# Patient Record
Sex: Female | Born: 1940
Health system: Southern US, Community
[De-identification: ages and names within clinical notes are randomized; demographics above are authoritative.]

## PROBLEM LIST (undated history)

## (undated) DIAGNOSIS — M1990 Unspecified osteoarthritis, unspecified site: Secondary | ICD-10-CM

## (undated) DIAGNOSIS — H269 Unspecified cataract: Secondary | ICD-10-CM

## (undated) DIAGNOSIS — M8580 Other specified disorders of bone density and structure, unspecified site: Secondary | ICD-10-CM

## (undated) DIAGNOSIS — J189 Pneumonia, unspecified organism: Secondary | ICD-10-CM

## (undated) DIAGNOSIS — T8859XA Other complications of anesthesia, initial encounter: Secondary | ICD-10-CM

## (undated) DIAGNOSIS — I10 Essential (primary) hypertension: Secondary | ICD-10-CM

## (undated) DIAGNOSIS — G459 Transient cerebral ischemic attack, unspecified: Secondary | ICD-10-CM

## (undated) DIAGNOSIS — T7840XA Allergy, unspecified, initial encounter: Secondary | ICD-10-CM

## (undated) DIAGNOSIS — Z8669 Personal history of other diseases of the nervous system and sense organs: Secondary | ICD-10-CM

## (undated) DIAGNOSIS — R011 Cardiac murmur, unspecified: Secondary | ICD-10-CM

## (undated) DIAGNOSIS — E785 Hyperlipidemia, unspecified: Secondary | ICD-10-CM

## (undated) DIAGNOSIS — T4145XA Adverse effect of unspecified anesthetic, initial encounter: Secondary | ICD-10-CM

## (undated) HISTORY — DX: Essential (primary) hypertension: I10

## (undated) HISTORY — DX: Unspecified osteoarthritis, unspecified site: M19.90

## (undated) HISTORY — DX: Other specified disorders of bone density and structure, unspecified site: M85.80

## (undated) HISTORY — PX: COLONOSCOPY: SHX174

## (undated) HISTORY — PX: TONSILLECTOMY: SUR1361

## (undated) HISTORY — PX: APPENDECTOMY: SHX54

## (undated) HISTORY — DX: Unspecified cataract: H26.9

## (undated) HISTORY — PX: BACK SURGERY: SHX140

## (undated) HISTORY — DX: Allergy, unspecified, initial encounter: T78.40XA

## (undated) HISTORY — PX: TUBAL LIGATION: SHX77

## (undated) HISTORY — DX: Hyperlipidemia, unspecified: E78.5

## (undated) HISTORY — PX: DILATION AND CURETTAGE OF UTERUS: SHX78

---

## 1973-06-02 HISTORY — PX: LUMBAR DISC SURGERY: SHX700

## 2005-06-20 ENCOUNTER — Ambulatory Visit: Payer: Self-pay | Admitting: Internal Medicine

## 2005-07-04 ENCOUNTER — Emergency Department (HOSPITAL_COMMUNITY): Admission: EM | Admit: 2005-07-04 | Discharge: 2005-07-04 | Payer: Self-pay | Admitting: Emergency Medicine

## 2005-07-10 ENCOUNTER — Ambulatory Visit: Payer: Self-pay | Admitting: Internal Medicine

## 2005-08-05 ENCOUNTER — Ambulatory Visit: Payer: Self-pay | Admitting: Internal Medicine

## 2005-08-05 ENCOUNTER — Ambulatory Visit (HOSPITAL_COMMUNITY): Admission: RE | Admit: 2005-08-05 | Discharge: 2005-08-05 | Payer: Self-pay | Admitting: Internal Medicine

## 2007-03-15 ENCOUNTER — Ambulatory Visit: Payer: Self-pay | Admitting: Gastroenterology

## 2010-10-18 NOTE — Op Note (Signed)
Andrea Moreno, Andrea Moreno             ACCOUNT NO.:  [id number]   MEDICAL RECORD NO.:  [id number]          PATIENT TYPE:  AMB   LOCATION:  DAY                           FACILITY:  [facility]   PHYSICIAN:  R. Roetta Sessions, M.D. DATE OF BIRTH:  1940-07-04   DATE OF PROCEDURE:  08/05/2005  DATE OF DISCHARGE:                                 OPERATIVE REPORT   PROCEDURE:  Screening colonoscopy.   ENDOSCOPIST:  Gerrit Friends. Rourk, M.D.   INDICATIONS FOR PROCEDURE:  The patient is a 70 year old lady who is here  for colorectal cancer screening. It has been 17 years since her last  colonoscopy.  She has no GI symptoms currently.  There is no family history  of colorectal neoplasia.  Colonoscopy is now being done.  This approach has  been discussed with the patient at length.  The potential risks, benefits,  and alternatives have been reviewed; questions answered.  Please see the  documentation in the medical record.   PROCEDURE NOTE:  O2 saturation, blood pressure, pulse and respirations were  monitored throughout the entire procedure.   CONSCIOUS SEDATION:  Versed 6 mg IV, Fentanyl 100 mcg IV in divided doses.  SB prophylaxis:  Gentamicin 100 mg IV, ampicillin 2 gm IV.   INSTRUMENT:  Olympus video chip system.   FINDINGS:  Digital rectal exam revealed no abnormalities.   ENDOSCOPIC FINDINGS:  The prep was adequate.   RECTUM:  Examination of the rectal mucosa including the retroflex view of  the anal verge revealed only a couple of anal papillae.   COLON:  The colonic mucosa was surveyed from the rectosigmoid junction  through the left transverse and right colon to the area of the appendiceal  orifice, ileocecal valve, and cecum.  These structures were well seen and  photographed for the record.   From this level the scope was slowly withdrawn; and all previously mentioned  mucosal surfaces were again seen. The colonic mucosa appeared entirely  normal.  The colon was elongated and tortuous  requiring a number of  maneuvers including changing of the patient's position, external abdominal  pressure, to reach the cecum.   The patient tolerated the procedure well and was reacted in endoscopy.   IMPRESSION:  1.  Anal papillae, otherwise normal rectum.  2.  Long, tortuous, but otherwise normal appearing colon.   RECOMMENDATIONS:  Repeat colonoscopy in 10 years.      Jonathon Bellows, M.D.  Electronically Signed     RMR/MEDQ  D:  08/05/2005  T:  08/06/2005  Job:  [id number]   cc:   Magnus Sinning. Rice, M.D.  Fax: [phone]

## 2010-10-18 NOTE — H&P (Signed)
NAMEANAROSA, KUBISIAK             ACCOUNT NO.:  [id number]   MEDICAL RECORD NO.:  [id number]          PATIENT TYPE:  AMB   LOCATION:  DAY                           FACILITY:  [facility]   PHYSICIAN:  R. Roetta Sessions, M.D. DATE OF BIRTH:  22-Dec-1940   DATE OF ADMISSION:  DATE OF DISCHARGE:  LH                                HISTORY & PHYSICAL   CHIEF COMPLAINT:  Recent gastroenteritis. Overdue for colorectal cancer  screening.   Ms. Braiden Presutti. Arriaga is a 70 year old Caucasian female who was really  doing well from a GI standpoint until the past several weeks when she was  given Z-pack and subsequently Cipro for early pneumonia for which she  subsequently developed nausea, vomiting and nonbloody diarrhea. Stool  cultures revealed pseudomonas aeruginosa in the culture. She was getting  better last week but precipitously got worse with worsening nausea, vomiting  and diarrhea on February 2 which precipitated emergency department visit at  [facility]. She saw Dr. Neale Burly. He worked her up. CT reportedly was  unrevealing for any pathology. She was given some IV fluids and Reglan. Has  had a marked turn around in her symptoms over the past couple of days. She  says she is now back to baseline. Although she feels week, she is having  formed bowel movements. No nausea or vomiting in nearly a week. It is felt  that she may have had antibiotic-associated gastroenteritis early on and  perhaps contracted a different infection such as a Norovirus last week. At  any rate, she is doing much better. She had a colonoscopy in 1990 by Dr.  Andres Ege in [place], [place]. She had no significant findings.  There is no family history of colorectal neoplasia. She is due for  screening.   PAST MEDICAL HISTORY:  1.  Significant for hypertension.  2.  Allergies.  3.  Osteopenia.  4.  Osteoarthritis.  5.  Hyperlipidemia.  6.  History of rheumatoid fever requiring SB prophylaxis for procedures.   PAST SURGICAL HISTORY:  1.  Tonsillectomy.  2.  Appendectomy.  3.  Tubal ligation.  4.  Lumbar surgery.   CURRENT MEDICATIONS:  1.  Vitamin D daily.  2.  Multivitamin daily.  3.  Milk thistle daily.  4.  Triamterene hydrochlorothiazide 37.5/25 mg daily.  5.  Labetalol 200 mg daily.  6.  Red yeast rice 2 tablets daily.   ALLERGIES:  DEMEROL. It causes hypotension.   FAMILY HISTORY:  Mother with renal failure, diabetes and heart disease.  Father died with pneumonia. No history of chronic GI or liver illness.   SOCIAL HISTORY:  The patient is divorced with no children. She retired from  [employer]. She was an occupational therapist. She previously worked  at [employer]. She lives in [place], [place].   She is from [place] and worked in [place] a good part of her life. She  drinks a malt liquor beverage every evening. She does not smoke. No other  history of alcohol or illicit drug use.   REVIEW OF SYSTEMS:  No history of chest pain, dyspnea  on exertion. No fever  or chills. GI symptoms as reviewed above.   PHYSICAL EXAMINATION:  GENERAL:  Reveals a pleasant 71 year old lady resting  comfortably.  VITAL SIGNS:  Weight 160, height 5 foot 2, temperature 97.6, blood pressure  134/82, pulse 60.  SKIN:  Warm and dry. There is no jaundice. No continuous stigmata of chronic  liver disease.  HEENT:  No scleral icterus.  NECK:  JVD is not prominent.  CHEST:  Lungs are clear to auscultation.  CARDIAC:  Regular rate and rhythm without murmurs, gallops, or rubs.  BREASTS:  Deferred.  ABDOMEN:  Nondistended. Positive bowel sounds. Soft, nontender without  appreciable mass or organomegaly.  EXTREMITIES:  No edema.  RECTAL:  Deferred until the time of colonoscopy.   IMPRESSION:  Ms. Adileny Delon. Montrose is a pleasant 70 year old lady with  recent protracted gastrointestinal illness characterized by nausea, vomiting  and diarrhea. I suspect that antibiotic  therapy disturbed her normal flora,  and she ended up with antibiotic-associated illness, exacerbation of  symptoms, prior resolution, last week may well have been a superimposed  viral infection (query Norovirus). At any rate, clinically, she is  significantly improved, and the above-mentioned GI symptoms essentially  resolved. Issue has been raised about screening colonoscopy at 64. Her last  colonoscopy was some 17 years ago. Therefore, she is due, in fact overdue  for a screening colonoscopy. To this end, I have offered Ms. Vonruden a  colonoscopy. Potential risks, benefits, and alternatives have been reviewed.  I would like to give her a few more weeks to fully recover from her recent  protracted illness prior to performing the procedure. Questions were  answered. She is agreeable. We will give her SB prophylactic antibiotics and  keep in mind that she is allergic to Demerol.   I would like to thank Dr. Montey Hora for allowing me to see this nice  lady.      Jonathon Bellows, M.D.  Electronically Signed    RMR/MEDQ  D:  07/10/2005  T:  07/10/2005  Job:  [id number]   cc:   Magnus Sinning. Rice, M.D.  Fax: [phone]

## 2011-09-24 DIAGNOSIS — R609 Edema, unspecified: Secondary | ICD-10-CM | POA: Diagnosis not present

## 2011-09-24 DIAGNOSIS — I10 Essential (primary) hypertension: Secondary | ICD-10-CM | POA: Diagnosis not present

## 2011-09-24 DIAGNOSIS — E785 Hyperlipidemia, unspecified: Secondary | ICD-10-CM | POA: Diagnosis not present

## 2011-10-13 DIAGNOSIS — R7989 Other specified abnormal findings of blood chemistry: Secondary | ICD-10-CM | POA: Diagnosis not present

## 2011-10-13 DIAGNOSIS — I10 Essential (primary) hypertension: Secondary | ICD-10-CM | POA: Diagnosis not present

## 2011-12-16 DIAGNOSIS — M6530 Trigger finger, unspecified finger: Secondary | ICD-10-CM | POA: Diagnosis not present

## 2012-01-13 DIAGNOSIS — M6530 Trigger finger, unspecified finger: Secondary | ICD-10-CM | POA: Diagnosis not present

## 2012-02-04 DIAGNOSIS — I10 Essential (primary) hypertension: Secondary | ICD-10-CM | POA: Diagnosis not present

## 2012-02-04 DIAGNOSIS — R5383 Other fatigue: Secondary | ICD-10-CM | POA: Diagnosis not present

## 2012-02-04 DIAGNOSIS — E559 Vitamin D deficiency, unspecified: Secondary | ICD-10-CM | POA: Diagnosis not present

## 2012-02-04 DIAGNOSIS — E785 Hyperlipidemia, unspecified: Secondary | ICD-10-CM | POA: Diagnosis not present

## 2012-02-04 DIAGNOSIS — R5381 Other malaise: Secondary | ICD-10-CM | POA: Diagnosis not present

## 2012-02-16 DIAGNOSIS — E785 Hyperlipidemia, unspecified: Secondary | ICD-10-CM | POA: Diagnosis not present

## 2012-02-16 DIAGNOSIS — M25559 Pain in unspecified hip: Secondary | ICD-10-CM | POA: Diagnosis not present

## 2012-03-15 DIAGNOSIS — Z23 Encounter for immunization: Secondary | ICD-10-CM | POA: Diagnosis not present

## 2012-04-12 DIAGNOSIS — M25559 Pain in unspecified hip: Secondary | ICD-10-CM | POA: Diagnosis not present

## 2012-04-12 DIAGNOSIS — M5137 Other intervertebral disc degeneration, lumbosacral region: Secondary | ICD-10-CM | POA: Diagnosis not present

## 2012-04-12 DIAGNOSIS — M19049 Primary osteoarthritis, unspecified hand: Secondary | ICD-10-CM | POA: Diagnosis not present

## 2012-04-12 DIAGNOSIS — M51379 Other intervertebral disc degeneration, lumbosacral region without mention of lumbar back pain or lower extremity pain: Secondary | ICD-10-CM | POA: Diagnosis not present

## 2012-04-20 DIAGNOSIS — M25559 Pain in unspecified hip: Secondary | ICD-10-CM | POA: Diagnosis not present

## 2012-04-20 DIAGNOSIS — M545 Low back pain: Secondary | ICD-10-CM | POA: Diagnosis not present

## 2012-04-23 DIAGNOSIS — M25559 Pain in unspecified hip: Secondary | ICD-10-CM | POA: Diagnosis not present

## 2012-04-23 DIAGNOSIS — M545 Low back pain: Secondary | ICD-10-CM | POA: Diagnosis not present

## 2012-04-26 DIAGNOSIS — M25559 Pain in unspecified hip: Secondary | ICD-10-CM | POA: Diagnosis not present

## 2012-04-26 DIAGNOSIS — M545 Low back pain: Secondary | ICD-10-CM | POA: Diagnosis not present

## 2012-04-28 DIAGNOSIS — M545 Low back pain: Secondary | ICD-10-CM | POA: Diagnosis not present

## 2012-04-28 DIAGNOSIS — H2510 Age-related nuclear cataract, unspecified eye: Secondary | ICD-10-CM | POA: Diagnosis not present

## 2012-04-28 DIAGNOSIS — H538 Other visual disturbances: Secondary | ICD-10-CM | POA: Diagnosis not present

## 2012-04-28 DIAGNOSIS — M25559 Pain in unspecified hip: Secondary | ICD-10-CM | POA: Diagnosis not present

## 2012-05-05 DIAGNOSIS — M25559 Pain in unspecified hip: Secondary | ICD-10-CM | POA: Diagnosis not present

## 2012-05-05 DIAGNOSIS — M545 Low back pain: Secondary | ICD-10-CM | POA: Diagnosis not present

## 2012-05-07 DIAGNOSIS — M545 Low back pain: Secondary | ICD-10-CM | POA: Diagnosis not present

## 2012-05-07 DIAGNOSIS — M25559 Pain in unspecified hip: Secondary | ICD-10-CM | POA: Diagnosis not present

## 2012-05-10 DIAGNOSIS — M25559 Pain in unspecified hip: Secondary | ICD-10-CM | POA: Diagnosis not present

## 2012-05-10 DIAGNOSIS — M47817 Spondylosis without myelopathy or radiculopathy, lumbosacral region: Secondary | ICD-10-CM | POA: Diagnosis not present

## 2012-06-11 DIAGNOSIS — E785 Hyperlipidemia, unspecified: Secondary | ICD-10-CM | POA: Diagnosis not present

## 2012-06-11 DIAGNOSIS — I10 Essential (primary) hypertension: Secondary | ICD-10-CM | POA: Diagnosis not present

## 2012-06-22 DIAGNOSIS — I10 Essential (primary) hypertension: Secondary | ICD-10-CM | POA: Diagnosis not present

## 2012-06-22 DIAGNOSIS — E785 Hyperlipidemia, unspecified: Secondary | ICD-10-CM | POA: Diagnosis not present

## 2012-06-23 DIAGNOSIS — M899 Disorder of bone, unspecified: Secondary | ICD-10-CM | POA: Diagnosis not present

## 2012-06-23 DIAGNOSIS — M949 Disorder of cartilage, unspecified: Secondary | ICD-10-CM | POA: Diagnosis not present

## 2012-07-12 DIAGNOSIS — M19049 Primary osteoarthritis, unspecified hand: Secondary | ICD-10-CM | POA: Diagnosis not present

## 2012-07-12 DIAGNOSIS — M5137 Other intervertebral disc degeneration, lumbosacral region: Secondary | ICD-10-CM | POA: Diagnosis not present

## 2012-09-21 ENCOUNTER — Other Ambulatory Visit: Payer: Self-pay

## 2012-09-21 MED ORDER — LOSARTAN POTASSIUM-HCTZ 100-25 MG PO TABS: 1.0000 | ORAL_TABLET | Freq: Every day | ORAL | Status: DC

## 2012-10-14 ENCOUNTER — Encounter (HOSPITAL_COMMUNITY): Payer: Self-pay

## 2012-10-14 ENCOUNTER — Ambulatory Visit (INDEPENDENT_AMBULATORY_CARE_PROVIDER_SITE_OTHER): Payer: Medicare Other | Admitting: Family Medicine

## 2012-10-14 ENCOUNTER — Observation Stay (HOSPITAL_COMMUNITY)
Admission: EM | Admit: 2012-10-14 | Discharge: 2012-10-15 | Disposition: A | Discharge status: Home / Self Care | Payer: Medicare Other | Attending: Internal Medicine | Admitting: Internal Medicine

## 2012-10-14 ENCOUNTER — Emergency Department (HOSPITAL_COMMUNITY): Payer: Medicare Other

## 2012-10-14 VITALS — BP 170/94 | Temp 98.1°F | Ht 61.0 in | Wt 167.0 lb

## 2012-10-14 DIAGNOSIS — I10 Essential (primary) hypertension: Secondary | ICD-10-CM | POA: Diagnosis not present

## 2012-10-14 DIAGNOSIS — R29898 Other symptoms and signs involving the musculoskeletal system: Secondary | ICD-10-CM

## 2012-10-14 DIAGNOSIS — E785 Hyperlipidemia, unspecified: Secondary | ICD-10-CM | POA: Diagnosis not present

## 2012-10-14 DIAGNOSIS — Z79899 Other long term (current) drug therapy: Secondary | ICD-10-CM | POA: Diagnosis not present

## 2012-10-14 DIAGNOSIS — M810 Age-related osteoporosis without current pathological fracture: Secondary | ICD-10-CM | POA: Insufficient documentation

## 2012-10-14 DIAGNOSIS — R209 Unspecified disturbances of skin sensation: Secondary | ICD-10-CM | POA: Diagnosis not present

## 2012-10-14 DIAGNOSIS — G8190 Hemiplegia, unspecified affecting unspecified side: Secondary | ICD-10-CM

## 2012-10-14 DIAGNOSIS — Z8673 Personal history of transient ischemic attack (TIA), and cerebral infarction without residual deficits: Secondary | ICD-10-CM | POA: Insufficient documentation

## 2012-10-14 DIAGNOSIS — I6789 Other cerebrovascular disease: Secondary | ICD-10-CM | POA: Diagnosis not present

## 2012-10-14 DIAGNOSIS — G459 Transient cerebral ischemic attack, unspecified: Secondary | ICD-10-CM | POA: Diagnosis not present

## 2012-10-14 DIAGNOSIS — D72819 Decreased white blood cell count, unspecified: Secondary | ICD-10-CM | POA: Diagnosis not present

## 2012-10-14 DIAGNOSIS — Z23 Encounter for immunization: Secondary | ICD-10-CM | POA: Diagnosis not present

## 2012-10-14 DIAGNOSIS — M1990 Unspecified osteoarthritis, unspecified site: Secondary | ICD-10-CM | POA: Diagnosis not present

## 2012-10-14 DIAGNOSIS — G8191 Hemiplegia, unspecified affecting right dominant side: Secondary | ICD-10-CM

## 2012-10-14 HISTORY — DX: Pneumonia, unspecified organism: J18.9

## 2012-10-14 HISTORY — DX: Transient cerebral ischemic attack, unspecified: G45.9

## 2012-10-14 HISTORY — DX: Other complications of anesthesia, initial encounter: T88.59XA

## 2012-10-14 HISTORY — DX: Cardiac murmur, unspecified: R01.1

## 2012-10-14 HISTORY — DX: Adverse effect of unspecified anesthetic, initial encounter: T41.45XA

## 2012-10-14 LAB — CBC WITH DIFFERENTIAL/PLATELET
Basophils Absolute: 0 10*3/uL (ref 0.0–0.1)
Basophils Relative: 1 % (ref 0–1)
Eosinophils Absolute: 0.2 10*3/uL (ref 0.0–0.7)
Hemoglobin: 12.1 g/dL (ref 12.0–15.0)
MCH: 30.5 pg (ref 26.0–34.0)
Monocytes Absolute: 0.2 10*3/uL (ref 0.1–1.0)
Monocytes Relative: 7 % (ref 3–12)
RBC: 3.97 MIL/uL (ref 3.87–5.11)
WBC: 3.3 10*3/uL — ABNORMAL LOW (ref 4.0–10.5)

## 2012-10-14 LAB — BASIC METABOLIC PANEL
CO2: 26 mEq/L (ref 19–32)
Chloride: 106 mEq/L (ref 96–112)
Creatinine, Ser: 0.75 mg/dL (ref 0.50–1.10)
GFR calc Af Amer: >90 mL/min (ref >90)
GFR calc non Af Amer: 83 mL/min — ABNORMAL LOW (ref >90)
Sodium: 140 mEq/L (ref 135–145)

## 2012-10-14 MED ORDER — SODIUM CHLORIDE 0.9 % IV SOLN: INTRAVENOUS | Status: DC

## 2012-10-14 MED ORDER — DICLOFENAC SODIUM 75 MG PO TBEC
75.0000 mg | DELAYED_RELEASE_TABLET | Freq: Two times a day (BID) | ORAL | Status: DC | PRN
  Filled 2012-10-14: qty 1

## 2012-10-14 MED ORDER — ONDANSETRON HCL 4 MG PO TABS: 4.0000 mg | ORAL_TABLET | Freq: Four times a day (QID) | ORAL | Status: DC | PRN

## 2012-10-14 MED ORDER — OMEGA-3 FATTY ACIDS 1000 MG PO CAPS: 1.0000 g | ORAL_CAPSULE | Freq: Two times a day (BID) | ORAL | Status: DC

## 2012-10-14 MED ORDER — SODIUM CHLORIDE 0.9 % IJ SOLN
3.0000 mL | Freq: Two times a day (BID) | INTRAMUSCULAR | Status: DC
  Administered 2012-10-14 – 2012-10-15 (×2): 3 mL via INTRAVENOUS

## 2012-10-14 MED ORDER — ASPIRIN EC 325 MG PO TBEC
325.0000 mg | DELAYED_RELEASE_TABLET | Freq: Every day | ORAL | Status: DC
  Administered 2012-10-14 – 2012-10-15 (×2): 325 mg via ORAL
  Filled 2012-10-14 (×2): qty 1

## 2012-10-14 MED ORDER — ENOXAPARIN SODIUM 40 MG/0.4ML ~~LOC~~ SOLN
40.0000 mg | SUBCUTANEOUS | Status: DC
  Administered 2012-10-14: 40 mg via SUBCUTANEOUS
  Filled 2012-10-14 (×2): qty 0.4

## 2012-10-14 MED ORDER — LORAZEPAM 1 MG PO TABS: 1.0000 mg | ORAL_TABLET | Freq: Once | ORAL | Status: DC

## 2012-10-14 MED ORDER — ONDANSETRON HCL 4 MG/2ML IJ SOLN: 4.0000 mg | Freq: Four times a day (QID) | INTRAMUSCULAR | Status: DC | PRN

## 2012-10-14 MED ORDER — CALCIUM CARBONATE ANTACID 500 MG PO CHEW
1.0000 | CHEWABLE_TABLET | Freq: Every day | ORAL | Status: DC
  Filled 2012-10-14 (×2): qty 1

## 2012-10-14 MED ORDER — OMEGA-3-ACID ETHYL ESTERS 1 G PO CAPS
1.0000 g | ORAL_CAPSULE | Freq: Two times a day (BID) | ORAL | Status: DC
  Filled 2012-10-14 (×3): qty 1

## 2012-10-14 MED ORDER — PNEUMOCOCCAL VAC POLYVALENT 25 MCG/0.5ML IJ INJ
0.5000 mL | INJECTION | INTRAMUSCULAR | Status: AC
  Administered 2012-10-15: 0.5 mL via INTRAMUSCULAR
  Filled 2012-10-14: qty 0.5

## 2012-10-14 MED ORDER — EZETIMIBE 10 MG PO TABS
10.0000 mg | ORAL_TABLET | Freq: Every day | ORAL | Status: DC
  Administered 2012-10-15: 10 mg via ORAL
  Filled 2012-10-14 (×3): qty 1

## 2012-10-14 MED ORDER — ACETAMINOPHEN 325 MG PO TABS: 650.0000 mg | ORAL_TABLET | Freq: Four times a day (QID) | ORAL | Status: DC | PRN

## 2012-10-14 MED ORDER — CYCLOBENZAPRINE HCL 5 MG PO TABS
5.0000 mg | ORAL_TABLET | Freq: Every day | ORAL | Status: DC
  Administered 2012-10-14: 5 mg via ORAL
  Filled 2012-10-14 (×2): qty 1

## 2012-10-14 MED ORDER — OXYCODONE HCL 5 MG PO TABS: 5.0000 mg | ORAL_TABLET | ORAL | Status: DC | PRN

## 2012-10-14 MED ORDER — ACETAMINOPHEN 650 MG RE SUPP: 650.0000 mg | Freq: Four times a day (QID) | RECTAL | Status: DC | PRN

## 2012-10-14 NOTE — Progress Notes (Signed)
Patient ID: Andrea Moreno, female   DOB: 03/21/1941, 72 y.o.   MRN: [id number] SUBJECTIVE: HPI: 61+ year old woman with h/o : Hypertension,hyperlipidemia,osteoarthritis. Went into kitchen to make breakfast at approximately 7:45 am to make breakfast and the left leg went suddenly weak and funny and almost fell. Someone had to put a chair behind her and after approximately 5 minutes her leg improved. The only residual symptom is a foggy light headed feeling in her head. No difficulty swallowing, no slurred speech, no headache. Patient came to the clinic to get checked because she thought that this is a TIA. She never had this before. First time  Ever. No Back pain.  Past Medical History  Diagnosis Date  . Hypertension   . Allergy   . Osteopenia   . Arthritis   . Hyperlipidemia    Past Surgical History  Procedure Laterality Date  . Appendectomy    . Tonsillectomy    . Back surgery     History   Social History  . Marital Status: Divorced    Spouse Name: N/A    Number of Children: N/A  . Years of Education: N/A   Occupational History  . Not on file.   Social History Main Topics  . Smoking status: Former Games developer  . Smokeless tobacco: Never Used  . Alcohol Use: No  . Drug Use: No  . Sexually Active: Not on file   Other Topics Concern  . Not on file   Social History Narrative  . No narrative on file   Family History  Problem Relation Age of Onset  . Heart disease Mother   . Diabetes Mother    Current Outpatient Prescriptions on File Prior to Visit  Medication Sig Dispense Refill  . losartan-hydrochlorothiazide (HYZAAR) 100-25 MG per tablet Take 1 tablet by mouth daily.  30 tablet  2   No current facility-administered medications on file prior to visit.   Allergies  Allergen Reactions  . Demerol (Meperidine)     There is no immunization history on file for this patient. Prior to Admission medications   Medication Sig Start Date End Date Taking? Authorizing  Provider  alendronate (FOSAMAX) 70 MG tablet Take 70 mg by mouth every 7 (seven) days. Take with a full glass of water on an empty stomach.   Yes Historical Provider, MD  calcium carbonate (TUMS - DOSED IN MG ELEMENTAL CALCIUM) 500 MG chewable tablet Chew 1 tablet by mouth daily.   Yes Historical Provider, MD  cetirizine (ZYRTEC) 10 MG tablet Take 10 mg by mouth daily.   Yes Historical Provider, MD  cholecalciferol (VITAMIN D) 400 UNITS TABS Take 400 Units by mouth daily.   Yes Historical Provider, MD  cyclobenzaprine (FLEXERIL) 5 MG tablet Take 5 mg by mouth 3 (three) times daily as needed for muscle spasms.   Yes Historical Provider, MD  diclofenac (VOLTAREN) 75 MG EC tablet Take 75 mg by mouth 2 (two) times daily. USES PRN   Yes Historical Provider, MD  ezetimibe (ZETIA) 10 MG tablet Take 10 mg by mouth daily.   Yes Historical Provider, MD  fish oil-omega-3 fatty acids 1000 MG capsule Take 2 g by mouth daily.   Yes Historical Provider, MD  Milk Thistle 1000 MG CAPS Take by mouth.   Yes Historical Provider, MD  TURMERIC PO Take by mouth.   Yes Historical Provider, MD  losartan-hydrochlorothiazide (HYZAAR) 100-25 MG per tablet Take 1 tablet by mouth daily. 09/21/12   Ernestina Penna, MD  milk thistle 175 MG tablet Take by mouth daily.    Historical Provider, MD    ROS: As above in the HPI. All other systems are stable or negative.  OBJECTIVE: APPEARANCE:  Patient in no acute distress.The patient appeared well nourished and normally developed. Acyanotic. Waist: VITAL SIGNS:BP 170/94  Temp(Src) 98.1 F (36.7 C) (Oral)  Ht 5\' 1"  (1.549 m)  Wt 167 lb (75.751 kg)  BMI 31.57 kg/m2  SpO2 96% Ambulates with a limp from her arthritis in the right hip. Speech clear.  SKIN: warm and  Dry without overt rashes, tattoos and scars  HEAD and Neck: without JVD, Head and scalp: normal Eyes:No scleral icterus. Fundi normal, eye movements normal. Ears: Auricle normal, canal normal, Tympanic membranes  normal, insufflation normal. Nose: normal Throat: normal Neck & thyroid: normal  CHEST & LUNGS: Chest wall: normal Lungs: Clear  CVS: Reveals the PMI to be normally located. Regular rhythm, First and Second Heart sounds are normal,  absence of murmurs, rubs or gallops. Peripheral vasculature: Radial pulses: normal Dorsal pedis pulses: normal Posterior pulses: normal  ABDOMEN:  Appearance: overweighytBenign,, no organomegaly, no masses, no Abdominal Aortic enlargement. No Guarding , no rebound. No Bruits. Bowel sounds: normal  RECTAL: N/A GU: N/A  EXTREMETIES: nonedematous.  MUSCULOSKELETAL:  Spine: normal Joints:right hip reduced ROM with discomfort.  NEUROLOGIC: oriented to time,place and person; nonfocal. Strength is normal Reflexes are normal Cranial Nerves are normal.  ASSESSMENT: 11:00 am Acute left leg weakness. R/O Cerebrovascular event. re Weakness of right leg  HTN (hypertension)  SBP high  PLAN: Called [facility] ED spoke to Press photographer. Transferred to Hospital via  Ambulance for  Further evaluation and treatment. No orders of the defined types were placed in this encounter.   O2 started via [place] IV attempted. EMS called.  Amry Cathy P. Modesto Charon, M.D.

## 2012-10-14 NOTE — ED Notes (Signed)
Pt states she is starting to get a headache because she has not eaten today. Informed pt she would have to wait for results to return before she is able to eat. Pt voiced understanding. States "I definitely don't want a pill for it. Just food."

## 2012-10-14 NOTE — ED Notes (Signed)
Per ems- pt from md office, at 0745 pt had left leg numbness and weakness, after 5 minutes all symptoms resolved. Pt pcp sent pt over to r/o tia. NAD noted. No neuro deficits at this time per ems. Bp-158/78 Hr-60, regular, NSR, R-18 O2-97% on RA.

## 2012-10-14 NOTE — ED Notes (Signed)
MD at bedside, will take pt to 3W when consult is over.

## 2012-10-14 NOTE — H&P (Signed)
[facility] History and Physical  TAJAI SUDER ZOX:[id number] DOB: 1940/06/20 DOA: 10/14/2012   PCP: Redmond Baseman, MD   Chief Complaint:  Right leg weakness HPI:  72 year old female with a history of hypertension, osteoporosis, hyperlipidemia, osteoarthritis presents with right leg weakness that started on the patient was fixing breakfast this morning. The patient described it as her right foot feeling heavy to the point where she was unable to lift it. The patient was standing at the time. She did not fall nor was there any syncope. This has never occurred in the past. She states the episode lasted 5 minutes with complete resolution of her symptoms. She has not had any recurrence of her symptoms since this morning. She denied any headache, visual disturbance, dysarthria, any other sensory disturbance or other extremity weakness. The patient denies any fevers, chills, chest pain, shortness of breath, nausea, vomiting, diarrhea, dizziness. The patient went to see her primary care provider whom told her to come to the emergency department for further evaluation. The patient denies states that she fell back from a chair and hit her back on Tuesday, 2 days ago. However she denies any worsening back pain. She does complain of chronic osteoarthritis associated with lumbar back pain for which she takes Voltaren. The patient has a remote history of tobacco use, quit in 1987. She smoked one third pack per day x5 years.  In the emergency department, BMP was essentially unremarkable. CBC showed mild leukopenia with a WBC 3.3. EKG was sinus rhythm without any ST-T changes. Vitals were stable. CT of the brain was negative. Assessment/Plan:  Right leg hemiparesis -Concerned about TIA versus stroke -MRI brain -Check TSH, hemoglobin A1c, lipid panel -Start patient on aspirin -Neurology was consulted from the emergency department and they will follow the patient -Given patient's history of  claustrophobia, will premedicate with Ativan prior to MRI Hypertension -Will allow for permissive hypertension pending MRI results -Hold the patient's normal dose of losartan-hydrochlorothiazide Leukopenia -Patient states that this is chronic -She has had a previous workup from hematology which was negative Hyperlipidemia -Continue Zetia -Check lipid panel       Past Medical History  Diagnosis Date  . Hypertension   . Allergy   . Osteopenia   . Arthritis   . Hyperlipidemia    Past Surgical History  Procedure Laterality Date  . Appendectomy    . Tonsillectomy    . Back surgery     Social History:  reports that she has quit smoking. She has never used smokeless tobacco. She reports that she does not drink alcohol or use illicit drugs.   Family History  Problem Relation Age of Onset  . Heart disease Mother   . Diabetes Mother      Allergies  Allergen Reactions  . Demerol (Meperidine)       Prior to Admission medications   Medication Sig Start Date End Date Taking? Authorizing Provider  alendronate (FOSAMAX) 70 MG tablet Take 70 mg by mouth every 7 (seven) days. Sundays only   Yes Historical Provider, MD  calcium carbonate (TUMS - DOSED IN MG ELEMENTAL CALCIUM) 500 MG chewable tablet Chew 1 tablet by mouth daily.   Yes Historical Provider, MD  cetirizine (ZYRTEC) 10 MG tablet Take 10 mg by mouth daily.   Yes Historical Provider, MD  cholecalciferol (VITAMIN D) 400 UNITS TABS Take 400-800 Units by mouth daily. Tues, Thurs, Sat-2 tabs daily, all other days 1 tab   Yes Historical Provider, MD  cyclobenzaprine (FLEXERIL) 5  MG tablet Take 5 mg by mouth at bedtime.    Yes Historical Provider, MD  diclofenac (VOLTAREN) 75 MG EC tablet Take 75 mg by mouth 2 (two) times daily. USES PRN   Yes Historical Provider, MD  ezetimibe (ZETIA) 10 MG tablet Take 10 mg by mouth daily.   Yes Historical Provider, MD  fish oil-omega-3 fatty acids 1000 MG capsule Take 1 g by mouth 2 (two)  times daily.    Yes Historical Provider, MD  losartan-hydrochlorothiazide (HYZAAR) 100-25 MG per tablet Take 1 tablet by mouth daily. 09/21/12  Yes Ernestina Penna, MD  Milk Thistle 1000 MG CAPS Take 1,000 mg by mouth at bedtime.    Yes Historical Provider, MD  OVER THE COUNTER MEDICATION Take 1 capsule by mouth 3 (three) times daily. "Replenex"   Yes Historical Provider, MD  TURMERIC PO Take 450 mg by mouth 2 (two) times daily.    Yes Historical Provider, MD    Review of Systems:  Constitutional:  No weight loss, night sweats, Fevers, chills, fatigue.  Head&Eyes: No headache.  No vision loss.  No eye pain or scotoma ENT:  No Difficulty swallowing,Tooth/dental problems,Sore throat,  No ear ache, post nasal drip,  Cardio-vascular:  No chest pain, Orthopnea, PND, swelling in lower extremities,  dizziness, palpitations  GI:  No  abdominal pain, nausea, vomiting, diarrhea, loss of appetite, hematochezia, melena, heartburn, indigestion, Resp:  No shortness of breath with exertion or at rest. No cough. No coughing up of blood .No wheezing.No chest wall deformity  Skin:  no rash or lesions.  GU:  no dysuria, change in color of urine, no urgency or frequency. No flank pain.  Musculoskeletal:  No joint pain or swelling. No decreased range of motion. No back pain.  Psych:  No change in mood or affect. No depression or anxiety. Neurologic: No headache, no dysesthesia, no focal weakness, no vision loss. No syncope--complains of right leg weakness lasting 5 minutes   Physical Exam: Filed Vitals:   10/14/12 1243 10/14/12 1422 10/14/12 1500 10/14/12 1600  BP: 159/66 148/55 157/70 145/56  Pulse: 58 99 62 68  Temp: 99 F (37.2 C) 98.6 F (37 C)    TempSrc: Oral Oral    Resp: 22 21 20 16   SpO2: 99% 100% 99% 98%   General:  A&O x 3, NAD, nontoxic, pleasant/cooperative Head/Eye: No conjunctival hemorrhage, no icterus, [place]/AT, No nystagmus ENT:  No icterus,  No thrush, good dentition, no  pharyngeal exudate Neck:  No masses, no lymphadenpathy, no bruits CV:  RRR, no rub, no gallop, no S3 Lung:  CTAB, good air movement, no wheeze, no rhonchi Abdomen: soft/NT, +BS, nondistended, no peritoneal signs Ext: No cyanosis, No rashes, No petechiae, No lymphangitis, No edema Neuro: CNII-XII intact, strength 4/5 in bilateral upper and lower extremities, no dysmetria; sensation to light touch and epicritic stimuli was intact bilaterally.   Labs on Admission:  Basic Metabolic Panel:  Recent Labs Lab 10/14/12 1359  NA 140  K 3.8  CL 106  CO2 26  GLUCOSE 102*  BUN 23  CREATININE 0.75  CALCIUM 9.2   Liver Function Tests: No results found for this basename: AST, ALT, ALKPHOS, BILITOT, PROT, ALBUMIN,  in the last 168 hours No results found for this basename: LIPASE, AMYLASE,  in the last 168 hours No results found for this basename: AMMONIA,  in the last 168 hours CBC:  Recent Labs Lab 10/14/12 1359  WBC 3.3*  NEUTROABS 1.7  HGB 12.1  HCT 35.7*  MCV 89.9  PLT 171   Cardiac Enzymes: No results found for this basename: CKTOTAL, CKMB, CKMBINDEX, TROPONINI,  in the last 168 hours BNP: No components found with this basename: POCBNP,  CBG: No results found for this basename: GLUCAP,  in the last 168 hours  Radiological Exams on Admission: Ct Head Wo Contrast  10/14/2012   *RADIOLOGY REPORT*  Clinical Data: Transient left lower extremity numbness and weakness  CT HEAD WITHOUT CONTRAST  Technique:  Contiguous axial images were obtained from the base of the skull through the vertex without contrast.  Comparison: None.  Findings:  There is very mild, likely age appropriate, diffuse volume loss. Old lacunar infarct within the right basal ganglia (14, series 2). Scattered very minimal periventricular hypodensities most conspicuous about the bilateral occipital horns, compatible microvascular ischemic disease.  No CT evidence of acute large territory infarct.  No intraparenchymal or  extra-axial mass or hemorrhage.  Limited visualization of the paranasal sinuses and mastoid air cells are normal.  Regional soft tissues are normal. No displaced calvarial fracture.  IMPRESSION: 1.  No acute intracranial process. 2.  Mild volume loss and microvascular ischemic disease.   Original Report Authenticated By: Tacey Ruiz, MD    EKG: Independently reviewed. Sinus rhythm, no ST to T wave change   Time spent:60 minutes Code Status:   FULL Family Communication:  No  Family at bedside   Armandina Iman, DO  [facility] Pager [phone]  If 7PM-7AM, please contact night-coverage [email or web address] Password [facility] 10/14/2012, 5:28 PM

## 2012-10-14 NOTE — ED Provider Notes (Signed)
History     CSN: [id number]  Arrival date & time 10/14/12  1237   First MD Initiated Contact with Patient 10/14/12 1241      Chief Complaint  Patient presents with  . Numbness    (Consider location/radiation/quality/duration/timing/severity/associated sxs/prior treatment) HPI..... fleeting episode of left leg numbness at approximately 745 this morning    Symptoms lasted approximately 5 minutes. She is now completely improved.    No other neurological deficits. Patient alert and oriented x3.  No radiation.   Severity is moderate  Past Medical History  Diagnosis Date  . Hypertension   . Allergy   . Osteopenia   . Arthritis   . Hyperlipidemia     Past Surgical History  Procedure Laterality Date  . Appendectomy    . Tonsillectomy    . Back surgery      Family History  Problem Relation Age of Onset  . Heart disease Mother   . Diabetes Mother     History  Substance Use Topics  . Smoking status: Former Games developer  . Smokeless tobacco: Never Used  . Alcohol Use: No    OB History   Grav Para Term Preterm Abortions TAB SAB Ect Mult Living                  Review of Systems  All other systems reviewed and are negative.    Allergies  Demerol  Home Medications   Current Outpatient Rx  Name  Route  Sig  Dispense  Refill  . alendronate (FOSAMAX) 70 MG tablet   Oral   Take 70 mg by mouth every 7 (seven) days. Take with a full glass of water on an empty stomach.         . calcium carbonate (TUMS - DOSED IN MG ELEMENTAL CALCIUM) 500 MG chewable tablet   Oral   Chew 1 tablet by mouth daily.         . cetirizine (ZYRTEC) 10 MG tablet   Oral   Take 10 mg by mouth daily.         . cholecalciferol (VITAMIN D) 400 UNITS TABS   Oral   Take 400 Units by mouth daily.         . cyclobenzaprine (FLEXERIL) 5 MG tablet   Oral   Take 5 mg by mouth 3 (three) times daily as needed for muscle spasms.         . diclofenac (VOLTAREN) 75 MG EC tablet   Oral  Take 75 mg by mouth 2 (two) times daily. USES PRN         . ezetimibe (ZETIA) 10 MG tablet   Oral   Take 10 mg by mouth daily.         . fish oil-omega-3 fatty acids 1000 MG capsule   Oral   Take 2 g by mouth daily.         Marland Kitchen losartan-hydrochlorothiazide (HYZAAR) 100-25 MG per tablet   Oral   Take 1 tablet by mouth daily.   30 tablet   2   . Milk Thistle 1000 MG CAPS   Oral   Take by mouth.         . milk thistle 175 MG tablet   Oral   Take by mouth daily.         . TURMERIC PO   Oral   Take by mouth.           BP 159/66  Pulse 58  Temp(Src) 99  F (37.2 C) (Oral)  Resp 22  SpO2 99%  Physical Exam  Nursing note and vitals reviewed. Constitutional: She is oriented to person, place, and time. She appears well-developed and well-nourished.  HENT:  Head: Normocephalic and atraumatic.  Eyes: Conjunctivae and EOM are normal. Pupils are equal, round, and reactive to light.  Neck: Normal range of motion. Neck supple.  Cardiovascular: Normal rate, regular rhythm and normal heart sounds.   Pulmonary/Chest: Effort normal and breath sounds normal.  Abdominal: Soft. Bowel sounds are normal.  Musculoskeletal: Normal range of motion.  Neurological: She is alert and oriented to person, place, and time.  Skin: Skin is warm and dry.  Psychiatric: She has a normal mood and affect.    ED Course  Procedures (including critical care time)  Labs Reviewed - No data to display No results found. Results for orders placed during the hospital encounter of 10/14/12  BASIC METABOLIC PANEL      Result Value Range   Sodium 140  135 - 145 mEq/L   Potassium 3.8  3.5 - 5.1 mEq/L   Chloride 106  96 - 112 mEq/L   CO2 26  19 - 32 mEq/L   Glucose, Bld 102 (*) 70 - 99 mg/dL   BUN 23  6 - 23 mg/dL   Creatinine, Ser 0.45  0.50 - 1.10 mg/dL   Calcium 9.2  8.4 - 40.9 mg/dL   GFR calc non Af Amer 83 (*) >90 mL/min   GFR calc Af Amer >90  >90 mL/min  CBC WITH DIFFERENTIAL       Result Value Range   WBC 3.3 (*) 4.0 - 10.5 K/uL   RBC 3.97  3.87 - 5.11 MIL/uL   Hemoglobin 12.1  12.0 - 15.0 g/dL   HCT 81.1 (*) 91.4 - 78.2 %   MCV 89.9  78.0 - 100.0 fL   MCH 30.5  26.0 - 34.0 pg   MCHC 33.9  30.0 - 36.0 g/dL   RDW 95.6  21.3 - 08.6 %   Platelets 171  150 - 400 K/uL   Neutrophils Relative % 53  43 - 77 %   Neutro Abs 1.7  1.7 - 7.7 K/uL   Lymphocytes Relative 34  12 - 46 %   Lymphs Abs 1.1  0.7 - 4.0 K/uL   Monocytes Relative 7  3 - 12 %   Monocytes Absolute 0.2  0.1 - 1.0 K/uL   Eosinophils Relative 5  0 - 5 %   Eosinophils Absolute 0.2  0.0 - 0.7 K/uL   Basophils Relative 1  0 - 1 %   Basophils Absolute 0.0  0.0 - 0.1 K/uL    Ct Head Wo Contrast  10/14/2012   *RADIOLOGY REPORT*  Clinical Data: Transient left lower extremity numbness and weakness  CT HEAD WITHOUT CONTRAST  Technique:  Contiguous axial images were obtained from the base of the skull through the vertex without contrast.  Comparison: None.  Findings:  There is very mild, likely age appropriate, diffuse volume loss. Old lacunar infarct within the right basal ganglia (14, series 2). Scattered very minimal periventricular hypodensities most conspicuous about the bilateral occipital horns, compatible microvascular ischemic disease.  No CT evidence of acute large territory infarct.  No intraparenchymal or extra-axial mass or hemorrhage.  Limited visualization of the paranasal sinuses and mastoid air cells are normal.  Regional soft tissues are normal. No displaced calvarial fracture.  IMPRESSION: 1.  No acute intracranial process. 2.  Mild volume loss  and microvascular ischemic disease.   Original Report Authenticated By: Tacey Ruiz, MD    No diagnosis found.    Date: 10/14/2012  Rate: 57  Rhythm: sinus brady  QRS Axis: normal  Intervals: normal  ST/T Wave abnormalities: normal  Conduction Disutrbances: none  Narrative Interpretation: unremarkable     MDM  History and physical consistent with  TIA. CT head negative. Physical exam normal.  Discussed with neurologist on call. Admit to general medicine         Donnetta Hutching, MD 10/14/12 [phone]

## 2012-10-14 NOTE — ED Notes (Addendum)
Attempted Report at 44

## 2012-10-14 NOTE — Consult Note (Signed)
Referring Physician: Dr. Arbutus Leas    Chief Complaint: Transient left lower extremity weakness.  HPI: Andrea Moreno is an 72 y.o. female with a history of hypertension, hyperlipidemia, arthritis and osteopenia who experienced an episode of weakness involving the right lower extremity which lasted about 5 minutes this morning. Onset was at 7:45 AM. She had no associated left upper extremity weakness nor speech abnormality. She has no previous history stroke nor TIA. She has not been on antiplatelet therapy. CT scan of her head showed no acute intracranial abnormality. Patient's NIH stroke score was 0.  LSN: 7:45 AM on 10/14/2012 tPA Given: No: Rapid resolution of deficits MRankin: 0  Past Medical History  Diagnosis Date  . Hypertension   . Allergy   . Osteopenia   . Arthritis   . Hyperlipidemia     Family History  Problem Relation Age of Onset  . Heart disease Mother   . Diabetes Mother      Medications: Prior to Admission: Fosamax 70 mg per week TUMS 1 daily Zyrtec 10 mg per day Vitamin D Flexeril 5 mg at bedtime [place] and 75 mg twice a day when necessary Said he at 10 mg per day Omega-3 fatty acids 1000 mg twice a day Hyzaar 100-25 one per day Milk Thistle 1000 mg at bedtime Turmeric 4 and 50 mg twice a day  Physical Examination: Blood pressure 125/58, pulse 66, temperature 98.6 F (37 C), temperature source Oral, resp. rate 18, SpO2 92.00%.  Neurologic Examination: Mental Status: Alert, oriented, thought content appropriate.  Speech fluent without evidence of aphasia. Able to follow commands without difficulty. Cranial Nerves: II-Visual fields were normal. III/IV/VI-Pupils were equal and reacted. Extraocular movements were full and conjugate.    V/VII-no facial numbness and no facial weakness. VIII-normal. X-normal speech and symmetrical palatal movement. Motor: 5/5 bilaterally with normal tone and bulk Sensory: Normal throughout. Deep Tendon Reflexes: 1+ and  symmetric. Plantars: Mute bilaterally Cerebellar: Normal finger-to-nose testing. Carotid auscultation: Normal  Ct Head Wo Contrast  10/14/2012   *RADIOLOGY REPORT*  Clinical Data: Transient left lower extremity numbness and weakness  CT HEAD WITHOUT CONTRAST  Technique:  Contiguous axial images were obtained from the base of the skull through the vertex without contrast.  Comparison: None.  Findings:  There is very mild, likely age appropriate, diffuse volume loss. Old lacunar infarct within the right basal ganglia (14, series 2). Scattered very minimal periventricular hypodensities most conspicuous about the bilateral occipital horns, compatible microvascular ischemic disease.  No CT evidence of acute large territory infarct.  No intraparenchymal or extra-axial mass or hemorrhage.  Limited visualization of the paranasal sinuses and mastoid air cells are normal.  Regional soft tissues are normal. No displaced calvarial fracture.  IMPRESSION: 1.  No acute intracranial process. 2.  Mild volume loss and microvascular ischemic disease.   Original Report Authenticated By: Tacey Ruiz, MD    Assessment: 72 y.o. female with hypertension and hyperlipidemia presenting with transient left lower extremity weakness, most likely TIA. Acute right subcortical cerebral infarction cannot be ruled out, however.  Stroke Risk Factors - hyperlipidemia and hypertension  Plan: 1. HgbA1c, fasting lipid panel 2. MRI, MRA  of the brain without contrast 3. PT consult, OT consult, Speech consult 4. Echocardiogram 5. Carotid dopplers 6. Prophylactic therapy-Antiplatelet med: Aspirin 325 mg/day 7. Risk factor modification 8. Telemetry monitoring   C.R. Roseanne Reno, MD [facility] [phone]  10/14/2012, 7:53 PM

## 2012-10-14 NOTE — ED Notes (Signed)
Pt has meal tray at bedside, refused to be transported until she is finished with meal.

## 2012-10-14 NOTE — ED Notes (Signed)
Pt states at 0745 she had weakness in left leg and was unable to move it. This lasted <63min and pt states she is back at baseline. NIH-0. Pt denies any numbness/weakness. Pt states she had no other neuro symptoms at time of leg weakness. Pt speaking clear, full sentences, alert and oriented. NAD noted at this time.

## 2012-10-14 NOTE — ED Notes (Signed)
Pt given food per MD

## 2012-10-15 ENCOUNTER — Observation Stay (HOSPITAL_COMMUNITY): Payer: Medicare Other

## 2012-10-15 DIAGNOSIS — R5381 Other malaise: Secondary | ICD-10-CM | POA: Diagnosis not present

## 2012-10-15 DIAGNOSIS — I059 Rheumatic mitral valve disease, unspecified: Secondary | ICD-10-CM | POA: Diagnosis not present

## 2012-10-15 DIAGNOSIS — I10 Essential (primary) hypertension: Secondary | ICD-10-CM | POA: Diagnosis not present

## 2012-10-15 DIAGNOSIS — G459 Transient cerebral ischemic attack, unspecified: Secondary | ICD-10-CM

## 2012-10-15 DIAGNOSIS — G8190 Hemiplegia, unspecified affecting unspecified side: Secondary | ICD-10-CM | POA: Diagnosis not present

## 2012-10-15 DIAGNOSIS — E785 Hyperlipidemia, unspecified: Secondary | ICD-10-CM | POA: Diagnosis not present

## 2012-10-15 LAB — LIPID PANEL
Cholesterol: 159 mg/dL (ref 0–200)
HDL: 47 mg/dL (ref >39)
Total CHOL/HDL Ratio: 3.4 RATIO
VLDL: 26 mg/dL (ref 0–40)

## 2012-10-15 LAB — HEMOGLOBIN A1C: Mean Plasma Glucose: 105 mg/dL (ref <117)

## 2012-10-15 MED ORDER — ASPIRIN 325 MG PO TBEC: 325.0000 mg | DELAYED_RELEASE_TABLET | Freq: Every day | ORAL | Status: DC

## 2012-10-15 NOTE — Progress Notes (Signed)
Utilization review completed.  

## 2012-10-15 NOTE — Discharge Summary (Signed)
[facility] PROGRESS NOTE    Assessment/Plan:  TIA (transient ischemic attack) - carotid doppler pending. - HgbA1c 5.3, fasting lipid panel HDL >40, LDL< 100 - MRI: 5.15, MRA of the brain without contrast  - PT consult, OT consult, Speech consult pending - Carotid dopplers pending - Prophylactic therapy-Antiplatelet med: Aspirin - dose 325 mg PO daily  - Avoid D5 fluids as may be harmfull, start NS. - risk factor modification  - Cardiac Monitoring no events - Neurochecks q4h  - Keep MAP 70  Hyperlipidemia: - fasting lipid panel HDL >40, LDL< 100   HTN (hypertension): - allow some degree of permissive HTN.   Leukopenia with out neutropenia: - monitor.     Code Status: FULL  Family Communication: No Family at bedside  Disposition Plan: Home in afternoon   Consultants:  Stroke service  Procedures:  MRI/MRA 5.16.2014  Carotid doppler  ECHO  Antibiotics:  None  HPI/Subjective: No complains. Feels back to baseline  Objective: Filed Vitals:   10/14/12 1906 10/14/12 2026 10/15/12 0000 10/15/12 0400  BP: 125/58   108/82  Pulse: 66 70 57 58  Temp: 98.6 F (37 C) 98.2 F (36.8 C) 98 F (36.7 C) 97.9 F (36.6 C)  TempSrc:  Oral Oral Oral  Resp: 18 18 18 18   Height:  5\' 1"  (1.549 m)    Weight:  75.751 kg (167 lb)    SpO2: 92% 97% 98% 99%   No intake or output data in the 24 hours ending 10/15/12 0950 Filed Weights   10/14/12 2026  Weight: 75.751 kg (167 lb)    Exam:  General: Alert, awake, oriented x3, in no acute distress.  HEENT: No bruits, no goiter.  Heart: Regular rate and rhythm, without murmurs, rubs, gallops.  Lungs: Good air movement, clear to auscultation. Abdomen: Soft, nontender, nondistended, positive bowel sounds.  Neuro: Grossly intact, nonfocal.   Data Reviewed: Basic Metabolic Panel:  Recent Labs Lab 10/14/12 1359  NA 140  K 3.8  CL 106  CO2 26  GLUCOSE 102*  BUN 23  CREATININE 0.75  CALCIUM 9.2   Liver  Function Tests: No results found for this basename: AST, ALT, ALKPHOS, BILITOT, PROT, ALBUMIN,  in the last 168 hours No results found for this basename: LIPASE, AMYLASE,  in the last 168 hours No results found for this basename: AMMONIA,  in the last 168 hours CBC:  Recent Labs Lab 10/14/12 1359  WBC 3.3*  NEUTROABS 1.7  HGB 12.1  HCT 35.7*  MCV 89.9  PLT 171   Cardiac Enzymes: No results found for this basename: CKTOTAL, CKMB, CKMBINDEX, TROPONINI,  in the last 168 hours BNP (last 3 results) No results found for this basename: PROBNP,  in the last 8760 hours CBG: No results found for this basename: GLUCAP,  in the last 168 hours  No results found for this or any previous visit (from the past 240 hour(s)).   Studies: Ct Head Wo Contrast  10/14/2012   *RADIOLOGY REPORT*  Clinical Data: Transient left lower extremity numbness and weakness  CT HEAD WITHOUT CONTRAST  Technique:  Contiguous axial images were obtained from the base of the skull through the vertex without contrast.  Comparison: None.  Findings:  There is very mild, likely age appropriate, diffuse volume loss. Old lacunar infarct within the right basal ganglia (14, series 2). Scattered very minimal periventricular hypodensities most conspicuous about the bilateral occipital horns, compatible microvascular ischemic disease.  No CT evidence of acute large territory infarct.  No intraparenchymal or extra-axial mass or hemorrhage.  Limited visualization of the paranasal sinuses and mastoid air cells are normal.  Regional soft tissues are normal. No displaced calvarial fracture.  IMPRESSION: 1.  No acute intracranial process. 2.  Mild volume loss and microvascular ischemic disease.   Original Report Authenticated By: Tacey Ruiz, MD   Mr [facility] Wo Contrast  10/15/2012   *RADIOLOGY REPORT*  Clinical Data:  Clinical history states right leg weakness. Technologist history states left leg weakness.  Previous CT history states left  leg weakness.  MRI HEAD WITHOUT CONTRAST MRA HEAD WITHOUT CONTRAST  Technique:  Multiplanar, multiecho pulse sequences of the brain and surrounding structures were obtained without intravenous contrast. Angiographic images of the head were obtained using MRA technique without contrast.  Comparison:  Head CT 10/14/2012  MRI HEAD  Findings:  Diffusion imaging does not show any acute or subacute infarction.  The brainstem is normal.  There are a few old small vessel cerebellar infarctions.  The cerebral hemispheres show mild chronic small vessel disease affecting the deep and subcortical white matter and a few old lacunar infarctions in the right caudate.  No cortical or large vessel territory infarction.  No mass lesion, hemorrhage, hydrocephalus or extra-axial collection. No pituitary mass.  No fluid in the sinuses, middle ears or mastoids.  No skull or skull base lesion.  IMPRESSION: No acute finding.  A few old small vessel cerebellar infarctions. Mild chronic small vessel change within the hemispheric white matter and right caudate.  MRA HEAD  Findings: Both internal carotid arteries are widely patent into the brain.  No siphon stenosis.  The anterior and middle cerebral vessels are patent without proximal stenosis, aneurysm or vascular malformation.  Both vertebral arteries are patent to the basilar.  No basilar stenosis.  Posterior circulation branch vessels are normal with the right PCA taking a fetal origin from the anterior circulation.  IMPRESSION: Normal intracranial MR angiography of the large and medium-sized vessels.   Original Report Authenticated By: Paulina Fusi, M.D.   Mr Brain Wo Contrast  10/15/2012   *RADIOLOGY REPORT*  Clinical Data:  Clinical history states right leg weakness. Technologist history states left leg weakness.  Previous CT history states left leg weakness.  MRI HEAD WITHOUT CONTRAST MRA HEAD WITHOUT CONTRAST  Technique:  Multiplanar, multiecho pulse sequences of the brain and  surrounding structures were obtained without intravenous contrast. Angiographic images of the head were obtained using MRA technique without contrast.  Comparison:  Head CT 10/14/2012  MRI HEAD  Findings:  Diffusion imaging does not show any acute or subacute infarction.  The brainstem is normal.  There are a few old small vessel cerebellar infarctions.  The cerebral hemispheres show mild chronic small vessel disease affecting the deep and subcortical white matter and a few old lacunar infarctions in the right caudate.  No cortical or large vessel territory infarction.  No mass lesion, hemorrhage, hydrocephalus or extra-axial collection. No pituitary mass.  No fluid in the sinuses, middle ears or mastoids.  No skull or skull base lesion.  IMPRESSION: No acute finding.  A few old small vessel cerebellar infarctions. Mild chronic small vessel change within the hemispheric white matter and right caudate.  MRA HEAD  Findings: Both internal carotid arteries are widely patent into the brain.  No siphon stenosis.  The anterior and middle cerebral vessels are patent without proximal stenosis, aneurysm or vascular malformation.  Both vertebral arteries are patent to the basilar.  No basilar stenosis.  Posterior circulation branch vessels are normal with the right PCA taking a fetal origin from the anterior circulation.  IMPRESSION: Normal intracranial MR angiography of the large and medium-sized vessels.   Original Report Authenticated By: Paulina Fusi, M.D.    Scheduled Meds: . sodium chloride   Intravenous STAT  . aspirin EC  325 mg Oral Daily  . calcium carbonate  1 tablet Oral Daily  . cyclobenzaprine  5 mg Oral QHS  . enoxaparin (LOVENOX) injection  40 mg Subcutaneous Q24H  . ezetimibe  10 mg Oral Daily  . LORazepam  1 mg Oral Once  . omega-3 acid ethyl esters  1 g Oral BID  . pneumococcal 23 valent vaccine  0.5 mL Intramuscular Tomorrow-1000  . sodium chloride  3 mL Intravenous Q12H   Continuous  Infusions:    Marinda Elk  [facility] Pager [phone]. If 8PM-8AM, please contact night-coverage at [email or web address], password [facility] 10/15/2012, 9:50 AM  LOS: 1 day

## 2012-10-15 NOTE — Progress Notes (Signed)
  Echocardiogram 2D Echocardiogram has been performed.  Andrea Moreno 10/15/2012, 4:17 PM

## 2012-10-15 NOTE — Progress Notes (Signed)
VASCULAR LAB PRELIMINARY  PRELIMINARY  PRELIMINARY  PRELIMINARY  Carotid duplex completed.    Preliminary report:  Bilateral:  No evidence of hemodynamically significant internal carotid artery stenosis.   Vertebral artery flow is antegrade.     Andrea Moreno, RVS 10/15/2012, 4:09 PM

## 2012-10-15 NOTE — Progress Notes (Signed)
Stroke Team Progress Note  HISTORY Andrea Moreno is an 72 y.o. female with a history of hypertension, hyperlipidemia, arthritis and osteopenia who experienced an episode of weakness involving the left lower extremity which lasted about 5 minutes this morning. Onset was at 7:45 AM 10/14/2013 while she was cooking breakfast. She had no associated left upper extremity weakness nor speech abnormality. She has no previous history stroke nor TIA. She has not been on antiplatelet therapy. CT scan of her head showed no acute intracranial abnormality. Patient's NIH stroke score was 0. Patient was not a TPA candidate secondary to rapid improvement; NIHSS 0. She was admitted for further evaluation and treatment.  SUBJECTIVE no family is at the bedside.  Overall she feels her condition is completely resolved. Denied headache at time of sx. No neck pain. Reports she was fully conscious.  OBJECTIVE Most recent Vital Signs: Filed Vitals:   10/14/12 1906 10/14/12 2026 10/15/12 0000 10/15/12 0400  BP: 125/58   108/82  Pulse: 66 70 57 58  Temp: 98.6 F (37 C) 98.2 F (36.8 C) 98 F (36.7 C) 97.9 F (36.6 C)  TempSrc:  Oral Oral Oral  Resp: 18 18 18 18   Height:  5\' 1"  (1.549 m)    Weight:  75.751 kg (167 lb)    SpO2: 92% 97% 98% 99%   CBG (last 3)  No results found for this basename: GLUCAP,  in the last 72 hours  IV Fluid Intake:     MEDICATIONS  . sodium chloride   Intravenous STAT  . aspirin EC  325 mg Oral Daily  . calcium carbonate  1 tablet Oral Daily  . cyclobenzaprine  5 mg Oral QHS  . enoxaparin (LOVENOX) injection  40 mg Subcutaneous Q24H  . ezetimibe  10 mg Oral Daily  . LORazepam  1 mg Oral Once  . omega-3 acid ethyl esters  1 g Oral BID  . pneumococcal 23 valent vaccine  0.5 mL Intramuscular Tomorrow-1000  . sodium chloride  3 mL Intravenous Q12H   PRN:  acetaminophen, acetaminophen, diclofenac, ondansetron (ZOFRAN) IV, ondansetron, oxyCODONE  Diet:  Cardiac thin  liquids Activity:  Bedrest with Bathroom privileges, Up with assistance DVT Prophylaxis:  Lovenox 40 mg sq daily   CLINICALLY SIGNIFICANT STUDIES Basic Metabolic Panel:   Recent Labs Lab 10/14/12 1359  NA 140  K 3.8  CL 106  CO2 26  GLUCOSE 102*  BUN 23  CREATININE 0.75  CALCIUM 9.2   Liver Function Tests: No results found for this basename: AST, ALT, ALKPHOS, BILITOT, PROT, ALBUMIN,  in the last 168 hours CBC:   Recent Labs Lab 10/14/12 1359  WBC 3.3*  NEUTROABS 1.7  HGB 12.1  HCT 35.7*  MCV 89.9  PLT 171   Coagulation: No results found for this basename: LABPROT, INR,  in the last 168 hours Cardiac Enzymes: No results found for this basename: CKTOTAL, CKMB, CKMBINDEX, TROPONINI,  in the last 168 hours Urinalysis: No results found for this basename: COLORURINE, APPERANCEUR, LABSPEC, PHURINE, GLUCOSEU, HGBUR, BILIRUBINUR, KETONESUR, PROTEINUR, UROBILINOGEN, NITRITE, LEUKOCYTESUR,  in the last 168 hours Lipid Panel    Component Value Date/Time   CHOL 159 10/15/2012 0500   TRIG 130 10/15/2012 0500   HDL 47 10/15/2012 0500   CHOLHDL 3.4 10/15/2012 0500   VLDL 26 10/15/2012 0500   LDLCALC 86 10/15/2012 0500   HgbA1C  Lab Results  Component Value Date   HGBA1C 5.3 10/14/2012    Urine Drug Screen:   No  results found for this basename: labopia,  cocainscrnur,  labbenz,  amphetmu,  thcu,  labbarb    Alcohol Level: No results found for this basename: ETH,  in the last 168 hours  CT of the brain  10/14/2012   1.  No acute intracranial process. 2.  Mild volume loss and microvascular ischemic disease.     MRI of the brain  10/15/2012   No acute finding.  A few old small vessel cerebellar infarctions. Mild chronic small vessel change within the hemispheric white matter and right caudate.    MRA of the brain  10/15/2012    Normal intracranial MR angiography of the large and medium-sized vessels.   2D Echocardiogram    Carotid Doppler    CXR    EKG  normal sinus rhythm.    Therapy Recommendations no needs  Physical Exam   Pleasant elderly obese Caucasian lady currently not in distress.Awake alert. Afebrile. Head is nontraumatic. Neck is supple without bruit. Hearing is normal. Cardiac exam no murmur or gallop. Lungs are clear to auscultation. Distal pulses are well felt.  Neurological Exam ;    Awake  Alert oriented x 3. Normal speech and language.eye movements full without nystagmus.fundi were not visualized. Vision acuity and fields appear normal. Hearing is normal. Palatal movements are normal. Face symmetric. Tongue midline. Normal strength, tone, reflexes and coordination. Normal sensation. Gait deferred. ASSESSMENT [name] is a 72 y.o. female presenting with transient left lower extremity weakness. MRI imaging confirms no acute infarct. Doubt stroke or TIA. Concern weakness may be related to her back given hx back issues, neurogenic claudication. Continued hospitalization is not indicated for that workup. On no antiplatelets prior to admission. Now on aspirin 325 mg orally every day for secondary stroke prevention. Patient with no resultant neuro sx today. Work up underway.  Hypertension Hyperlipidemia, LDL 86, on zetia 10 and fish oil PTA, on zetia 10 and fish oil  now, at goal LDL < 100 HgbA1c 5.3  Hospital day # 1  TREATMENT/PLAN  Complete stroke workup prior to discharge  F/u carotid doppler  Check 2D echo  Continue aspirin 325 mg orally every day for secondary stroke prevention.  Ok for discharge once workup completed  Follow up Dr. Pearlean Brownie in the office in 2 mon to further assess back etiologies  Annie Main, MSN, RN, ANVP-BC, ANP-BC, GNP-BC [facility] Pager: [phone] 10/15/2012 10:27 AM  I have personally obtained a history, examined the patient, evaluated imaging results, and formulated the assessment and plan of care. I agree with the above. Delia Heady, MD

## 2012-10-15 NOTE — Discharge Summary (Addendum)
Physician Discharge Summary  Andrea Moreno ZOX:[id number] DOB: [birth year] DOA: 10/14/2012  PCP: Redmond Baseman, MD  Admit date: 10/14/2012 Discharge date: 10/15/2012  Time spent: 35 minutes  Recommendations for Outpatient Follow-up:  1. Follow up with PCP in 2 - 4 week titrate Bp meds as toleate it. CBC as an outpatient for mild leukopenia follow up. 2. Follow up with Neurology.  Discharge Diagnoses:  Active Problems:   TIA (transient ischemic attack)   Hyperlipidemia   HTN (hypertension)   Leukopenia   Discharge Condition: stable  Diet recommendation: heart healthy  Filed Weights   10/14/12 2026  Weight: 75.751 kg (167 lb)    History of present illness:  72 year old female with a history of hypertension, osteoporosis, hyperlipidemia, osteoarthritis presents with right leg weakness that started on the patient was fixing breakfast this morning. The patient described it as her right foot feeling heavy to the point where she was unable to lift it. The patient was standing at the time. She did not fall nor was there any syncope. This has never occurred in the past. She states the episode lasted 5 minutes with complete resolution of her symptoms. She has not had any recurrence of her symptoms since this morning. She denied any headache, visual disturbance, dysarthria, any other sensory disturbance or other extremity weakness. The patient denies any fevers, chills, chest pain, shortness of breath, nausea, vomiting, diarrhea, dizziness. The patient went to see her primary care provider whom told her to come to the emergency department for further evaluation. The patient denies states that she fell back from a chair and hit her back on Tuesday, 2 days ago. However she denies any worsening back pain. She does complain of chronic osteoarthritis associated with lumbar back pain for which she takes Voltaren. The patient has a remote history of tobacco use, quit in 1987. She smoked one third  pack per day x5 years.  In the emergency department, BMP was essentially unremarkable. CBC showed mild leukopenia with a WBC 3.3. EKG was sinus rhythm without any ST-T changes. Vitals were stable. CT of the brain was negative.   Hospital Course:  TIA (transient ischemic attack)  - carotid doppler 5.16.2014: No evidence of hemodynamically significant internal carotid artery stenosis. Vertebral artery flow is antegrade - HgbA1c 5.3, fasting lipid panel HDL >40, LDL< 100  - MRI: 8.11, MRA of the brain without contrast  - Prophylactic therapy-Antiplatelet med: Aspirin - dose 325 mg PO daily  - risk factor modification  - Cardiac Monitoring no events  - Neurochecks q4h    Hyperlipidemia:  - fasting lipid panel HDL >40, LDL< 100   HTN (hypertension):  - resume home meds  Leukopenia with out neutropenia:  - monitor as an outpatient by PCP.   Procedures:  MRI/MRA: negative.  Consultations:  neurology  Discharge Exam: Filed Vitals:   10/15/12 0000 10/15/12 0400 10/15/12 0800 10/15/12 1200  BP:  108/82 122/79 120/82  Pulse: 57 58 61 62  Temp: 98 F (36.7 C) 97.9 F (36.6 C) 98.1 F (36.7 C) 97.9 F (36.6 C)  TempSrc: Oral Oral Oral Oral  Resp: 18 18 18 18   Height:      Weight:      SpO2: 98% 99% 98% 99%    General: See progress note  Discharge Instructions  Discharge Orders   Future Orders Complete By Expires     Diet - low sodium heart healthy  As directed     Increase activity slowly  As directed  Medication List    TAKE these medications       alendronate 70 MG tablet  Commonly known as:  FOSAMAX  Take 70 mg by mouth every 7 (seven) days. Sundays only     aspirin 325 MG EC tablet  Take 1 tablet (325 mg total) by mouth daily.     calcium carbonate 500 MG chewable tablet  Commonly known as:  TUMS - dosed in mg elemental calcium  Chew 1 tablet by mouth daily.     cetirizine 10 MG tablet  Commonly known as:  ZYRTEC  Take 10 mg by mouth daily.      cholecalciferol 400 UNITS Tabs  Commonly known as:  VITAMIN D  Take 400-800 Units by mouth daily. Tues, Thurs, Sat-2 tabs daily, all other days 1 tab     cyclobenzaprine 5 MG tablet  Commonly known as:  FLEXERIL  Take 5 mg by mouth at bedtime.     diclofenac 75 MG EC tablet  Commonly known as:  VOLTAREN  Take 75 mg by mouth 2 (two) times daily. USES PRN     ezetimibe 10 MG tablet  Commonly known as:  ZETIA  Take 10 mg by mouth daily.     fish oil-omega-3 fatty acids 1000 MG capsule  Take 1 g by mouth 2 (two) times daily.     losartan-hydrochlorothiazide 100-25 MG per tablet  Commonly known as:  HYZAAR  Take 1 tablet by mouth daily.     Milk Thistle 1000 MG Caps  Take 1,000 mg by mouth at bedtime.     OVER THE COUNTER MEDICATION  Take 1 capsule by mouth 3 (three) times daily. "Replenex"     TURMERIC PO  Take 450 mg by mouth 2 (two) times daily.       Allergies  Allergen Reactions  . Demerol (Meperidine)        Follow-up Information   Follow up with Gates Rigg, MD. Schedule an appointment as soon as possible for a visit today. (call for appt)    Contact information:   [address] [place] [address] [phone]        The results of significant diagnostics from this hospitalization (including imaging, microbiology, ancillary and laboratory) are listed below for reference.    Significant Diagnostic Studies: Ct Head Wo Contrast  10/14/2012   *RADIOLOGY REPORT*  Clinical Data: Transient left lower extremity numbness and weakness  CT HEAD WITHOUT CONTRAST  Technique:  Contiguous axial images were obtained from the base of the skull through the vertex without contrast.  Comparison: None.  Findings:  There is very mild, likely age appropriate, diffuse volume loss. Old lacunar infarct within the right basal ganglia (14, series 2). Scattered very minimal periventricular hypodensities most conspicuous about the bilateral occipital horns,  compatible microvascular ischemic disease.  No CT evidence of acute large territory infarct.  No intraparenchymal or extra-axial mass or hemorrhage.  Limited visualization of the paranasal sinuses and mastoid air cells are normal.  Regional soft tissues are normal. No displaced calvarial fracture.  IMPRESSION: 1.  No acute intracranial process. 2.  Mild volume loss and microvascular ischemic disease.   Original Report Authenticated By: Tacey Ruiz, MD   Mr [facility] Wo Contrast  10/15/2012   *RADIOLOGY REPORT*  Clinical Data:  Clinical history states right leg weakness. Technologist history states left leg weakness.  Previous CT history states left leg weakness.  MRI HEAD WITHOUT CONTRAST MRA HEAD WITHOUT CONTRAST  Technique:  Multiplanar, multiecho pulse sequences of the brain and surrounding structures were obtained without intravenous contrast. Angiographic images of the head were obtained using MRA technique without contrast.  Comparison:  Head CT 10/14/2012  MRI HEAD  Findings:  Diffusion imaging does not show any acute or subacute infarction.  The brainstem is normal.  There are a few old small vessel cerebellar infarctions.  The cerebral hemispheres show mild chronic small vessel disease affecting the deep and subcortical white matter and a few old lacunar infarctions in the right caudate.  No cortical or large vessel territory infarction.  No mass lesion, hemorrhage, hydrocephalus or extra-axial collection. No pituitary mass.  No fluid in the sinuses, middle ears or mastoids.  No skull or skull base lesion.  IMPRESSION: No acute finding.  A few old small vessel cerebellar infarctions. Mild chronic small vessel change within the hemispheric white matter and right caudate.  MRA HEAD  Findings: Both internal carotid arteries are widely patent into the brain.  No siphon stenosis.  The anterior and middle cerebral vessels are patent without proximal stenosis, aneurysm or vascular malformation.  Both vertebral  arteries are patent to the basilar.  No basilar stenosis.  Posterior circulation branch vessels are normal with the right PCA taking a fetal origin from the anterior circulation.  IMPRESSION: Normal intracranial MR angiography of the large and medium-sized vessels.   Original Report Authenticated By: Paulina Fusi, M.D.   Mr Brain Wo Contrast  10/15/2012   *RADIOLOGY REPORT*  Clinical Data:  Clinical history states right leg weakness. Technologist history states left leg weakness.  Previous CT history states left leg weakness.  MRI HEAD WITHOUT CONTRAST MRA HEAD WITHOUT CONTRAST  Technique:  Multiplanar, multiecho pulse sequences of the brain and surrounding structures were obtained without intravenous contrast. Angiographic images of the head were obtained using MRA technique without contrast.  Comparison:  Head CT 10/14/2012  MRI HEAD  Findings:  Diffusion imaging does not show any acute or subacute infarction.  The brainstem is normal.  There are a few old small vessel cerebellar infarctions.  The cerebral hemispheres show mild chronic small vessel disease affecting the deep and subcortical white matter and a few old lacunar infarctions in the right caudate.  No cortical or large vessel territory infarction.  No mass lesion, hemorrhage, hydrocephalus or extra-axial collection. No pituitary mass.  No fluid in the sinuses, middle ears or mastoids.  No skull or skull base lesion.  IMPRESSION: No acute finding.  A few old small vessel cerebellar infarctions. Mild chronic small vessel change within the hemispheric white matter and right caudate.  MRA HEAD  Findings: Both internal carotid arteries are widely patent into the brain.  No siphon stenosis.  The anterior and middle cerebral vessels are patent without proximal stenosis, aneurysm or vascular malformation.  Both vertebral arteries are patent to the basilar.  No basilar stenosis.  Posterior circulation branch vessels are normal with the right PCA taking a fetal  origin from the anterior circulation.  IMPRESSION: Normal intracranial MR angiography of the large and medium-sized vessels.   Original Report Authenticated By: Paulina Fusi, M.D.    Microbiology: No results found for this or any previous visit (from the past 240 hour(s)).   Labs: Basic Metabolic Panel:  Recent Labs Lab 10/14/12 1359  NA 140  K 3.8  CL 106  CO2 26  GLUCOSE 102*  BUN 23  CREATININE 0.75  CALCIUM 9.2   Liver Function Tests: No results found for this basename: AST,  ALT, ALKPHOS, BILITOT, PROT, ALBUMIN,  in the last 168 hours No results found for this basename: LIPASE, AMYLASE,  in the last 168 hours No results found for this basename: AMMONIA,  in the last 168 hours CBC:  Recent Labs Lab 10/14/12 1359  WBC 3.3*  NEUTROABS 1.7  HGB 12.1  HCT 35.7*  MCV 89.9  PLT 171   Cardiac Enzymes: No results found for this basename: CKTOTAL, CKMB, CKMBINDEX, TROPONINI,  in the last 168 hours BNP: BNP (last 3 results) No results found for this basename: PROBNP,  in the last 8760 hours CBG: No results found for this basename: GLUCAP,  in the last 168 hours   Signed:  Marinda Elk  [facility] 10/15/2012, 4:54 PM

## 2012-11-01 DIAGNOSIS — M6530 Trigger finger, unspecified finger: Secondary | ICD-10-CM | POA: Diagnosis not present

## 2012-11-01 DIAGNOSIS — M25559 Pain in unspecified hip: Secondary | ICD-10-CM | POA: Diagnosis not present

## 2012-11-01 DIAGNOSIS — M1610 Unilateral primary osteoarthritis, unspecified hip: Secondary | ICD-10-CM | POA: Diagnosis not present

## 2012-11-03 DIAGNOSIS — M25559 Pain in unspecified hip: Secondary | ICD-10-CM | POA: Diagnosis not present

## 2012-11-12 DIAGNOSIS — R21 Rash and other nonspecific skin eruption: Secondary | ICD-10-CM | POA: Diagnosis not present

## 2012-11-15 DIAGNOSIS — L270 Generalized skin eruption due to drugs and medicaments taken internally: Secondary | ICD-10-CM | POA: Diagnosis not present

## 2012-11-22 DIAGNOSIS — M545 Low back pain: Secondary | ICD-10-CM | POA: Diagnosis not present

## 2012-11-22 DIAGNOSIS — M1610 Unilateral primary osteoarthritis, unspecified hip: Secondary | ICD-10-CM | POA: Diagnosis not present

## 2012-11-29 ENCOUNTER — Ambulatory Visit: Payer: Medicare Other | Attending: Sports Medicine | Admitting: Physical Therapy

## 2012-11-29 DIAGNOSIS — M545 Low back pain, unspecified: Secondary | ICD-10-CM | POA: Diagnosis not present

## 2012-11-29 DIAGNOSIS — R5381 Other malaise: Secondary | ICD-10-CM | POA: Diagnosis not present

## 2012-11-29 DIAGNOSIS — M2560 Stiffness of unspecified joint, not elsewhere classified: Secondary | ICD-10-CM | POA: Diagnosis not present

## 2012-11-29 DIAGNOSIS — IMO0001 Reserved for inherently not codable concepts without codable children: Secondary | ICD-10-CM | POA: Diagnosis not present

## 2012-12-07 ENCOUNTER — Ambulatory Visit: Payer: Medicare Other | Attending: Sports Medicine | Admitting: Physical Therapy

## 2012-12-07 DIAGNOSIS — M545 Low back pain, unspecified: Secondary | ICD-10-CM | POA: Diagnosis not present

## 2012-12-07 DIAGNOSIS — IMO0001 Reserved for inherently not codable concepts without codable children: Secondary | ICD-10-CM | POA: Insufficient documentation

## 2012-12-07 DIAGNOSIS — R5381 Other malaise: Secondary | ICD-10-CM | POA: Diagnosis not present

## 2012-12-07 DIAGNOSIS — M2560 Stiffness of unspecified joint, not elsewhere classified: Secondary | ICD-10-CM | POA: Diagnosis not present

## 2012-12-13 ENCOUNTER — Other Ambulatory Visit: Payer: Self-pay | Admitting: Family Medicine

## 2012-12-15 ENCOUNTER — Other Ambulatory Visit: Payer: Self-pay | Admitting: Family Medicine

## 2012-12-15 NOTE — Telephone Encounter (Signed)
Patient needs appointment with provider to recheck her blood pressure and check electrolyte

## 2012-12-15 NOTE — Telephone Encounter (Signed)
Last seen 05/03/13  DWM 

## 2012-12-22 ENCOUNTER — Ambulatory Visit: Payer: Medicare Other | Admitting: Physical Therapy

## 2013-01-21 ENCOUNTER — Ambulatory Visit (INDEPENDENT_AMBULATORY_CARE_PROVIDER_SITE_OTHER): Payer: Medicare Other | Admitting: Family Medicine

## 2013-01-21 ENCOUNTER — Encounter: Payer: Self-pay | Admitting: Family Medicine

## 2013-01-21 VITALS — BP 150/83 | Temp 98.0°F | Resp 66 | Wt 161.6 lb

## 2013-01-21 DIAGNOSIS — I10 Essential (primary) hypertension: Secondary | ICD-10-CM | POA: Diagnosis not present

## 2013-01-21 DIAGNOSIS — G459 Transient cerebral ischemic attack, unspecified: Secondary | ICD-10-CM

## 2013-01-21 DIAGNOSIS — M899 Disorder of bone, unspecified: Secondary | ICD-10-CM | POA: Diagnosis not present

## 2013-01-21 DIAGNOSIS — M8580 Other specified disorders of bone density and structure, unspecified site: Secondary | ICD-10-CM | POA: Insufficient documentation

## 2013-01-21 DIAGNOSIS — E785 Hyperlipidemia, unspecified: Secondary | ICD-10-CM

## 2013-01-21 MED ORDER — EZETIMIBE 10 MG PO TABS: ORAL_TABLET | ORAL | Status: DC

## 2013-01-21 MED ORDER — ALENDRONATE SODIUM 70 MG PO TABS: 70.0000 mg | ORAL_TABLET | ORAL | Status: DC

## 2013-01-21 MED ORDER — LOSARTAN POTASSIUM-HCTZ 100-25 MG PO TABS: ORAL_TABLET | ORAL | Status: DC

## 2013-01-21 NOTE — Patient Instructions (Addendum)
      Dr Hajime Asfaw's Recommendations  For nutrition information, I recommend books:  1).Eat to Live by Dr Joel Fuhrman. 2).Prevent and Reverse Heart Disease by Dr Caldwell Esselstyn. 3) Dr Neal Barnard's Book:  Program to Reverse Diabetes  Exercise recommendations are:  If unable to walk, then the patient can exercise in a chair 3 times a day. By flapping arms like a bird gently and raising legs outwards to the front.  If ambulatory, the patient can go for walks for 30 minutes 3 times a week. Then increase the intensity and duration as tolerated.  Goal is to try to attain exercise frequency to 5 times a week.  If applicable: Best to perform resistance exercises (machines or weights) 2 days a week and cardio type exercises 3 days per week.  

## 2013-01-21 NOTE — Progress Notes (Signed)
Patient ID: Andrea Moreno, female   DOB: 1941/03/05, 72 y.o.   MRN: [id number] SUBJECTIVE: CC: Chief Complaint  Patient presents with  . Follow-up  . Medication Refill    refill losartan,alednate,zeta     HPI: Patient is here for follow up of hyperlipidemia/Hypertension denies Headache;denies Chest Pain;denies weakness;denies Shortness of Breath and orthopnea;denies Visual changes;denies palpitations;denies cough;denies pedal edema;denies symptoms of TIA or stroke;deniesClaudication symptoms. admits to Compliance with medications; denies Problems with medications. BP normal at home. Get anxious coming here.  Doing well overall with her medications  Past Medical History  Diagnosis Date  . Hypertension   . Allergy   . Osteopenia   . Hyperlipidemia   . Complication of anesthesia     "1950's had appendix out; kicked the RN; no problems since" (10/14/2012)  . Heart murmur   . Pneumonia     "couple of times; last time in the mid 1990's" (10/14/2012)  . Arthritis     "back, right shoulder, hips" (10/14/2012)  . TIA (transient ischemic attack) 10/14/2012   Past Surgical History  Procedure Laterality Date  . Appendectomy  1950's  . Tonsillectomy  1940's  . Back surgery    . Dilation and curettage of uterus  ~ 1975  . Tubal ligation  1960's  . Lumbar disc surgery  1975    "had a disc removed" (10/14/2012)   History   Social History  . Marital Status: Divorced    Spouse Name: N/A    Number of Children: N/A  . Years of Education: N/A   Occupational History  . Not on file.   Social History Main Topics  . Smoking status: Former Smoker -- 0.30 packs/day for 5 years    Types: Cigarettes    Quit date: 08/31/1985  . Smokeless tobacco: Never Used  . Alcohol Use: 6.0 oz/week    10 Glasses of wine per week     Comment: 10/14/2012 "~ 6 oz wine/day"  . Drug Use: No  . Sexual Activity: No   Other Topics Concern  . Not on file   Social History Narrative  . No narrative on  file   Family History  Problem Relation Age of Onset  . Heart disease Mother   . Diabetes Mother    Current Outpatient Prescriptions on File Prior to Visit  Medication Sig Dispense Refill  . aspirin EC 325 MG EC tablet Take 1 tablet (325 mg total) by mouth daily.  30 tablet  0  . calcium carbonate (TUMS - DOSED IN MG ELEMENTAL CALCIUM) 500 MG chewable tablet Chew 1 tablet by mouth daily.      . cetirizine (ZYRTEC) 10 MG tablet Take 10 mg by mouth daily.      . cholecalciferol (VITAMIN D) 400 UNITS TABS Take 400-800 Units by mouth daily. Tues, Thurs, Sat-2 tabs daily, all other days 1 tab      . cyclobenzaprine (FLEXERIL) 5 MG tablet Take 5 mg by mouth 2 (two) times daily as needed.       . fish oil-omega-3 fatty acids 1000 MG capsule Take 1 g by mouth 2 (two) times daily.       . Milk Thistle 1000 MG CAPS Take 1,000 mg by mouth at bedtime.       Marland Kitchen OVER THE COUNTER MEDICATION Take 1 capsule by mouth 3 (three) times daily. "Replenex"      . TURMERIC PO Take 450 mg by mouth 2 (two) times daily.  No current facility-administered medications on file prior to visit.   Allergies  Allergen Reactions  . Celebrex [Celecoxib]     rash  . Demerol [Meperidine]    Immunization History  Administered Date(s) Administered  . Pneumococcal Polysaccharide 10/15/2012   Prior to Admission medications   Medication Sig Start Date End Date Taking? Authorizing Provider  alendronate (FOSAMAX) 70 MG tablet Take 1 tablet (70 mg total) by mouth every 7 (seven) days. Sundays only 01/21/13  Yes Ileana Ladd, MD  aspirin EC 325 MG EC tablet Take 1 tablet (325 mg total) by mouth daily. 10/15/12  Yes Marinda Elk, MD  calcium carbonate (TUMS - DOSED IN MG ELEMENTAL CALCIUM) 500 MG chewable tablet Chew 1 tablet by mouth daily.   Yes Historical Provider, MD  cetirizine (ZYRTEC) 10 MG tablet Take 10 mg by mouth daily.   Yes Historical Provider, MD  cholecalciferol (VITAMIN D) 400 UNITS TABS Take 400-800  Units by mouth daily. Tues, Thurs, Sat-2 tabs daily, all other days 1 tab   Yes Historical Provider, MD  cyclobenzaprine (FLEXERIL) 5 MG tablet Take 5 mg by mouth 2 (two) times daily as needed.    Yes Historical Provider, MD  ezetimibe (ZETIA) 10 MG tablet TAKE 1 TABLET DAILY 01/21/13  Yes Ileana Ladd, MD  fish oil-omega-3 fatty acids 1000 MG capsule Take 1 g by mouth 2 (two) times daily.    Yes Historical Provider, MD  losartan-hydrochlorothiazide (HYZAAR) 100-25 MG per tablet TAKE 1 TABLET DAILY 01/21/13  Yes Ileana Ladd, MD  Milk Thistle 1000 MG CAPS Take 1,000 mg by mouth at bedtime.    Yes Historical Provider, MD  OVER THE COUNTER MEDICATION Take 1 capsule by mouth 3 (three) times daily. "Replenex"   Yes Historical Provider, MD  TURMERIC PO Take 450 mg by mouth 2 (two) times daily.    Yes Historical Provider, MD  VOLTAREN 1 % GEL  12/13/12  Yes Historical Provider, MD     ROS: As above in the HPI. All other systems are stable or negative.  OBJECTIVE: APPEARANCE:  Patient in no acute distress.The patient appeared well nourished and normally developed. Acyanotic. Waist: VITAL SIGNS:BP 150/83  Temp(Src) 98 F (36.7 C) (Oral)  Resp 66  Wt 161 lb 9.6 oz (73.301 kg)  BMI 30.55 kg/m2 WF short stature Recheck BP 138/80  SKIN: warm and  Dry without overt rashes, tattoos and scars  HEAD and Neck: without JVD, Head and scalp: normal Eyes:No scleral icterus. Fundi normal, eye movements normal. Ears: Auricle normal, canal normal, Tympanic membranes normal, insufflation normal. Nose: normal Throat: normal Neck & thyroid: normal  CHEST & LUNGS: Chest wall: normal Lungs: Clear  CVS: Reveals the PMI to be normally located. Regular rhythm, First and Second Heart sounds are normal,  absence of murmurs, rubs or gallops. Peripheral vasculature: Radial pulses: normal Dorsal pedis pulses: normal Posterior pulses: normal  ABDOMEN:  Appearance: obese Benign, no organomegaly, no  masses, no Abdominal Aortic enlargement. No Guarding , no rebound. No Bruits. Bowel sounds: normal  RECTAL: N/A GU: N/A  EXTREMETIES: nonedematous.  MUSCULOSKELETAL:  Spine: normal Joints: intact  NEUROLOGIC: oriented to time,place and person; nonfocal. Strength is normal Sensory is normal Reflexes are normal Cranial Nerves are normal. Results for orders placed during the hospital encounter of 10/14/12  BASIC METABOLIC PANEL      Result Value Range   Sodium 140  135 - 145 mEq/L   Potassium 3.8  3.5 - 5.1 mEq/L  Chloride 106  96 - 112 mEq/L   CO2 26  19 - 32 mEq/L   Glucose, Bld 102 (*) 70 - 99 mg/dL   BUN 23  6 - 23 mg/dL   Creatinine, Ser 4.09  0.50 - 1.10 mg/dL   Calcium 9.2  8.4 - 81.1 mg/dL   GFR calc non Af Amer 83 (*) >90 mL/min   GFR calc Af Amer >90  >90 mL/min  CBC WITH DIFFERENTIAL      Result Value Range   WBC 3.3 (*) 4.0 - 10.5 K/uL   RBC 3.97  3.87 - 5.11 MIL/uL   Hemoglobin 12.1  12.0 - 15.0 g/dL   HCT 91.4 (*) 78.2 - 95.6 %   MCV 89.9  78.0 - 100.0 fL   MCH 30.5  26.0 - 34.0 pg   MCHC 33.9  30.0 - 36.0 g/dL   RDW 21.3  08.6 - 57.8 %   Platelets 171  150 - 400 K/uL   Neutrophils Relative % 53  43 - 77 %   Neutro Abs 1.7  1.7 - 7.7 K/uL   Lymphocytes Relative 34  12 - 46 %   Lymphs Abs 1.1  0.7 - 4.0 K/uL   Monocytes Relative 7  3 - 12 %   Monocytes Absolute 0.2  0.1 - 1.0 K/uL   Eosinophils Relative 5  0 - 5 %   Eosinophils Absolute 0.2  0.0 - 0.7 K/uL   Basophils Relative 1  0 - 1 %   Basophils Absolute 0.0  0.0 - 0.1 K/uL  HEMOGLOBIN A1C      Result Value Range   Hemoglobin A1C 5.3  <5.7 %   Mean Plasma Glucose 105  <117 mg/dL  LIPID PANEL      Result Value Range   Cholesterol 159  0 - 200 mg/dL   Triglycerides 469  <629 mg/dL   HDL 47  >52 mg/dL   Total CHOL/HDL Ratio 3.4     VLDL 26  0 - 40 mg/dL   LDL Cholesterol 86  0 - 99 mg/dL  TSH      Result Value Range   TSH 5.936 (*) 0.350 - 4.500 uIU/mL    ASSESSMENT: Osteopenia - Plan:  alendronate (FOSAMAX) 70 MG tablet, Vit D  25 hydroxy (rtn osteoporosis monitoring)  TIA (transient ischemic attack) - Plan: NMR, lipoprofile  Hyperlipidemia - Plan: CMP14+EGFR, NMR, lipoprofile, ezetimibe (ZETIA) 10 MG tablet  HTN (hypertension) - Plan: CMP14+EGFR, losartan-hydrochlorothiazide (HYZAAR) 100-25 MG per tablet  PLAN: Orders Placed This Encounter  Procedures  . CMP14+EGFR  . NMR, lipoprofile  . Vit D  25 hydroxy (rtn osteoporosis monitoring)    Meds ordered this encounter  Medications  . DISCONTD: CELEBREX 200 MG capsule    Sig:   . VOLTAREN 1 % GEL    Sig:   . DISCONTD: hydrOXYzine (ATARAX/VISTARIL) 10 MG tablet    Sig:   . DISCONTD: methylPREDNIsolone (MEDROL DOSPACK) 4 MG tablet    Sig:   . DISCONTD: triamcinolone cream (KENALOG) 0.1 %    Sig:   . losartan-hydrochlorothiazide (HYZAAR) 100-25 MG per tablet    Sig: TAKE 1 TABLET DAILY    Dispense:  30 tablet    Refill:  11  . alendronate (FOSAMAX) 70 MG tablet    Sig: Take 1 tablet (70 mg total) by mouth every 7 (seven) days. Sundays only    Dispense:  4 tablet    Refill:  11  . ezetimibe (ZETIA) 10  MG tablet    Sig: TAKE 1 TABLET DAILY    Dispense:  30 tablet    Refill:  11    Must be seen before next refill         Dr Woodroe Mode Recommendations  For nutrition information, I recommend books:  1).Eat to Live by Dr Monico Hoar. 2).Prevent and Reverse Heart Disease by Dr Suzzette Righter. 3) Dr Katherina Right Book:  Program to Reverse Diabetes  Exercise recommendations are:  If unable to walk, then the patient can exercise in a chair 3 times a day. By flapping arms like a bird gently and raising legs outwards to the front.  If ambulatory, the patient can go for walks for 30 minutes 3 times a week. Then increase the intensity and duration as tolerated.  Goal is to try to attain exercise frequency to 5 times a week.  If applicable: Best to perform resistance exercises (machines or weights) 2  days a week and cardio type exercises 3 days per week.  Return in about 4 months (around 05/23/2013) for Recheck medical problems.  Kuzey Ogata P. [place] Charon, M.D.

## 2013-01-23 LAB — CMP14+EGFR
ALT: 19 IU/L (ref 0–32)
AST: 15 IU/L (ref 0–40)
Albumin/Globulin Ratio: 2.3 (ref 1.1–2.5)
Albumin: 4.3 g/dL (ref 3.5–4.8)
Alkaline Phosphatase: 63 IU/L (ref 39–117)
BUN/Creatinine Ratio: 24 (ref 11–26)
BUN: 18 mg/dL (ref 8–27)
CO2: 23 mmol/L (ref 18–29)
Calcium: 9.2 mg/dL (ref 8.6–10.2)
Chloride: 103 mmol/L (ref 97–108)
Creatinine, Ser: 0.74 mg/dL (ref 0.57–1.00)
GFR calc Af Amer: 94 mL/min/{1.73_m2} (ref >59)
GFR calc non Af Amer: 81 mL/min/{1.73_m2} (ref >59)
Globulin, Total: 1.9 g/dL (ref 1.5–4.5)
Glucose: 98 mg/dL (ref 65–99)
Potassium: 3.6 mmol/L (ref 3.5–5.2)
Sodium: 141 mmol/L (ref 134–144)
Total Bilirubin: 0.3 mg/dL (ref 0.0–1.2)
Total Protein: 6.2 g/dL (ref 6.0–8.5)

## 2013-01-23 LAB — NMR, LIPOPROFILE
Cholesterol: 160 mg/dL (ref <200)
HDL Cholesterol by NMR: 52 mg/dL (ref >=40)
HDL Particle Number: 36.4 umol/L (ref >=30.5)
LDL Particle Number: 1338 nmol/L — ABNORMAL HIGH (ref <1000)
LDL Size: 20.8 nm (ref >20.5)
LDLC SERPL CALC-MCNC: 85 mg/dL (ref <100)
LP-IR Score: 51 — ABNORMAL HIGH (ref <=45)
Small LDL Particle Number: 657 nmol/L — ABNORMAL HIGH (ref <=527)
Triglycerides by NMR: 114 mg/dL (ref <150)

## 2013-01-23 LAB — VITAMIN D 25 HYDROXY (VIT D DEFICIENCY, FRACTURES): Vit D, 25-Hydroxy: 42.7 ng/mL (ref 30.0–100.0)

## 2013-01-26 NOTE — Progress Notes (Signed)
Quick Note:  Lab result at or close to goal. No change in Medications for now. No Change in plans and follow up. ______ 

## 2013-02-01 DIAGNOSIS — M25569 Pain in unspecified knee: Secondary | ICD-10-CM | POA: Diagnosis not present

## 2013-02-01 DIAGNOSIS — M1610 Unilateral primary osteoarthritis, unspecified hip: Secondary | ICD-10-CM | POA: Diagnosis not present

## 2013-02-01 DIAGNOSIS — M47817 Spondylosis without myelopathy or radiculopathy, lumbosacral region: Secondary | ICD-10-CM | POA: Diagnosis not present

## 2013-02-03 DIAGNOSIS — M47817 Spondylosis without myelopathy or radiculopathy, lumbosacral region: Secondary | ICD-10-CM | POA: Diagnosis not present

## 2013-02-03 DIAGNOSIS — M5137 Other intervertebral disc degeneration, lumbosacral region: Secondary | ICD-10-CM | POA: Diagnosis not present

## 2013-02-03 DIAGNOSIS — M5126 Other intervertebral disc displacement, lumbar region: Secondary | ICD-10-CM | POA: Diagnosis not present

## 2013-02-03 DIAGNOSIS — M4120 Other idiopathic scoliosis, site unspecified: Secondary | ICD-10-CM | POA: Diagnosis not present

## 2013-02-03 DIAGNOSIS — M48061 Spinal stenosis, lumbar region without neurogenic claudication: Secondary | ICD-10-CM | POA: Diagnosis not present

## 2013-02-08 DIAGNOSIS — M48061 Spinal stenosis, lumbar region without neurogenic claudication: Secondary | ICD-10-CM | POA: Diagnosis not present

## 2013-02-17 ENCOUNTER — Telehealth: Payer: Self-pay | Admitting: Family Medicine

## 2013-02-18 NOTE — Telephone Encounter (Signed)
Request faxed to [facility] for clearance. Note scanned into chart.

## 2013-02-18 NOTE — Telephone Encounter (Signed)
Done. Approved  

## 2013-03-02 DIAGNOSIS — M47817 Spondylosis without myelopathy or radiculopathy, lumbosacral region: Secondary | ICD-10-CM | POA: Diagnosis not present

## 2013-03-02 DIAGNOSIS — M48061 Spinal stenosis, lumbar region without neurogenic claudication: Secondary | ICD-10-CM | POA: Diagnosis not present

## 2013-03-07 DIAGNOSIS — Z23 Encounter for immunization: Secondary | ICD-10-CM | POA: Diagnosis not present

## 2013-03-15 DIAGNOSIS — M48061 Spinal stenosis, lumbar region without neurogenic claudication: Secondary | ICD-10-CM | POA: Diagnosis not present

## 2013-03-15 DIAGNOSIS — M1610 Unilateral primary osteoarthritis, unspecified hip: Secondary | ICD-10-CM | POA: Diagnosis not present

## 2013-05-03 DIAGNOSIS — H2510 Age-related nuclear cataract, unspecified eye: Secondary | ICD-10-CM | POA: Diagnosis not present

## 2013-05-03 DIAGNOSIS — H25019 Cortical age-related cataract, unspecified eye: Secondary | ICD-10-CM | POA: Diagnosis not present

## 2013-05-03 DIAGNOSIS — H538 Other visual disturbances: Secondary | ICD-10-CM | POA: Diagnosis not present

## 2013-05-16 DIAGNOSIS — M25549 Pain in joints of unspecified hand: Secondary | ICD-10-CM | POA: Diagnosis not present

## 2013-05-16 DIAGNOSIS — M19049 Primary osteoarthritis, unspecified hand: Secondary | ICD-10-CM | POA: Diagnosis not present

## 2013-05-19 ENCOUNTER — Encounter: Payer: Self-pay | Admitting: Family Medicine

## 2013-05-19 ENCOUNTER — Ambulatory Visit (INDEPENDENT_AMBULATORY_CARE_PROVIDER_SITE_OTHER): Payer: Medicare Other | Admitting: Family Medicine

## 2013-05-19 VITALS — BP 148/74 | Temp 97.5°F | Ht 61.0 in | Wt 158.4 lb

## 2013-05-19 DIAGNOSIS — I10 Essential (primary) hypertension: Secondary | ICD-10-CM | POA: Diagnosis not present

## 2013-05-19 DIAGNOSIS — M8580 Other specified disorders of bone density and structure, unspecified site: Secondary | ICD-10-CM

## 2013-05-19 DIAGNOSIS — E785 Hyperlipidemia, unspecified: Secondary | ICD-10-CM

## 2013-05-19 DIAGNOSIS — G459 Transient cerebral ischemic attack, unspecified: Secondary | ICD-10-CM | POA: Diagnosis not present

## 2013-05-19 DIAGNOSIS — M899 Disorder of bone, unspecified: Secondary | ICD-10-CM | POA: Diagnosis not present

## 2013-05-19 NOTE — Progress Notes (Signed)
Patient ID: Andrea Moreno, female   DOB: 11-26-1940, 72 y.o.   MRN: [id number] SUBJECTIVE: CC: Chief Complaint  Patient presents with  . Follow-up    4 MONTH CHRONIC HEALTH RECHECK    HPI:  Patient is here for follow up of hyperlipidemia/HTN/osteopenia/TIA: denies Headache;denies Chest Pain;denies weakness;denies Shortness of Breath and orthopnea;denies Visual changes;denies palpitations;denies cough;denies pedal edema;denies symptoms of TIA or stroke;deniesClaudication symptoms. admits to Compliance with medications; denies Problems with medications.  Appreciated the clearance for back injection. Her back pain has been relieved since the injection.  Past Medical History  Diagnosis Date  . Hypertension   . Allergy   . Osteopenia   . Hyperlipidemia   . Complication of anesthesia     "1950's had appendix out; kicked the RN; no problems since" (10/14/2012)  . Heart murmur   . Pneumonia     "couple of times; last time in the mid 1990's" (10/14/2012)  . Arthritis     "back, right shoulder, hips" (10/14/2012)  . TIA (transient ischemic attack) 10/14/2012   Past Surgical History  Procedure Laterality Date  . Appendectomy  1950's  . Tonsillectomy  1940's  . Back surgery    . Dilation and curettage of uterus  ~ 1975  . Tubal ligation  1960's  . Lumbar disc surgery  1975    "had a disc removed" (10/14/2012)   History   Social History  . Marital Status: Divorced    Spouse Name: N/A    Number of Children: N/A  . Years of Education: N/A   Occupational History  . Not on file.   Social History Main Topics  . Smoking status: Former Smoker -- 0.30 packs/day for 5 years    Types: Cigarettes    Quit date: 08/31/1985  . Smokeless tobacco: Never Used  . Alcohol Use: 6.0 oz/week    10 Glasses of wine per week     Comment: 10/14/2012 "~ 6 oz wine/day"  . Drug Use: No  . Sexual Activity: No   Other Topics Concern  . Not on file   Social History Narrative  . No narrative on  file   Family History  Problem Relation Age of Onset  . Heart disease Mother   . Diabetes Mother    Current Outpatient Prescriptions on File Prior to Visit  Medication Sig Dispense Refill  . alendronate (FOSAMAX) 70 MG tablet Take 1 tablet (70 mg total) by mouth every 7 (seven) days. Sundays only  4 tablet  11  . aspirin EC 325 MG EC tablet Take 1 tablet (325 mg total) by mouth daily.  30 tablet  0  . calcium carbonate (TUMS - DOSED IN MG ELEMENTAL CALCIUM) 500 MG chewable tablet Chew 1 tablet by mouth daily.      . cetirizine (ZYRTEC) 10 MG tablet Take 10 mg by mouth daily.      . cholecalciferol (VITAMIN D) 400 UNITS TABS Take 400-800 Units by mouth daily. Tues, Thurs, Sat-2 tabs daily, all other days 1 tab      . cyclobenzaprine (FLEXERIL) 5 MG tablet Take 5 mg by mouth 2 (two) times daily as needed.       . ezetimibe (ZETIA) 10 MG tablet TAKE 1 TABLET DAILY  30 tablet  11  . fish oil-omega-3 fatty acids 1000 MG capsule Take 1 g by mouth 2 (two) times daily.       Marland Kitchen losartan-hydrochlorothiazide (HYZAAR) 100-25 MG per tablet TAKE 1 TABLET DAILY  30 tablet  11  .  Milk Thistle 1000 MG CAPS Take 1,000 mg by mouth at bedtime.       Marland Kitchen OVER THE COUNTER MEDICATION Take 1 capsule by mouth 3 (three) times daily. "Replenex"      . TURMERIC PO Take 450 mg by mouth 2 (two) times daily.       . VOLTAREN 1 % GEL        No current facility-administered medications on file prior to visit.   Allergies  Allergen Reactions  . Celebrex [Celecoxib]     rash  . Demerol [Meperidine]    Immunization History  Administered Date(s) Administered  . Influenza, High Dose Seasonal PF 03/09/2013  . Pneumococcal Polysaccharide-23 10/15/2012   Prior to Admission medications   Medication Sig Start Date End Date Taking? Authorizing Provider  alendronate (FOSAMAX) 70 MG tablet Take 1 tablet (70 mg total) by mouth every 7 (seven) days. Sundays only 01/21/13  Yes Ileana Ladd, MD  aspirin EC 325 MG EC tablet Take  1 tablet (325 mg total) by mouth daily. 10/15/12  Yes Marinda Elk, MD  calcium carbonate (TUMS - DOSED IN MG ELEMENTAL CALCIUM) 500 MG chewable tablet Chew 1 tablet by mouth daily.   Yes Historical Provider, MD  cetirizine (ZYRTEC) 10 MG tablet Take 10 mg by mouth daily.   Yes Historical Provider, MD  cholecalciferol (VITAMIN D) 400 UNITS TABS Take 400-800 Units by mouth daily. Tues, Thurs, Sat-2 tabs daily, all other days 1 tab   Yes Historical Provider, MD  cyclobenzaprine (FLEXERIL) 5 MG tablet Take 5 mg by mouth 2 (two) times daily as needed.    Yes Historical Provider, MD  ezetimibe (ZETIA) 10 MG tablet TAKE 1 TABLET DAILY 01/21/13  Yes Ileana Ladd, MD  fish oil-omega-3 fatty acids 1000 MG capsule Take 1 g by mouth 2 (two) times daily.    Yes Historical Provider, MD  losartan-hydrochlorothiazide (HYZAAR) 100-25 MG per tablet TAKE 1 TABLET DAILY 01/21/13  Yes Ileana Ladd, MD  Milk Thistle 1000 MG CAPS Take 1,000 mg by mouth at bedtime.    Yes Historical Provider, MD  OVER THE COUNTER MEDICATION Take 1 capsule by mouth 3 (three) times daily. "Replenex"   Yes Historical Provider, MD  Potassium 99 MG TABS Take 99 mg by mouth daily.   Yes Historical Provider, MD  TURMERIC PO Take 450 mg by mouth 2 (two) times daily.    Yes Historical Provider, MD  VOLTAREN 1 % GEL  12/13/12  Yes Historical Provider, MD     ROS: As above in the HPI. All other systems are stable or negative.  OBJECTIVE: APPEARANCE:  Patient in no acute distress.The patient appeared well nourished and normally developed. Acyanotic. Waist: VITAL SIGNS:BP 148/74  Temp(Src) 97.5 F (36.4 C) (Oral)  Ht 5\' 1"  (1.549 m)  Wt 158 lb 6.4 oz (71.85 kg)  BMI 29.94 kg/m2  WF overweight  SKIN: warm and  Dry without overt rashes, tattoos and scars  HEAD and Neck: without JVD, Head and scalp: normal Eyes:No scleral icterus. Fundi normal, eye movements normal. Ears: Auricle normal, canal normal, Tympanic membranes  normal, insufflation normal. Nose: normal Throat: normal Neck & thyroid: normal  CHEST & LUNGS: Chest wall: normal Lungs: Clear  CVS: Reveals the PMI to be normally located. Regular rhythm, First and Second Heart sounds are normal,  absence of murmurs, rubs or gallops. Peripheral vasculature: Radial pulses: normal Dorsal pedis pulses: normal Posterior pulses: normal  ABDOMEN:  Appearance: overweight Benign, no organomegaly,  no masses, no Abdominal Aortic enlargement. No Guarding , no rebound. No Bruits. Bowel sounds: normal  RECTAL: N/A GU: N/A  EXTREMETIES: nonedematous.  NEUROLOGIC: oriented to time,place and person; nonfocal.  Results for orders placed in visit on 01/21/13  CMP14+EGFR      Result Value Range   Glucose 98  65 - 99 mg/dL   BUN 18  8 - 27 mg/dL   Creatinine, Ser 1.91  0.57 - 1.00 mg/dL   GFR calc non Af Amer 81  >59 mL/min/1.73   GFR calc Af Amer 94  >59 mL/min/1.73   BUN/Creatinine Ratio 24  11 - 26   Sodium 141  134 - 144 mmol/L   Potassium 3.6  3.5 - 5.2 mmol/L   Chloride 103  97 - 108 mmol/L   CO2 23  18 - 29 mmol/L   Calcium 9.2  8.6 - 10.2 mg/dL   Total Protein 6.2  6.0 - 8.5 g/dL   Albumin 4.3  3.5 - 4.8 g/dL   Globulin, Total 1.9  1.5 - 4.5 g/dL   Albumin/Globulin Ratio 2.3  1.1 - 2.5   Total Bilirubin 0.3  0.0 - 1.2 mg/dL   Alkaline Phosphatase 63  39 - 117 IU/L   AST 15  0 - 40 IU/L   ALT 19  0 - 32 IU/L  NMR, LIPOPROFILE      Result Value Range   LDL Particle Number [id number] (*) <1000 nmol/L   LDLC SERPL CALC-MCNC 85  <100 mg/dL   HDL Cholesterol by NMR 52  >=40 mg/dL   Triglycerides by NMR 114  <150 mg/dL   Cholesterol 478  <295 mg/dL   HDL Particle Number 62.1  >=30.8 umol/L   Small LDL Particle Number [id number] (*) <=527 nmol/L   LDL Size 20.8  >20.5 nm   LP-IR Score 51 (*) <=45  VITAMIN D 25 HYDROXY      Result Value Range   Vit D, 25-Hydroxy 42.7  30.0 - 100.0 ng/mL    ASSESSMENT:  HTN (hypertension) - Plan:  CMP14+EGFR  Hyperlipidemia - Plan: CMP14+EGFR, Lipid panel  TIA (transient ischemic attack)  Osteopenia  PLAN: Low fat , low salt diet Keep active. Orders Placed This Encounter  Procedures  . CMP14+EGFR    Standing Status: Future     Number of Occurrences:      Standing Expiration Date: 05/19/2014    Order Specific Question:  Has the patient fasted?    Answer:  Yes  . Lipid panel    Standing Status: Future     Number of Occurrences:      Standing Expiration Date: 05/19/2014    Order Specific Question:  Has the patient fasted?    Answer:  Yes   Meds ordered this encounter  Medications  . Potassium 99 MG TABS    Sig: Take 99 mg by mouth daily.   There are no discontinued medications. Return in about 4 months (around 09/17/2013) for Recheck medical problems.  Oshea Percival P. [place] Charon, M.D.

## 2013-05-23 ENCOUNTER — Other Ambulatory Visit (INDEPENDENT_AMBULATORY_CARE_PROVIDER_SITE_OTHER): Payer: Medicare Other

## 2013-05-23 DIAGNOSIS — E785 Hyperlipidemia, unspecified: Secondary | ICD-10-CM

## 2013-05-23 DIAGNOSIS — M19049 Primary osteoarthritis, unspecified hand: Secondary | ICD-10-CM | POA: Diagnosis not present

## 2013-05-23 DIAGNOSIS — I10 Essential (primary) hypertension: Secondary | ICD-10-CM

## 2013-05-23 NOTE — Progress Notes (Signed)
Pt came in for labs.

## 2013-05-24 LAB — LIPID PANEL
Chol/HDL Ratio: 2.5 ratio units (ref 0.0–4.4)
Cholesterol, Total: 160 mg/dL (ref 100–199)
HDL: 64 mg/dL (ref >39)
LDL Calculated: 79 mg/dL (ref 0–99)
Triglycerides: 83 mg/dL (ref 0–149)
VLDL Cholesterol Cal: 17 mg/dL (ref 5–40)

## 2013-05-24 LAB — CMP14+EGFR
ALT: 26 IU/L (ref 0–32)
AST: 16 IU/L (ref 0–40)
Albumin/Globulin Ratio: 2.6 — ABNORMAL HIGH (ref 1.1–2.5)
Albumin: 4.7 g/dL (ref 3.5–4.8)
Alkaline Phosphatase: 55 IU/L (ref 39–117)
BUN/Creatinine Ratio: 28 — ABNORMAL HIGH (ref 11–26)
BUN: 21 mg/dL (ref 8–27)
CO2: 23 mmol/L (ref 18–29)
Calcium: 9.4 mg/dL (ref 8.6–10.2)
Chloride: 102 mmol/L (ref 97–108)
Creatinine, Ser: 0.76 mg/dL (ref 0.57–1.00)
GFR calc Af Amer: 91 mL/min/{1.73_m2} (ref >59)
GFR calc non Af Amer: 79 mL/min/{1.73_m2} (ref >59)
Globulin, Total: 1.8 g/dL (ref 1.5–4.5)
Glucose: 98 mg/dL (ref 65–99)
Potassium: 3.8 mmol/L (ref 3.5–5.2)
Sodium: 141 mmol/L (ref 134–144)
Total Bilirubin: 0.4 mg/dL (ref 0.0–1.2)
Total Protein: 6.5 g/dL (ref 6.0–8.5)

## 2013-06-30 ENCOUNTER — Telehealth: Payer: Self-pay | Admitting: Family Medicine

## 2013-07-01 NOTE — Telephone Encounter (Signed)
Done

## 2013-07-01 NOTE — Telephone Encounter (Signed)
Form faxed back as requested to dr Ferman Hamming as requested

## 2013-07-15 DIAGNOSIS — M48061 Spinal stenosis, lumbar region without neurogenic claudication: Secondary | ICD-10-CM | POA: Diagnosis not present

## 2013-08-10 ENCOUNTER — Ambulatory Visit (INDEPENDENT_AMBULATORY_CARE_PROVIDER_SITE_OTHER): Payer: Medicare Other | Admitting: *Deleted

## 2013-08-10 DIAGNOSIS — Z23 Encounter for immunization: Secondary | ICD-10-CM

## 2013-09-22 ENCOUNTER — Ambulatory Visit (INDEPENDENT_AMBULATORY_CARE_PROVIDER_SITE_OTHER): Payer: Medicare Other | Admitting: Family Medicine

## 2013-09-22 ENCOUNTER — Encounter: Payer: Self-pay | Admitting: Family Medicine

## 2013-09-22 VITALS — BP 110/78 | HR 70 | Temp 98.1°F | Ht 61.0 in | Wt 161.6 lb

## 2013-09-22 DIAGNOSIS — I10 Essential (primary) hypertension: Secondary | ICD-10-CM

## 2013-09-22 DIAGNOSIS — IMO0001 Reserved for inherently not codable concepts without codable children: Secondary | ICD-10-CM

## 2013-09-22 DIAGNOSIS — E785 Hyperlipidemia, unspecified: Secondary | ICD-10-CM

## 2013-09-22 DIAGNOSIS — G459 Transient cerebral ischemic attack, unspecified: Secondary | ICD-10-CM | POA: Diagnosis not present

## 2013-09-22 DIAGNOSIS — M899 Disorder of bone, unspecified: Secondary | ICD-10-CM | POA: Diagnosis not present

## 2013-09-22 DIAGNOSIS — M8580 Other specified disorders of bone density and structure, unspecified site: Secondary | ICD-10-CM

## 2013-09-22 DIAGNOSIS — E559 Vitamin D deficiency, unspecified: Secondary | ICD-10-CM | POA: Diagnosis not present

## 2013-09-22 DIAGNOSIS — M949 Disorder of cartilage, unspecified: Secondary | ICD-10-CM

## 2013-09-22 DIAGNOSIS — M7918 Myalgia, other site: Secondary | ICD-10-CM

## 2013-09-22 NOTE — Patient Instructions (Signed)
DASH Diet The DASH diet stands for "Dietary Approaches to Stop Hypertension." It is a healthy eating plan that has been shown to reduce high blood pressure (hypertension) in as little as 14 days, while also possibly providing other significant health benefits. These other health benefits include reducing the risk of breast cancer after menopause and reducing the risk of type 2 diabetes, heart disease, colon cancer, and stroke. Health benefits also include weight loss and slowing kidney failure in patients with chronic kidney disease.  DIET GUIDELINES  Limit salt (sodium). Your diet should contain less than 1500 mg of sodium daily.  Limit refined or processed carbohydrates. Your diet should include mostly whole grains. Desserts and added sugars should be used sparingly.  Include small amounts of heart-healthy fats. These types of fats include nuts, oils, and tub margarine. Limit saturated and trans fats. These fats have been shown to be harmful in the body. CHOOSING FOODS  The following food groups are based on a 2000 calorie diet. See your Registered Dietitian for individual calorie needs. Grains and Grain Products (6 to 8 servings daily)  Eat More Often: Whole-wheat bread, brown rice, whole-grain or wheat pasta, quinoa, popcorn without added fat or salt (air popped).  Eat Less Often: White bread, white pasta, white rice, cornbread. Vegetables (4 to 5 servings daily)  Eat More Often: Fresh, frozen, and canned vegetables. Vegetables may be raw, steamed, roasted, or grilled with a minimal amount of fat.  Eat Less Often/Avoid: Creamed or fried vegetables. Vegetables in a cheese sauce. Fruit (4 to 5 servings daily)  Eat More Often: All fresh, canned (in natural juice), or frozen fruits. Dried fruits without added sugar. One hundred percent fruit juice ( cup [237 mL] daily).  Eat Less Often: Dried fruits with added sugar. Canned fruit in light or heavy syrup. [employer], Fish, and Poultry (2  servings or less daily. One serving is 3 to 4 oz [85-114 g]).  Eat More Often: Ninety percent or leaner ground beef, tenderloin, sirloin. Round cuts of beef, chicken breast, [place] breast. All fish. Grill, bake, or broil your meat. Nothing should be fried.  Eat Less Often/Avoid: Fatty cuts of meat, [place], or chicken leg, thigh, or wing. Fried cuts of meat or fish. Dairy (2 to 3 servings)  Eat More Often: Low-fat or fat-free milk, low-fat plain or light yogurt, reduced-fat or part-skim cheese.  Eat Less Often/Avoid: Milk (whole, 2%).Whole milk yogurt. Full-fat cheeses. Nuts, Seeds, and Legumes (4 to 5 servings per week)  Eat More Often: All without added salt.  Eat Less Often/Avoid: Salted nuts and seeds, canned beans with added salt. Fats and Sweets (limited)  Eat More Often: Vegetable oils, tub margarines without trans fats, sugar-free gelatin. Mayonnaise and salad dressings.  Eat Less Often/Avoid: Coconut oils, palm oils, butter, stick margarine, cream, half and half, cookies, candy, pie. FOR MORE INFORMATION The Dash Diet Eating Plan: www.dashdiet.org Document Released: 05/08/2011 Document Revised: 08/11/2011 Document Reviewed: 05/08/2011 [facility] Patient Information 2014 [place], [place].        Dr Paula Libra Recommendations  For nutrition information, I recommend books:  1).Eat to Live by Dr Excell Seltzer. 2).Prevent and Reverse Heart Disease by Dr Karl Luke. 3) Dr Janene Harvey Book:  Program to Reverse Diabetes  Exercise recommendations are:  If unable to walk, then the patient can exercise in a chair 3 times a day. By flapping arms like a bird gently and raising legs outwards to the front.  If ambulatory, the patient can go for  walks for 30 minutes 3 times a week. Then increase the intensity and duration as tolerated.  Goal is to try to attain exercise frequency to 5 times a week.  If applicable: Best to perform resistance exercises (machines or  weights) 2 days a week and cardio type exercises 3 days per week.

## 2013-09-22 NOTE — Progress Notes (Signed)
Patient ID: Andrea Moreno, female   DOB: 1940-06-13, 73 y.o.   MRN: [id number] SUBJECTIVE: CC: Chief Complaint  Patient presents with  . Follow-up    4 month follow up chronic problems    HPI: Patient is here for follow up of hyperlipidemia/Hypertension/TIA: denies Headache;denies Chest Pain;denies weakness;denies Shortness of Breath and orthopnea;denies Visual changes;denies palpitations;denies cough;denies pedal edema;denies symptoms of TIA or stroke;deniesClaudication symptoms. admits to Compliance with medications; denies Problems with medications. Pains in muscles and back and fingers and joints  Past Medical History  Diagnosis Date  . Hypertension   . Allergy   . Osteopenia   . Hyperlipidemia   . Complication of anesthesia     "1950's had appendix out; kicked the RN; no problems since" (10/14/2012)  . Heart murmur   . Pneumonia     "couple of times; last time in the mid 1990's" (10/14/2012)  . Arthritis     "back, right shoulder, hips" (10/14/2012)  . TIA (transient ischemic attack) 10/14/2012   Past Surgical History  Procedure Laterality Date  . Appendectomy  1950's  . Tonsillectomy  1940's  . Back surgery    . Dilation and curettage of uterus  ~ 1975  . Tubal ligation  1960's  . Lumbar disc surgery  1975    "had a disc removed" (10/14/2012)   History   Social History  . Marital Status: Divorced    Spouse Name: N/A    Number of Children: N/A  . Years of Education: N/A   Occupational History  . Not on file.   Social History Main Topics  . Smoking status: Former Smoker -- 0.30 packs/day for 5 years    Types: Cigarettes    Quit date: 08/31/1985  . Smokeless tobacco: Never Used  . Alcohol Use: 6.0 oz/week    10 Glasses of wine per week     Comment: 10/14/2012 "~ 6 oz wine/day"  . Drug Use: No  . Sexual Activity: No   Other Topics Concern  . Not on file   Social History Narrative  . No narrative on file   Family History  Problem Relation Age of  Onset  . Heart disease Mother   . Diabetes Mother    Current Outpatient Prescriptions on File Prior to Visit  Medication Sig Dispense Refill  . alendronate (FOSAMAX) 70 MG tablet Take 1 tablet (70 mg total) by mouth every 7 (seven) days. Sundays only  4 tablet  11  . aspirin EC 325 MG EC tablet Take 1 tablet (325 mg total) by mouth daily.  30 tablet  0  . calcium carbonate (TUMS - DOSED IN MG ELEMENTAL CALCIUM) 500 MG chewable tablet Chew 1 tablet by mouth daily.      . cetirizine (ZYRTEC) 10 MG tablet Take 10 mg by mouth daily.      . cholecalciferol (VITAMIN D) 400 UNITS TABS Take 400-800 Units by mouth daily. Tues, Thurs, Sat-2 tabs daily, all other days 1 tab      . cyclobenzaprine (FLEXERIL) 5 MG tablet Take 5 mg by mouth 2 (two) times daily as needed.       . ezetimibe (ZETIA) 10 MG tablet TAKE 1 TABLET DAILY  30 tablet  11  . fish oil-omega-3 fatty acids 1000 MG capsule Take 1 g by mouth 2 (two) times daily.       Marland Kitchen losartan-hydrochlorothiazide (HYZAAR) 100-25 MG per tablet TAKE 1 TABLET DAILY  30 tablet  11  . Milk Thistle 1000 MG CAPS  Take 1,000 mg by mouth at bedtime.       Marland Kitchen OVER THE COUNTER MEDICATION Take 1 capsule by mouth 3 (three) times daily. "Replenex"      . Potassium 99 MG TABS Take 99 mg by mouth daily.      . TURMERIC PO Take 450 mg by mouth 2 (two) times daily.       . VOLTAREN 1 % GEL        No current facility-administered medications on file prior to visit.   Allergies  Allergen Reactions  . Celebrex [Celecoxib]     rash  . Demerol [Meperidine]    Immunization History  Administered Date(s) Administered  . Influenza, High Dose Seasonal PF 03/09/2013  . Pneumococcal Polysaccharide-23 10/15/2012  . Tdap 08/10/2013  . Zoster 06/02/2004   Prior to Admission medications   Medication Sig Start Date End Date Taking? Authorizing Provider  alendronate (FOSAMAX) 70 MG tablet Take 1 tablet (70 mg total) by mouth every 7 (seven) days. Sundays only 01/21/13  Yes  Vernie Shanks, MD  aspirin EC 325 MG EC tablet Take 1 tablet (325 mg total) by mouth daily. 10/15/12  Yes Charlynne Cousins, MD  calcium carbonate (TUMS - DOSED IN MG ELEMENTAL CALCIUM) 500 MG chewable tablet Chew 1 tablet by mouth daily.   Yes Historical Provider, MD  cetirizine (ZYRTEC) 10 MG tablet Take 10 mg by mouth daily.   Yes Historical Provider, MD  cholecalciferol (VITAMIN D) 400 UNITS TABS Take 400-800 Units by mouth daily. Tues, Thurs, Sat-2 tabs daily, all other days 1 tab   Yes Historical Provider, MD  cyclobenzaprine (FLEXERIL) 5 MG tablet Take 5 mg by mouth 2 (two) times daily as needed.    Yes Historical Provider, MD  ezetimibe (ZETIA) 10 MG tablet TAKE 1 TABLET DAILY 01/21/13  Yes Vernie Shanks, MD  fish oil-omega-3 fatty acids 1000 MG capsule Take 1 g by mouth 2 (two) times daily.    Yes Historical Provider, MD  losartan-hydrochlorothiazide (HYZAAR) 100-25 MG per tablet TAKE 1 TABLET DAILY 01/21/13  Yes Vernie Shanks, MD  Milk Thistle 1000 MG CAPS Take 1,000 mg by mouth at bedtime.    Yes Historical Provider, MD  OVER THE COUNTER MEDICATION Take 1 capsule by mouth 3 (three) times daily. "Replenex"   Yes Historical Provider, MD  Potassium 99 MG TABS Take 99 mg by mouth daily.   Yes Historical Provider, MD  TURMERIC PO Take 450 mg by mouth 2 (two) times daily.    Yes Historical Provider, MD  VOLTAREN 1 % GEL  12/13/12  Yes Historical Provider, MD     ROS: As above in the HPI. All other systems are stable or negative.  OBJECTIVE: APPEARANCE:  Patient in no acute distress.The patient appeared well nourished and normally developed. Acyanotic. Waist: VITAL SIGNS:BP 110/78  Pulse 70  Temp(Src) 98.1 F (36.7 C) (Oral)  Ht _0  (1.549 m)  Wt 161 lb 9.6 oz (73.301 kg)  BMI 30.55 kg/m2 Elderly WF obese  SKIN: warm and  Dry without overt rashes, tattoos and scars  HEAD and Neck: without JVD, Head and scalp: normal Eyes:No scleral icterus. Fundi normal, eye movements  normal. Ears: Auricle normal, canal normal, Tympanic membranes normal, insufflation normal. Nose: normal Throat: normal Neck & thyroid: normal  CHEST & LUNGS: Chest wall: normal Lungs: Clear  CVS: Reveals the PMI to be normally located. Regular rhythm, First and Second Heart sounds are normal,  absence of murmurs, rubs  or gallops. Peripheral vasculature: Radial pulses: normal Dorsal pedis pulses: normal Posterior pulses: normal  ABDOMEN:  Appearance: obese Benign, no organomegaly, no masses, no Abdominal Aortic enlargement. No Guarding , no rebound. No Bruits. Bowel sounds: normal  RECTAL: N/A GU: N/A  EXTREMETIES: nonedematous.  MUSCULOSKELETAL:  Spine: reduced ROM Joints: arthritic changes of fingers.   NEUROLOGIC: oriented to time,place and person; nonfocal. Cranial Nerves are normal. Results for orders placed in visit on 05/23/13  LIPID PANEL      Result Value Ref Range   Cholesterol, Total 160  100 - 199 mg/dL   Triglycerides 83  0 - 149 mg/dL   HDL 64  >39 mg/dL   VLDL Cholesterol Cal 17  5 - 40 mg/dL   LDL Calculated 79  0 - 99 mg/dL   Chol/HDL Ratio 2.5  0.0 - 4.4 ratio units  CMP14+EGFR      Result Value Ref Range   Glucose 98  65 - 99 mg/dL   BUN 21  8 - 27 mg/dL   Creatinine, Ser 0.76  0.57 - 1.00 mg/dL   GFR calc non Af Amer 79  >59 mL/min/1.73   GFR calc Af Amer 91  >59 mL/min/1.73   BUN/Creatinine Ratio 28 (*) 11 - 26   Sodium 141  134 - 144 mmol/L   Potassium 3.8  3.5 - 5.2 mmol/L   Chloride 102  97 - 108 mmol/L   CO2 23  18 - 29 mmol/L   Calcium 9.4  8.6 - 10.2 mg/dL   Total Protein 6.5  6.0 - 8.5 g/dL   Albumin 4.7  3.5 - 4.8 g/dL   Globulin, Total 1.8  1.5 - 4.5 g/dL   Albumin/Globulin Ratio 2.6 (*) 1.1 - 2.5   Total Bilirubin 0.4  0.0 - 1.2 mg/dL   Alkaline Phosphatase 55  39 - 117 IU/L   AST 16  0 - 40 IU/L   ALT 26  0 - 32 IU/L    ASSESSMENT:  Hyperlipidemia - Plan: Lipid panel  HTN (hypertension) - Plan: CMP14+EGFR  TIA  (transient ischemic attack)  Osteopenia  Muscle ache of extremity - Plan: TSH, Vit D  25 hydroxy (rtn osteoporosis monitoring)  PLAN:      Dr Paula Libra Recommendations Ash diet in the AVS. d For nutrition information, I recommend books:  1).Eat to Live by Dr Excell Seltzer. 2).Prevent and Reverse Heart Disease by Dr Karl Luke. 3) Dr Janene Harvey Book:  Program to Reverse Diabetes  Exercise recommendations are:  If unable to walk, then the patient can exercise in a chair 3 times a day. By flapping arms like a bird gently and raising legs outwards to the front.  If ambulatory, the patient can go for walks for 30 minutes 3 times a week. Then increase the intensity and duration as tolerated.  Goal is to try to attain exercise frequency to 5 times a week.  If applicable: Best to perform resistance exercises (machines or weights) 2 days a week and cardio type exercises 3 days per week.   Orders Placed This Encounter  Procedures  . CMP14+EGFR  . Lipid panel  . TSH  . Vit D  25 hydroxy (rtn osteoporosis monitoring)   No orders of the defined types were placed in this encounter.   There are no discontinued medications. Return in about 4 months (around 01/22/2014) for Recheck medical problems.  Kenzli Barritt P. Jacelyn Grip, M.D.

## 2013-09-23 LAB — CMP14+EGFR
ALT: 17 IU/L (ref 0–32)
AST: 16 IU/L (ref 0–40)
Albumin/Globulin Ratio: 2.2 (ref 1.1–2.5)
Albumin: 4.4 g/dL (ref 3.5–4.8)
Alkaline Phosphatase: 63 IU/L (ref 39–117)
BUN/Creatinine Ratio: 24 (ref 11–26)
BUN: 18 mg/dL (ref 8–27)
CO2: 23 mmol/L (ref 18–29)
Calcium: 9.3 mg/dL (ref 8.7–10.3)
Chloride: 101 mmol/L (ref 97–108)
Creatinine, Ser: 0.74 mg/dL (ref 0.57–1.00)
GFR calc Af Amer: 94 mL/min/{1.73_m2} (ref >59)
GFR calc non Af Amer: 81 mL/min/{1.73_m2} (ref >59)
Globulin, Total: 2 g/dL (ref 1.5–4.5)
Glucose: 98 mg/dL (ref 65–99)
Potassium: 4.1 mmol/L (ref 3.5–5.2)
Sodium: 141 mmol/L (ref 134–144)
Total Bilirubin: 0.4 mg/dL (ref 0.0–1.2)
Total Protein: 6.4 g/dL (ref 6.0–8.5)

## 2013-09-23 LAB — LIPID PANEL
Chol/HDL Ratio: 2.8 ratio units (ref 0.0–4.4)
Cholesterol, Total: 158 mg/dL (ref 100–199)
HDL: 57 mg/dL (ref >39)
LDL Calculated: 86 mg/dL (ref 0–99)
Triglycerides: 77 mg/dL (ref 0–149)
VLDL Cholesterol Cal: 15 mg/dL (ref 5–40)

## 2013-09-23 LAB — TSH: TSH: 3.59 u[IU]/mL (ref 0.450–4.500)

## 2013-09-23 LAB — VITAMIN D 25 HYDROXY (VIT D DEFICIENCY, FRACTURES): Vit D, 25-Hydroxy: 37 ng/mL (ref 30.0–100.0)

## 2013-11-07 DIAGNOSIS — M6530 Trigger finger, unspecified finger: Secondary | ICD-10-CM | POA: Diagnosis not present

## 2014-01-16 ENCOUNTER — Ambulatory Visit (INDEPENDENT_AMBULATORY_CARE_PROVIDER_SITE_OTHER): Payer: Medicare Other | Admitting: Family

## 2014-01-16 ENCOUNTER — Encounter: Payer: Self-pay | Admitting: Family

## 2014-01-16 VITALS — BP 124/80 | Ht 62.0 in | Wt 159.8 lb

## 2014-01-16 DIAGNOSIS — M8580 Other specified disorders of bone density and structure, unspecified site: Secondary | ICD-10-CM

## 2014-01-16 DIAGNOSIS — E785 Hyperlipidemia, unspecified: Secondary | ICD-10-CM

## 2014-01-16 DIAGNOSIS — M899 Disorder of bone, unspecified: Secondary | ICD-10-CM | POA: Diagnosis not present

## 2014-01-16 DIAGNOSIS — M62830 Muscle spasm of back: Secondary | ICD-10-CM

## 2014-01-16 DIAGNOSIS — I10 Essential (primary) hypertension: Secondary | ICD-10-CM

## 2014-01-16 DIAGNOSIS — M5380 Other specified dorsopathies, site unspecified: Secondary | ICD-10-CM

## 2014-01-16 DIAGNOSIS — E559 Vitamin D deficiency, unspecified: Secondary | ICD-10-CM

## 2014-01-16 DIAGNOSIS — M949 Disorder of cartilage, unspecified: Secondary | ICD-10-CM | POA: Diagnosis not present

## 2014-01-16 MED ORDER — LOSARTAN POTASSIUM-HCTZ 100-25 MG PO TABS: ORAL_TABLET | ORAL | Status: DC

## 2014-01-16 MED ORDER — ALENDRONATE SODIUM 70 MG PO TABS: 70.0000 mg | ORAL_TABLET | ORAL | Status: DC

## 2014-01-16 MED ORDER — CYCLOBENZAPRINE HCL 5 MG PO TABS: 5.0000 mg | ORAL_TABLET | Freq: Two times a day (BID) | ORAL | Status: DC | PRN

## 2014-01-16 MED ORDER — EZETIMIBE 10 MG PO TABS: ORAL_TABLET | ORAL | Status: DC

## 2014-01-16 NOTE — Progress Notes (Signed)
Subjective:    Patient ID: Andrea Moreno, female    DOB: Jul 15, 1940, 73 y.o.   MRN: [id number]  Hyperlipidemia This is a chronic problem. The current episode started more than 1 year ago. The problem is controlled. Recent lipid tests were reviewed and are normal. She has no history of diabetes or hypothyroidism. Pertinent negatives include no leg pain, myalgias or shortness of breath. Current antihyperlipidemic treatment includes ezetimibe. The current treatment provides mild improvement of lipids. Risk factors for coronary artery disease include dyslipidemia, hypertension, obesity and post-menopausal.  Hypertension This is a chronic problem. The current episode started more than 1 year ago. The problem has been resolved since onset. The problem is controlled. Pertinent negatives include no anxiety, headaches, malaise/fatigue, palpitations, peripheral edema or shortness of breath. Risk factors for coronary artery disease include dyslipidemia, family history and post-menopausal state. Past treatments include angiotensin blockers and diuretics. The current treatment provides significant improvement. There is no history of kidney disease, CAD/MI, CVA, heart failure or a thyroid problem. There is no history of sleep apnea.      Review of Systems  Constitutional: Negative.  Negative for malaise/fatigue.  HENT: Negative.   Eyes: Negative.   Respiratory: Negative.  Negative for shortness of breath.   Cardiovascular: Negative.  Negative for palpitations.  Gastrointestinal: Negative.   Endocrine: Negative.   Genitourinary: Negative.   Musculoskeletal: Negative.  Negative for myalgias.  Neurological: Negative.  Negative for headaches.  Hematological: Negative.   Psychiatric/Behavioral: Negative.   All other systems reviewed and are negative.      Objective:   Physical Exam  Vitals reviewed. Constitutional: She is oriented to person, place, and time. She appears well-developed and  well-nourished. No distress.  HENT:  Head: Normocephalic and atraumatic.  Right Ear: External ear normal.  Left Ear: External ear normal.  Nose: Nose normal.  Mouth/Throat: Oropharynx is clear and moist.  Eyes: Pupils are equal, round, and reactive to light.  Neck: Normal range of motion. Neck supple. No thyromegaly present.  Cardiovascular: Normal rate, regular rhythm, normal heart sounds and intact distal pulses.   No murmur heard. Pulmonary/Chest: Effort normal and breath sounds normal. No respiratory distress. She has no wheezes.  Abdominal: Soft. Bowel sounds are normal. She exhibits no distension. There is no tenderness.  Musculoskeletal: Normal range of motion. She exhibits no edema and no tenderness.  Neurological: She is alert and oriented to person, place, and time. She has normal reflexes. No cranial nerve deficit.  Skin: Skin is warm and dry.  Psychiatric: She has a normal mood and affect. Her behavior is normal. Judgment and thought content normal.     BP 124/80  Ht $R'5\' 2"'Ch$  (1.575 m)  Wt 159 lb 12.8 oz (72.485 kg)  BMI 29.22 kg/m2      Assessment & Plan:  1. Essential hypertension - CMP14+EGFR - losartan-hydrochlorothiazide (HYZAAR) 100-25 MG per tablet; TAKE 1 TABLET DAILY  Dispense: 30 tablet; Refill: 11  2. Osteopenia - CMP14+EGFR - Vit D  25 hydroxy (rtn osteoporosis monitoring) - alendronate (FOSAMAX) 70 MG tablet; Take 1 tablet (70 mg total) by mouth every 7 (seven) days. Sundays only  Dispense: 4 tablet; Refill: 11  3. Hyperlipidemia - CMP14+EGFR - Lipid panel - ezetimibe (ZETIA) 10 MG tablet; TAKE 1 TABLET DAILY  Dispense: 30 tablet; Refill: 11  4. Unspecified vitamin D deficiency - CMP14+EGFR - Vit D  25 hydroxy (rtn osteoporosis monitoring)  5. Back spasm - cyclobenzaprine (FLEXERIL) 5 MG tablet; Take 1 tablet (  5 mg total) by mouth 2 (two) times daily as needed.  Dispense: 30 tablet; Refill: 2   Continue all meds Labs pending Health Maintenance  reviewed Diet and exercise encouraged RTO 6 months  Evelina Dun, FNP

## 2014-01-16 NOTE — Patient Instructions (Signed)

## 2014-01-17 ENCOUNTER — Other Ambulatory Visit: Payer: Self-pay | Admitting: Family

## 2014-01-17 LAB — CMP14+EGFR
ALK PHOS: 53 IU/L (ref 39–117)
ALT: 15 IU/L (ref 0–32)
AST: 16 IU/L (ref 0–40)
Albumin/Globulin Ratio: 1.9 (ref 1.1–2.5)
Albumin: 4.2 g/dL (ref 3.5–4.8)
BILIRUBIN TOTAL: 0.5 mg/dL (ref 0.0–1.2)
BUN / CREAT RATIO: 19 (ref 11–26)
BUN: 16 mg/dL (ref 8–27)
CHLORIDE: 102 mmol/L (ref 97–108)
CO2: 26 mmol/L (ref 18–29)
Calcium: 9.3 mg/dL (ref 8.7–10.3)
Creatinine, Ser: 0.83 mg/dL (ref 0.57–1.00)
GFR calc non Af Amer: 70 mL/min/{1.73_m2} (ref >59)
GFR, EST AFRICAN AMERICAN: 81 mL/min/{1.73_m2} (ref >59)
GLOBULIN, TOTAL: 2.2 g/dL (ref 1.5–4.5)
Glucose: 100 mg/dL — ABNORMAL HIGH (ref 65–99)
Potassium: 4 mmol/L (ref 3.5–5.2)
SODIUM: 142 mmol/L (ref 134–144)
Total Protein: 6.4 g/dL (ref 6.0–8.5)

## 2014-01-17 LAB — VITAMIN D 25 HYDROXY (VIT D DEFICIENCY, FRACTURES): Vit D, 25-Hydroxy: 31.1 ng/mL (ref 30.0–100.0)

## 2014-01-17 LAB — LIPID PANEL
CHOL/HDL RATIO: 3.5 ratio (ref 0.0–4.4)
Cholesterol, Total: 190 mg/dL (ref 100–199)
HDL: 54 mg/dL (ref >39)
LDL Calculated: 112 mg/dL — ABNORMAL HIGH (ref 0–99)
Triglycerides: 121 mg/dL (ref 0–149)
VLDL Cholesterol Cal: 24 mg/dL (ref 5–40)

## 2014-01-17 MED ORDER — SIMVASTATIN 20 MG PO TABS: 20.0000 mg | ORAL_TABLET | Freq: Every day | ORAL | Status: DC

## 2014-01-18 ENCOUNTER — Telehealth: Payer: Self-pay | Admitting: *Deleted

## 2014-01-18 ENCOUNTER — Other Ambulatory Visit: Payer: Self-pay | Admitting: Family

## 2014-01-18 MED ORDER — PITAVASTATIN CALCIUM 2 MG PO TABS: 2.0000 mg | ORAL_TABLET | Freq: Every day | ORAL | Status: DC

## 2014-01-18 MED ORDER — PITAVASTATIN CALCIUM 2 MG PO TABS: 1.0000 | ORAL_TABLET | Freq: Every day | ORAL | Status: DC

## 2014-01-18 NOTE — Addendum Note (Signed)
Addended by: Evelina Dun A on: 01/18/2014 02:13 PM   Modules accepted: Medications

## 2014-01-18 NOTE — Addendum Note (Signed)
Addended by: Shelbie Ammons on: 01/18/2014 03:16 PM   Modules accepted: Orders

## 2014-01-18 NOTE — Telephone Encounter (Signed)
Aware,samples of livalo ready.

## 2014-01-18 NOTE — Progress Notes (Signed)
Zocor d/c per pt. New RX of Livalo sent to pharmacy. New medication that hopefully will not cause muscle aches. We have samples and coupon cards if medication is too expensive.

## 2014-01-23 ENCOUNTER — Telehealth: Payer: Self-pay | Admitting: Family

## 2014-01-23 NOTE — Telephone Encounter (Signed)
Pt called and discussed cholesterol medications. Pt states both medications that have been prescribed cause her to have severe muscle aches. Pt told to stop medications and encouraged to exercise and low fat diet.

## 2014-02-15 DIAGNOSIS — Z23 Encounter for immunization: Secondary | ICD-10-CM | POA: Diagnosis not present

## 2014-04-20 DIAGNOSIS — M9963 Osseous and subluxation stenosis of intervertebral foramina of lumbar region: Secondary | ICD-10-CM | POA: Diagnosis not present

## 2014-04-20 DIAGNOSIS — M4806 Spinal stenosis, lumbar region: Secondary | ICD-10-CM | POA: Diagnosis not present

## 2014-05-04 DIAGNOSIS — H2513 Age-related nuclear cataract, bilateral: Secondary | ICD-10-CM | POA: Diagnosis not present

## 2014-05-04 DIAGNOSIS — H538 Other visual disturbances: Secondary | ICD-10-CM | POA: Diagnosis not present

## 2014-05-16 DIAGNOSIS — M1611 Unilateral primary osteoarthritis, right hip: Secondary | ICD-10-CM | POA: Diagnosis not present

## 2014-05-19 DIAGNOSIS — M5116 Intervertebral disc disorders with radiculopathy, lumbar region: Secondary | ICD-10-CM | POA: Diagnosis not present

## 2014-05-19 DIAGNOSIS — M4806 Spinal stenosis, lumbar region: Secondary | ICD-10-CM | POA: Diagnosis not present

## 2014-05-19 DIAGNOSIS — M5126 Other intervertebral disc displacement, lumbar region: Secondary | ICD-10-CM | POA: Diagnosis not present

## 2014-05-23 DIAGNOSIS — M4806 Spinal stenosis, lumbar region: Secondary | ICD-10-CM | POA: Diagnosis not present

## 2014-05-23 DIAGNOSIS — M25551 Pain in right hip: Secondary | ICD-10-CM | POA: Diagnosis not present

## 2014-06-15 DIAGNOSIS — M25551 Pain in right hip: Secondary | ICD-10-CM | POA: Diagnosis not present

## 2014-07-20 ENCOUNTER — Ambulatory Visit (INDEPENDENT_AMBULATORY_CARE_PROVIDER_SITE_OTHER): Payer: Medicare Other | Admitting: Family

## 2014-07-20 ENCOUNTER — Encounter: Payer: Self-pay | Admitting: Family

## 2014-07-20 VITALS — BP 138/78 | Temp 96.9°F | Ht 62.0 in | Wt 161.0 lb

## 2014-07-20 DIAGNOSIS — E559 Vitamin D deficiency, unspecified: Secondary | ICD-10-CM

## 2014-07-20 DIAGNOSIS — E785 Hyperlipidemia, unspecified: Secondary | ICD-10-CM

## 2014-07-20 DIAGNOSIS — I10 Essential (primary) hypertension: Secondary | ICD-10-CM

## 2014-07-20 DIAGNOSIS — M899 Disorder of bone, unspecified: Secondary | ICD-10-CM | POA: Diagnosis not present

## 2014-07-20 DIAGNOSIS — M8580 Other specified disorders of bone density and structure, unspecified site: Secondary | ICD-10-CM | POA: Diagnosis not present

## 2014-07-20 DIAGNOSIS — Z13820 Encounter for screening for osteoporosis: Secondary | ICD-10-CM

## 2014-07-20 NOTE — Progress Notes (Signed)
   Subjective:    Patient ID: Andrea Moreno, female    DOB: [birth year], 74 y.o.   MRN: [id number]  Hyperlipidemia This is a chronic problem. The current episode started more than 1 year ago. The problem is uncontrolled. Recent lipid tests were reviewed and are high. She has no history of diabetes or hypothyroidism. Pertinent negatives include no leg pain, myalgias or shortness of breath. Current antihyperlipidemic treatment includes statins and ezetimibe. The current treatment provides moderate improvement of lipids. Risk factors for coronary artery disease include dyslipidemia, hypertension and post-menopausal.  Hypertension This is a chronic problem. The current episode started more than 1 year ago. The problem has been resolved since onset. The problem is controlled. Pertinent negatives include no headaches, palpitations, peripheral edema or shortness of breath. Risk factors for coronary artery disease include dyslipidemia, family history, obesity and post-menopausal state. Past treatments include angiotensin blockers and diuretics. The current treatment provides significant improvement. Hypertensive end-organ damage includes CVA (TIA). There is no history of kidney disease, CAD/MI, heart failure or a thyroid problem. There is no history of sleep apnea.      Review of Systems  Constitutional: Negative.   HENT: Negative.   Eyes: Negative.   Respiratory: Negative.  Negative for shortness of breath.   Cardiovascular: Negative.  Negative for palpitations.  Gastrointestinal: Negative.   Endocrine: Negative.   Genitourinary: Negative.   Musculoskeletal: Negative.  Negative for myalgias.  Neurological: Negative.  Negative for headaches.  Hematological: Negative.   Psychiatric/Behavioral: Negative.   All other systems reviewed and are negative.      Objective:   Physical Exam  Constitutional: She is oriented to person, place, and time. She appears well-developed and well-nourished. No  distress.  HENT:  Head: Normocephalic and atraumatic.  Right Ear: External ear normal.  Mouth/Throat: Oropharynx is clear and moist.  Eyes: Pupils are equal, round, and reactive to light.  Neck: Normal range of motion. Neck supple. No thyromegaly present.  Cardiovascular: Normal rate, regular rhythm, normal heart sounds and intact distal pulses.   No murmur heard. Pulmonary/Chest: Effort normal and breath sounds normal. No respiratory distress. She has no wheezes.  Abdominal: Soft. Bowel sounds are normal. She exhibits no distension. There is no tenderness.  Musculoskeletal: Normal range of motion. She exhibits no edema or tenderness.  Neurological: She is alert and oriented to person, place, and time. She has normal reflexes. No cranial nerve deficit.  Skin: Skin is warm and dry.  Psychiatric: She has a normal mood and affect. Her behavior is normal. Judgment and thought content normal.  Vitals reviewed.   BP 138/78 mmHg  Temp(Src) 96.9 F (36.1 C) (Oral)  Ht _0  (1.575 m)  Wt 161 lb (73.029 kg)  BMI 29.44 kg/m2       Assessment & Plan:  1. Essential hypertension - CMP14+EGFR  2. Osteopenia - CMP14+EGFR  3. Vitamin D deficiency - CMP14+EGFR - Vit D  25 hydroxy (rtn osteoporosis monitoring)  4. Hyperlipidemia - CMP14+EGFR - Lipid panel  5. Osteoporosis screening - DG Bone Density; Future   Continue all meds Labs pending Health Maintenance reviewed Diet and exercise encouraged RTO 6 months   Evelina Dun, FNP

## 2014-07-20 NOTE — Patient Instructions (Signed)

## 2014-07-21 LAB — CMP14+EGFR
ALBUMIN: 4.2 g/dL (ref 3.5–4.8)
ALT: 24 IU/L (ref 0–32)
AST: 15 IU/L (ref 0–40)
Albumin/Globulin Ratio: 1.8 (ref 1.1–2.5)
Alkaline Phosphatase: 46 IU/L (ref 39–117)
BILIRUBIN TOTAL: 0.5 mg/dL (ref 0.0–1.2)
BUN/Creatinine Ratio: 25 (ref 11–26)
BUN: 18 mg/dL (ref 8–27)
CHLORIDE: 100 mmol/L (ref 97–108)
CO2: 25 mmol/L (ref 18–29)
CREATININE: 0.72 mg/dL (ref 0.57–1.00)
Calcium: 9.3 mg/dL (ref 8.7–10.3)
GFR calc Af Amer: 96 mL/min/{1.73_m2} (ref >59)
GFR calc non Af Amer: 83 mL/min/{1.73_m2} (ref >59)
GLOBULIN, TOTAL: 2.4 g/dL (ref 1.5–4.5)
Glucose: 101 mg/dL — ABNORMAL HIGH (ref 65–99)
Potassium: 3.6 mmol/L (ref 3.5–5.2)
Sodium: 139 mmol/L (ref 134–144)
Total Protein: 6.6 g/dL (ref 6.0–8.5)

## 2014-07-21 LAB — LIPID PANEL
Chol/HDL Ratio: 3.3 ratio units (ref 0.0–4.4)
Cholesterol, Total: 185 mg/dL (ref 100–199)
HDL: 56 mg/dL (ref >39)
LDL CALC: 107 mg/dL — AB (ref 0–99)
Triglycerides: 112 mg/dL (ref 0–149)
VLDL CHOLESTEROL CAL: 22 mg/dL (ref 5–40)

## 2014-07-21 LAB — VITAMIN D 25 HYDROXY (VIT D DEFICIENCY, FRACTURES): Vit D, 25-Hydroxy: 37.4 ng/mL (ref 30.0–100.0)

## 2014-08-07 ENCOUNTER — Telehealth: Payer: Self-pay | Admitting: Family

## 2014-08-07 MED ORDER — RALOXIFENE HCL 60 MG PO TABS: 60.0000 mg | ORAL_TABLET | Freq: Every day | ORAL | Status: DC

## 2014-08-07 NOTE — Telephone Encounter (Signed)
Prescription sent to pharmacy.

## 2014-09-06 ENCOUNTER — Other Ambulatory Visit: Payer: Self-pay | Admitting: Family

## 2014-09-06 DIAGNOSIS — M810 Age-related osteoporosis without current pathological fracture: Secondary | ICD-10-CM

## 2014-09-13 DIAGNOSIS — M7061 Trochanteric bursitis, right hip: Secondary | ICD-10-CM | POA: Diagnosis not present

## 2014-09-20 ENCOUNTER — Ambulatory Visit (INDEPENDENT_AMBULATORY_CARE_PROVIDER_SITE_OTHER): Payer: Medicare Other

## 2014-09-20 ENCOUNTER — Encounter: Payer: Self-pay | Admitting: Pharmacist

## 2014-09-20 ENCOUNTER — Ambulatory Visit (INDEPENDENT_AMBULATORY_CARE_PROVIDER_SITE_OTHER): Payer: Medicare Other | Admitting: Pharmacist

## 2014-09-20 VITALS — BP 125/62 | HR 60 | Ht 62.5 in | Wt 158.0 lb

## 2014-09-20 DIAGNOSIS — M810 Age-related osteoporosis without current pathological fracture: Secondary | ICD-10-CM

## 2014-09-20 DIAGNOSIS — Z0000 Encounter for general adult medical examination without abnormal findings: Secondary | ICD-10-CM

## 2014-09-20 DIAGNOSIS — M8580 Other specified disorders of bone density and structure, unspecified site: Secondary | ICD-10-CM

## 2014-09-20 NOTE — Patient Instructions (Signed)
Preventive Care for Adults A healthy lifestyle and preventive care can promote health and wellness. Preventive health guidelines for women include the following key practices.  A routine yearly physical is a good way to check with your health care provider about your health and preventive screening. It is a chance to share any concerns and updates on your health and to receive a thorough exam.  Visit your dentist for a routine exam and preventive care every 6 months. Brush your teeth twice a day and floss once a day. Good oral hygiene prevents tooth decay and gum disease.  The frequency of eye exams is based on your age, health, family medical history, use of contact lenses, and other factors. Follow your health care provider's recommendations for frequency of eye exams.  Eat a healthy diet. Foods like vegetables, fruits, whole grains, low-fat dairy products, and lean protein foods contain the nutrients you need without too many calories. Decrease your intake of foods high in solid fats, added sugars, and salt. Eat the right amount of calories for you.Get information about a proper diet from your health care provider, if necessary.  Regular physical exercise is one of the most important things you can do for your health. Most adults should get at least 150 minutes of moderate-intensity exercise (any activity that increases your heart rate and causes you to sweat) each week. In addition, most adults need muscle-strengthening exercises on 2 or more days a week.  Maintain a healthy weight. The body mass index (BMI) is a screening tool to identify possible weight problems. It provides an estimate of body fat based on height and weight. Your health care provider can find your BMI and can help you achieve or maintain a healthy weight.For adults 20 years and older:  A BMI below 18.5 is considered underweight.  A BMI of 18.5 to 24.9 is normal.  A BMI of 25 to 29.9 is considered overweight.  A BMI of  30 and above is considered obese.  Maintain normal blood lipids and cholesterol levels by exercising and minimizing your intake of saturated fat. Eat a balanced diet with plenty of fruit and vegetables. Blood tests for lipids and cholesterol should begin at age 76 and be repeated every 5 years. If your lipid or cholesterol levels are high, you are over 50, or you are at high risk for heart disease, you may need your cholesterol levels checked more frequently.Ongoing high lipid and cholesterol levels should be treated with medicines if diet and exercise are not working.  If you smoke, find out from your health care provider how to quit. If you do not use tobacco, do not start.  Lung cancer screening is recommended for adults aged 22-80 years who are at high risk for developing lung cancer because of a history of smoking. A yearly low-dose CT scan of the lungs is recommended for people who have at least a 30-pack-year history of smoking and are a current smoker or have quit within the past 15 years. A pack year of smoking is smoking an average of 1 pack of cigarettes a day for 1 year (for example: 1 pack a day for 30 years or 2 packs a day for 15 years). Yearly screening should continue until the smoker has stopped smoking for at least 15 years. Yearly screening should be stopped for people who develop a health problem that would prevent them from having lung cancer treatment.  If you are pregnant, do not drink alcohol. If you are breastfeeding,  be very cautious about drinking alcohol. If you are not pregnant and choose to drink alcohol, do not have more than 1 drink per day. One drink is considered to be 12 ounces (355 mL) of beer, 5 ounces (148 mL) of wine, or 1.5 ounces (44 mL) of liquor.  Avoid use of street drugs. Do not share needles with anyone. Ask for help if you need support or instructions about stopping the use of drugs.  High blood pressure causes heart disease and increases the risk of  stroke. Your blood pressure should be checked at least every 1 to 2 years. Ongoing high blood pressure should be treated with medicines if weight loss and exercise do not work.  If you are 75-52 years old, ask your health care provider if you should take aspirin to prevent strokes.  Diabetes screening involves taking a blood sample to check your fasting blood sugar level. This should be done once every 3 years, after age 15, if you are within normal weight and without risk factors for diabetes. Testing should be considered at a younger age or be carried out more frequently if you are overweight and have at least 1 risk factor for diabetes.  Breast cancer screening is essential preventive care for women. You should practice "breast self-awareness." This means understanding the normal appearance and feel of your breasts and may include breast self-examination. Any changes detected, no matter how small, should be reported to a health care provider. Women in their 58s and 30s should have a clinical breast exam (CBE) by a health care provider as part of a regular health exam every 1 to 3 years. After age 16, women should have a CBE every year. Starting at age 53, women should consider having a mammogram (breast X-ray test) every year. Women who have a family history of breast cancer should talk to their health care provider about genetic screening. Women at a high risk of breast cancer should talk to their health care providers about having an MRI and a mammogram every year.  Breast cancer gene (BRCA)-related cancer risk assessment is recommended for women who have family members with BRCA-related cancers. BRCA-related cancers include breast, ovarian, tubal, and peritoneal cancers. Having family members with these cancers may be associated with an increased risk for harmful changes (mutations) in the breast cancer genes BRCA1 and BRCA2. Results of the assessment will determine the need for genetic counseling and  BRCA1 and BRCA2 testing.  Routine pelvic exams to screen for cancer are no longer recommended for nonpregnant women who are considered low risk for cancer of the pelvic organs (ovaries, uterus, and vagina) and who do not have symptoms. Ask your health care provider if a screening pelvic exam is right for you.  If you have had past treatment for cervical cancer or a condition that could lead to cancer, you need Pap tests and screening for cancer for at least 20 years after your treatment. If Pap tests have been discontinued, your risk factors (such as having a new sexual partner) need to be reassessed to determine if screening should be resumed. Some women have medical problems that increase the chance of getting cervical cancer. In these cases, your health care provider may recommend more frequent screening and Pap tests.  The HPV test is an additional test that may be used for cervical cancer screening. The HPV test looks for the virus that can cause the cell changes on the cervix. The cells collected during the Pap test can be  tested for HPV. The HPV test could be used to screen women aged 30 years and older, and should be used in women of any age who have unclear Pap test results. After the age of 30, women should have HPV testing at the same frequency as a Pap test.  Colorectal cancer can be detected and often prevented. Most routine colorectal cancer screening begins at the age of 50 years and continues through age 75 years. However, your health care provider may recommend screening at an earlier age if you have risk factors for colon cancer. On a yearly basis, your health care provider may provide home test kits to check for hidden blood in the stool. Use of a small camera at the end of a tube, to directly examine the colon (sigmoidoscopy or colonoscopy), can detect the earliest forms of colorectal cancer. Talk to your health care provider about this at age 50, when routine screening begins. Direct  exam of the colon should be repeated every 5-10 years through age 75 years, unless early forms of pre-cancerous polyps or small growths are found.  People who are at an increased risk for hepatitis B should be screened for this virus. You are considered at high risk for hepatitis B if:  You were born in a country where hepatitis B occurs often. Talk with your health care provider about which countries are considered high risk.  Your parents were born in a high-risk country and you have not received a shot to protect against hepatitis B (hepatitis B vaccine).  You have HIV or AIDS.  You use needles to inject street drugs.  You live with, or have sex with, someone who has hepatitis B.  You get hemodialysis treatment.  You take certain medicines for conditions like cancer, organ transplantation, and autoimmune conditions.  Hepatitis C blood testing is recommended for all people born from 1945 through 1965 and any individual with known risks for hepatitis C.  Practice safe sex. Use condoms and avoid high-risk sexual practices to reduce the spread of sexually transmitted infections (STIs). STIs include gonorrhea, chlamydia, syphilis, trichomonas, herpes, HPV, and human immunodeficiency virus (HIV). Herpes, HIV, and HPV are viral illnesses that have no cure. They can result in disability, cancer, and death.  You should be screened for sexually transmitted illnesses (STIs) including gonorrhea and chlamydia if:  You are sexually active and are younger than 24 years.  You are older than 24 years and your health care provider tells you that you are at risk for this type of infection.  Your sexual activity has changed since you were last screened and you are at an increased risk for chlamydia or gonorrhea. Ask your health care provider if you are at risk.  If you are at risk of being infected with HIV, it is recommended that you take a prescription medicine daily to prevent HIV infection. This is  called preexposure prophylaxis (PrEP). You are considered at risk if:  You are a heterosexual woman, are sexually active, and are at increased risk for HIV infection.  You take drugs by injection.  You are sexually active with a partner who has HIV.  Talk with your health care provider about whether you are at high risk of being infected with HIV. If you choose to begin PrEP, you should first be tested for HIV. You should then be tested every 3 months for as long as you are taking PrEP.  Osteoporosis is a disease in which the bones lose minerals and strength   with aging. This can result in serious bone fractures or breaks. The risk of osteoporosis can be identified using a bone density scan. Women ages 64 years and over and women at risk for fractures or osteoporosis should discuss screening with their health care providers. Ask your health care provider whether you should take a calcium supplement or vitamin D to reduce the rate of osteoporosis.  Menopause can be associated with physical symptoms and risks. Hormone replacement therapy is available to decrease symptoms and risks. You should talk to your health care provider about whether hormone replacement therapy is right for you.  Use sunscreen. Apply sunscreen liberally and repeatedly throughout the day. You should seek shade when your shadow is shorter than you. Protect yourself by wearing long sleeves, pants, a wide-brimmed hat, and sunglasses year round, whenever you are outdoors.  Once a month, do a whole body skin exam, using a mirror to look at the skin on your back. Tell your health care provider of new moles, moles that have irregular borders, moles that are larger than a pencil eraser, or moles that have changed in shape or color.  Stay current with required vaccines (immunizations).  Influenza vaccine. All adults should be immunized every year.  Tetanus, diphtheria, and acellular pertussis (Td, Tdap) vaccine. Pregnant women should  receive 1 dose of Tdap vaccine during each pregnancy. The dose should be obtained regardless of the length of time since the last dose. Immunization is preferred during the 27th-36th week of gestation. An adult who has not previously received Tdap or who does not know her vaccine status should receive 1 dose of Tdap. This initial dose should be followed by tetanus and diphtheria toxoids (Td) booster doses every 10 years. Adults with an unknown or incomplete history of completing a 3-dose immunization series with Td-containing vaccines should begin or complete a primary immunization series including a Tdap dose. Adults should receive a Td booster every 10 years.  Varicella vaccine. An adult without evidence of immunity to varicella should receive 2 doses or a second dose if she has previously received 1 dose. Pregnant females who do not have evidence of immunity should receive the first dose after pregnancy. This first dose should be obtained before leaving the health care facility. The second dose should be obtained 4-8 weeks after the first dose.  Human papillomavirus (HPV) vaccine. Females aged 13-26 years who have not received the vaccine previously should obtain the 3-dose series. The vaccine is not recommended for use in pregnant females. However, pregnancy testing is not needed before receiving a dose. If a female is found to be pregnant after receiving a dose, no treatment is needed. In that case, the remaining doses should be delayed until after the pregnancy. Immunization is recommended for any person with an immunocompromised condition through the age of 85 years if she did not get any or all doses earlier. During the 3-dose series, the second dose should be obtained 4-8 weeks after the first dose. The third dose should be obtained 24 weeks after the first dose and 16 weeks after the second dose.  Zoster vaccine. One dose is recommended for adults aged 12 years or older unless certain conditions are  present.  Measles, mumps, and rubella (MMR) vaccine. Adults born before 42 generally are considered immune to measles and mumps. Adults born in [birth year] or later should have 1 or more doses of MMR vaccine unless there is a contraindication to the vaccine or there is laboratory evidence of immunity to  each of the three diseases. A routine second dose of MMR vaccine should be obtained at least 28 days after the first dose for students attending postsecondary schools, health care workers, or international travelers. People who received inactivated measles vaccine or an unknown type of measles vaccine during 1963-1967 should receive 2 doses of MMR vaccine. People who received inactivated mumps vaccine or an unknown type of mumps vaccine before 1979 and are at high risk for mumps infection should consider immunization with 2 doses of MMR vaccine. For females of childbearing age, rubella immunity should be determined. If there is no evidence of immunity, females who are not pregnant should be vaccinated. If there is no evidence of immunity, females who are pregnant should delay immunization until after pregnancy. Unvaccinated health care workers born before 1957 who lack laboratory evidence of measles, mumps, or rubella immunity or laboratory confirmation of disease should consider measles and mumps immunization with 2 doses of MMR vaccine or rubella immunization with 1 dose of MMR vaccine.  Pneumococcal 13-valent conjugate (PCV13) vaccine. When indicated, a person who is uncertain of her immunization history and has no record of immunization should receive the PCV13 vaccine. An adult aged 19 years or older who has certain medical conditions and has not been previously immunized should receive 1 dose of PCV13 vaccine. This PCV13 should be followed with a dose of pneumococcal polysaccharide (PPSV23) vaccine. The PPSV23 vaccine dose should be obtained at least 8 weeks after the dose of PCV13 vaccine. An adult aged 19  years or older who has certain medical conditions and previously received 1 or more doses of PPSV23 vaccine should receive 1 dose of PCV13. The PCV13 vaccine dose should be obtained 1 or more years after the last PPSV23 vaccine dose.  Pneumococcal polysaccharide (PPSV23) vaccine. When PCV13 is also indicated, PCV13 should be obtained first. All adults aged 65 years and older should be immunized. An adult younger than age 65 years who has certain medical conditions should be immunized. Any person who resides in a nursing home or long-term care facility should be immunized. An adult smoker should be immunized. People with an immunocompromised condition and certain other conditions should receive both PCV13 and PPSV23 vaccines. People with human immunodeficiency virus (HIV) infection should be immunized as soon as possible after diagnosis. Immunization during chemotherapy or radiation therapy should be avoided. Routine use of PPSV23 vaccine is not recommended for American Indians, [place] Natives, or people younger than 65 years unless there are medical conditions that require PPSV23 vaccine. When indicated, people who have unknown immunization and have no record of immunization should receive PPSV23 vaccine. One-time revaccination 5 years after the first dose of PPSV23 is recommended for people aged 19-64 years who have chronic kidney failure, nephrotic syndrome, asplenia, or immunocompromised conditions. People who received 1-2 doses of PPSV23 before age 65 years should receive another dose of PPSV23 vaccine at age 65 years or later if at least 5 years have passed since the previous dose. Doses of PPSV23 are not needed for people immunized with PPSV23 at or after age 65 years.  Meningococcal vaccine. Adults with asplenia or persistent complement component deficiencies should receive 2 doses of quadrivalent meningococcal conjugate (MenACWY-D) vaccine. The doses should be obtained at least 2 months apart.  Microbiologists working with certain meningococcal bacteria, military recruits, people at risk during an outbreak, and people who travel to or live in countries with a high rate of meningitis should be immunized. A first-year college student up through age   21 years who is living in a residence hall should receive a dose if she did not receive a dose on or after her 16th birthday. Adults who have certain high-risk conditions should receive one or more doses of vaccine.  Hepatitis A vaccine. Adults who wish to be protected from this disease, have certain high-risk conditions, work with hepatitis A-infected animals, work in hepatitis A research labs, or travel to or work in countries with a high rate of hepatitis A should be immunized. Adults who were previously unvaccinated and who anticipate close contact with an international adoptee during the first 60 days after arrival in the [place] from a country with a high rate of hepatitis A should be immunized.  Hepatitis B vaccine. Adults who wish to be protected from this disease, have certain high-risk conditions, may be exposed to blood or other infectious body fluids, are household contacts or sex partners of hepatitis B positive people, are clients or workers in certain care facilities, or travel to or work in countries with a high rate of hepatitis B should be immunized.  Haemophilus influenzae type b (Hib) vaccine. A previously unvaccinated person with asplenia or sickle cell disease or having a scheduled splenectomy should receive 1 dose of Hib vaccine. Regardless of previous immunization, a recipient of a hematopoietic stem cell transplant should receive a 3-dose series 6-12 months after her successful transplant. Hib vaccine is not recommended for adults with HIV infection. Preventive Services / Frequency Ages 64 to 68 years  Blood pressure check.** / Every 1 to 2 years.  Lipid and cholesterol check.** / Every 5 years beginning at age  22.  Clinical breast exam.** / Every 3 years for women in their 88s and 53s.  BRCA-related cancer risk assessment.** / For women who have family members with a BRCA-related cancer (breast, ovarian, tubal, or peritoneal cancers).  Pap test.** / Every 2 years from ages 90 through 51. Every 3 years starting at age 21 through age 56 or 3 with a history of 3 consecutive normal Pap tests.  HPV screening.** / Every 3 years from ages 24 through ages 1 to 46 with a history of 3 consecutive normal Pap tests.  Hepatitis C blood test.** / For any individual with known risks for hepatitis C.  Skin self-exam. / Monthly.  Influenza vaccine. / Every year.  Tetanus, diphtheria, and acellular pertussis (Tdap, Td) vaccine.** / Consult your health care provider. Pregnant women should receive 1 dose of Tdap vaccine during each pregnancy. 1 dose of Td every 10 years.  Varicella vaccine.** / Consult your health care provider. Pregnant females who do not have evidence of immunity should receive the first dose after pregnancy.  HPV vaccine. / 3 doses over 6 months, if 72 and younger. The vaccine is not recommended for use in pregnant females. However, pregnancy testing is not needed before receiving a dose.  Measles, mumps, rubella (MMR) vaccine.** / You need at least 1 dose of MMR if you were born in 1957 or later. You may also need a 2nd dose. For females of childbearing age, rubella immunity should be determined. If there is no evidence of immunity, females who are not pregnant should be vaccinated. If there is no evidence of immunity, females who are pregnant should delay immunization until after pregnancy.  Pneumococcal 13-valent conjugate (PCV13) vaccine.** / Consult your health care provider.  Pneumococcal polysaccharide (PPSV23) vaccine.** / 1 to 2 doses if you smoke cigarettes or if you have certain conditions.  Meningococcal vaccine.** /  1 dose if you are age 60 to 61 years and a [place] living in a residence hall, or have one of several medical conditions, you need to get vaccinated against meningococcal disease. You may also need additional booster doses.  Hepatitis A vaccine.** / Consult your health care provider.  Hepatitis B vaccine.** / Consult your health care provider.  Haemophilus influenzae type b (Hib) vaccine.** / Consult your health care provider. Ages 40 to 66 years  Blood pressure check.** / Every 1 to 2 years.  Lipid and cholesterol check.** / Every 5 years beginning at age 69 years.  Lung cancer screening. / Every year if you are aged 57-80 years and have a 30-pack-year history of smoking and currently smoke or have quit within the past 15 years. Yearly screening is stopped once you have quit smoking for at least 15 years or develop a health problem that would prevent you from having lung cancer treatment.  Clinical breast exam.** / Every year after age 90 years.  BRCA-related cancer risk assessment.** / For women who have family members with a BRCA-related cancer (breast, ovarian, tubal, or peritoneal cancers).  Mammogram.** / Every year beginning at age 74 years and continuing for as long as you are in good health. Consult with your health care provider.  Pap test.** / Every 3 years starting at age 65 years through age 72 or 62 years with a history of 3 consecutive normal Pap tests.  HPV screening.** / Every 3 years from ages 25 years through ages 108 to 62 years with a history of 3 consecutive normal Pap tests.  Fecal occult blood test (FOBT) of stool. / Every year beginning at age 53 years and continuing until age 79 years. You may not need to do this test if you get a colonoscopy every 10 years.  Flexible sigmoidoscopy or colonoscopy.** / Every 5 years for a flexible sigmoidoscopy or every 10 years for a colonoscopy beginning at age 32 years and continuing until age 66 years.  Hepatitis C blood test.** / For all people born from [birth year] through  1965 and any individual with known risks for hepatitis C.  Skin self-exam. / Monthly.  Influenza vaccine. / Every year.  Tetanus, diphtheria, and acellular pertussis (Tdap/Td) vaccine.** / Consult your health care provider. Pregnant women should receive 1 dose of Tdap vaccine during each pregnancy. 1 dose of Td every 10 years.  Varicella vaccine.** / Consult your health care provider. Pregnant females who do not have evidence of immunity should receive the first dose after pregnancy.  Zoster vaccine.** / 1 dose for adults aged 11 years or older.  Measles, mumps, rubella (MMR) vaccine.** / You need at least 1 dose of MMR if you were born in 1957 or later. You may also need a 2nd dose. For females of childbearing age, rubella immunity should be determined. If there is no evidence of immunity, females who are not pregnant should be vaccinated. If there is no evidence of immunity, females who are pregnant should delay immunization until after pregnancy.  Pneumococcal 13-valent conjugate (PCV13) vaccine.** / Consult your health care provider.  Pneumococcal polysaccharide (PPSV23) vaccine.** / 1 to 2 doses if you smoke cigarettes or if you have certain conditions.  Meningococcal vaccine.** / Consult your health care provider.  Hepatitis A vaccine.** / Consult your health care provider.  Hepatitis B vaccine.** / Consult your health care provider.  Haemophilus influenzae type b (Hib) vaccine.** / Consult your health care provider. Ages 58  years and over  Blood pressure check.** / Every 1 to 2 years.  Lipid and cholesterol check.** / Every 5 years beginning at age 22 years.  Lung cancer screening. / Every year if you are aged 73-80 years and have a 30-pack-year history of smoking and currently smoke or have quit within the past 15 years. Yearly screening is stopped once you have quit smoking for at least 15 years or develop a health problem that would prevent you from having lung cancer  treatment.  Clinical breast exam.** / Every year after age 4 years.  BRCA-related cancer risk assessment.** / For women who have family members with a BRCA-related cancer (breast, ovarian, tubal, or peritoneal cancers).  Mammogram.** / Every year beginning at age 40 years and continuing for as long as you are in good health. Consult with your health care provider.  Pap test.** / Every 3 years starting at age 9 years through age 34 or 91 years with 3 consecutive normal Pap tests. Testing can be stopped between 65 and 70 years with 3 consecutive normal Pap tests and no abnormal Pap or HPV tests in the past 10 years.  HPV screening.** / Every 3 years from ages 57 years through ages 64 or 45 years with a history of 3 consecutive normal Pap tests. Testing can be stopped between 65 and 70 years with 3 consecutive normal Pap tests and no abnormal Pap or HPV tests in the past 10 years.  Fecal occult blood test (FOBT) of stool. / Every year beginning at age 15 years and continuing until age 17 years. You may not need to do this test if you get a colonoscopy every 10 years.  Flexible sigmoidoscopy or colonoscopy.** / Every 5 years for a flexible sigmoidoscopy or every 10 years for a colonoscopy beginning at age 86 years and continuing until age 71 years.  Hepatitis C blood test.** / For all people born from [birth year] through 1965 and any individual with known risks for hepatitis C.  Osteoporosis screening.** / A one-time screening for women ages 83 years and over and women at risk for fractures or osteoporosis.  Skin self-exam. / Monthly.  Influenza vaccine. / Every year.  Tetanus, diphtheria, and acellular pertussis (Tdap/Td) vaccine.** / 1 dose of Td every 10 years.  Varicella vaccine.** / Consult your health care provider.  Zoster vaccine.** / 1 dose for adults aged 61 years or older.  Pneumococcal 13-valent conjugate (PCV13) vaccine.** / Consult your health care provider.  Pneumococcal  polysaccharide (PPSV23) vaccine.** / 1 dose for all adults aged 28 years and older.  Meningococcal vaccine.** / Consult your health care provider.  Hepatitis A vaccine.** / Consult your health care provider.  Hepatitis B vaccine.** / Consult your health care provider.  Haemophilus influenzae type b (Hib) vaccine.** / Consult your health care provider. ** Family history and personal history of risk and conditions may change your health care provider's recommendations. Document Released: 07/15/2001 Document Revised: 10/03/2013 Document Reviewed: 10/14/2010 [facility] Patient Information 2015 [place], [place]. This information is not intended to replace advice given to you by your health care provider. Make sure you discuss any questions you have with your health care provider.

## 2014-09-20 NOTE — Progress Notes (Signed)
Patient ID: Andrea Moreno, female   DOB: 06/19/1940, 74 y.o.   MRN: [id number]   Subjective:   Andrea Moreno is a 74 y.o. female who presents for an Initial Medicare Annual Wellness Visit.  Current Medications (verified) Outpatient Encounter Prescriptions as of 09/20/2014  Medication Sig  . acetaminophen (TYLENOL) 650 MG CR tablet Take 650 mg by mouth every 8 (eight) hours.  . aspirin EC 325 MG EC tablet Take 1 tablet (325 mg total) by mouth daily.  . calcium carbonate (TUMS - DOSED IN MG ELEMENTAL CALCIUM) 500 MG chewable tablet Chew 1 tablet by mouth daily.  . cetirizine (ZYRTEC) 10 MG tablet Take 10 mg by mouth daily.  . cholecalciferol (VITAMIN D) 400 UNITS TABS Take 400-800 Units by mouth daily. Tues, Thurs, Sat-2 tabs daily, all other days 1 tab  . cyclobenzaprine (FLEXERIL) 5 MG tablet Take 1 tablet (5 mg total) by mouth 2 (two) times daily as needed. (Patient taking differently: Take 5 mg by mouth daily. )  . ezetimibe (ZETIA) 10 MG tablet TAKE 1 TABLET DAILY  . fish oil-omega-3 fatty acids 1000 MG capsule Take 1 g by mouth 2 (two) times daily.   . losartan-hydrochlorothiazide (HYZAAR) 100-25 MG per tablet TAKE 1 TABLET DAILY  . Milk Thistle 1000 MG CAPS Take 1,000 mg by mouth at bedtime.   . Multiple Vitamin (MULTIVITAMIN) capsule Take 1 capsule by mouth daily.  . Potassium 99 MG TABS Take 99 mg by mouth daily.  . raloxifene (EVISTA) 60 MG tablet Take 1 tablet (60 mg total) by mouth daily.  . VOLTAREN 1 % GEL Apply 2 g topically daily as needed.   . alendronate (FOSAMAX) 70 MG tablet Take 1 tablet (70 mg total) by mouth every 7 (seven) days. Sundays only (Patient not taking: Reported on 09/20/2014)  . OVER THE COUNTER MEDICATION Take 1 capsule by mouth 3 (three) times daily. "Replenex"  . TURMERIC PO Take 450 mg by mouth 2 (two) times daily.     Allergies (verified) Celebrex; Demerol; and Statins   History: Past Medical History  Diagnosis Date  . Hypertension   .  Allergy   . Osteopenia   . Hyperlipidemia   . Complication of anesthesia     "1950's had appendix out; kicked the RN; no problems since" (10/14/2012)  . Heart murmur   . Pneumonia     "couple of times; last time in the mid 1990's" (10/14/2012)  . Arthritis     "back, right shoulder, hips" (10/14/2012)  . TIA (transient ischemic attack) 10/14/2012  . Cataract    Past Surgical History  Procedure Laterality Date  . Appendectomy  1950's  . Tonsillectomy  1940's  . Back surgery    . Dilation and curettage of uterus  ~ 1975  . Tubal ligation  1960's  . Lumbar disc surgery  1975    "had a disc removed" (10/14/2012)   Family History  Problem Relation Age of Onset  . Heart disease Mother   . Diabetes Mother   . Arthritis Mother   . Vascular Disease Mother   . Diabetes Sister   . Hypertension Sister   . Early death Brother   . Vascular Disease Brother   . Arthritis Father   . Diabetes Sister   . Hypertension Sister    Social History   Occupational History  . Not on file.   Social History Main Topics  . Smoking status: Former Smoker -- 0.30 packs/day for 5 years      Types: Cigarettes    Quit date: 08/31/1985  . Smokeless tobacco: Never Used  . Alcohol Use: 0.6 oz/week    1 Cans of beer per week     Comment: 10/14/2012 "~ 6 oz wine/day"  . Drug Use: No  . Sexual Activity: No    Do you feel safe at home?  Yes  Dietary issues and exercise activities: Current Exercise Habits:: Home exercise routine, Type of exercise: walking;strength training/weights, Time (Minutes): 30, Frequency (Times/Week): > 6, Weekly Exercise (Minutes/Week): 0, Intensity: Moderate  Current Dietary habits:  Eats lots of non starchy vegetables   Objective:    Today's Vitals   09/20/14 1428  BP: 125/62  Pulse: 60  Height: 5' 2.5" (1.588 m)  Weight: 158 lb (71.668 kg)   Body mass index is 28.42 kg/(m^2).  Activities of Daily Living In your present state of health, do you have any difficulty  performing the following activities: 09/20/2014  Hearing? Y  Vision? N  Difficulty concentrating or making decisions? N  Walking or climbing stairs? N  Dressing or bathing? N  Doing errands, shopping? N  Preparing Food and eating ? N  Using the Toilet? N  In the past six months, have you accidently leaked urine? N  Do you have problems with loss of bowel control? N  Managing your Medications? N  Managing your Finances? N  Housekeeping or managing your Housekeeping? N    Cardiac Risk Factors include: advanced age (>55men, >65 women);family history of premature cardiovascular disease  Depression Screen PHQ 2/9 Scores 09/20/2014 07/20/2014 09/22/2013  PHQ - 2 Score 0 0 0    Fall Risk Fall Risk  09/20/2014 09/22/2013  Falls in the past year? No No    Cognitive Function: MMSE - Mini Mental State Exam 09/20/2014  Orientation to time 5  Orientation to Place 5  Registration 3  Attention/ Calculation 5  Recall 2  Language- name 2 objects 2  Language- repeat 1  Language- follow 3 step command 3  Language- read & follow direction 1  Write a sentence 1  Copy design 1  Total score 29    Immunizations and Health Maintenance Immunization History  Administered Date(s) Administered  . Influenza, High Dose Seasonal PF 03/09/2013, 02/15/2014  . Pneumococcal Polysaccharide-23 10/15/2012  . Tdap 08/10/2013  . Zoster 06/02/2004   Health Maintenance Due  Topic Date Due  . PNA vac Low Risk Adult (2 of 2 - PCV13) 10/15/2013    Patient Care Team: Christy A Hawks, FNP as PCP - General (Nurse Practitioner) Anna Voytek, MD as Consulting Physician (Orthopedic Surgery) Carroll F Haines, MD as Consulting Physician (Ophthalmology)  Indicate any recent Medical Services you may have received from other than [facility] providers in the past year (date may be approximate).    Assessment:    Annual Wellness Visit  Osteopenia - increases in BMD seen at all sites compared to previous DEXA   Screening  Tests Health Maintenance  Topic Date Due  . PNA vac Low Risk Adult (2 of 2 - PCV13) 10/15/2013  . DEXA SCAN  01/18/2015 (Originally 12/11/2005)  . MAMMOGRAM  01/17/2016 (Originally 12/12/1990)  . INFLUENZA VACCINE  01/01/2015  . COLONOSCOPY  06/03/2015  . TETANUS/TDAP  08/11/2023  . ZOSTAVAX  Completed        Plan:   During the course of the visit Riyanshi was educated and counseled about the following appropriate screening and preventive services:   Vaccines to include Pneumoccal, Influenza, Hepatitis B, Td, Zostavax-patient is   UTD on all vaccines except Prevnar 13. She wants to hold off on Prevnar 13 until she talks to PCP.   Colorectal cancer screening-her last colonoscopy was in 2007 and is UTD. Patient has FOBT kit at home and plans to bring it in soon.  Cardiovascular disease screening-patient's BP is controlled and within goal at 125/62. Patient is on aspirin 325 for secondary prevention of TIA. Patient is on Zetia to help lower her TG. Her lipids are a little high but improving. Patient states she is eating a lot more vegetables and cutting back on cheese to help lower her cholesterol. She does gardening and walking for regular exercise.   Diabetes screening-patient had fasting blood sugar of 101 in 07/2014 which is a little elevated but should be improved with current healthy diet. Most recent A1c was 5.3% which is excellent.   Bone Denisty / Osteoporosis Screening-discussed DEXA results with patient. BMD has improved in spine and both left and right femur. Osteopenia is improving with Evista therapy. She also takes calcium+vit d supplements to help build bones. Discussed fall prevention with patient. Patient is aware and is careful not to trip when walking. Patient does not live alone and is not at risk for falling down with no one to help her.   Mammogram-patient has not gotten a mammogram in a while and declines to get another one. Patient has not had any abnormal findings in  past.  PAP-patient declines   Glaucoma screening-patient sees Dr. Haines for her eyes.  Will request last eye exam  Nutrition counseling-counseled patient on calcium rich foods and to get at least 1200 mg of calcium daily.   Advanced Directives-patient has advanced directives at home and will bring in a copy soon.  Continue with current exercise    Patient Instructions (the written plan) were given to the patient.   Eckard, Tammy, PHARMD   09/20/2014          

## 2014-10-03 DIAGNOSIS — M7061 Trochanteric bursitis, right hip: Secondary | ICD-10-CM | POA: Diagnosis not present

## 2014-11-10 ENCOUNTER — Encounter: Payer: Self-pay | Admitting: *Deleted

## 2014-11-24 DIAGNOSIS — L03211 Cellulitis of face: Secondary | ICD-10-CM | POA: Diagnosis not present

## 2014-11-24 DIAGNOSIS — R599 Enlarged lymph nodes, unspecified: Secondary | ICD-10-CM | POA: Diagnosis not present

## 2014-11-27 ENCOUNTER — Other Ambulatory Visit: Payer: Self-pay

## 2015-01-16 ENCOUNTER — Encounter: Payer: Self-pay | Admitting: Family

## 2015-01-16 ENCOUNTER — Ambulatory Visit (INDEPENDENT_AMBULATORY_CARE_PROVIDER_SITE_OTHER): Payer: Medicare Other | Admitting: Family

## 2015-01-16 VITALS — BP 132/72 | Temp 97.7°F | Ht 62.5 in | Wt 155.6 lb

## 2015-01-16 DIAGNOSIS — E559 Vitamin D deficiency, unspecified: Secondary | ICD-10-CM

## 2015-01-16 DIAGNOSIS — Z23 Encounter for immunization: Secondary | ICD-10-CM | POA: Diagnosis not present

## 2015-01-16 DIAGNOSIS — G4700 Insomnia, unspecified: Secondary | ICD-10-CM

## 2015-01-16 DIAGNOSIS — E785 Hyperlipidemia, unspecified: Secondary | ICD-10-CM

## 2015-01-16 DIAGNOSIS — R5383 Other fatigue: Secondary | ICD-10-CM

## 2015-01-16 DIAGNOSIS — I10 Essential (primary) hypertension: Secondary | ICD-10-CM

## 2015-01-16 DIAGNOSIS — L659 Nonscarring hair loss, unspecified: Secondary | ICD-10-CM | POA: Diagnosis not present

## 2015-01-16 DIAGNOSIS — M8580 Other specified disorders of bone density and structure, unspecified site: Secondary | ICD-10-CM

## 2015-01-16 DIAGNOSIS — F411 Generalized anxiety disorder: Secondary | ICD-10-CM

## 2015-01-16 MED ORDER — LOSARTAN POTASSIUM-HCTZ 100-25 MG PO TABS: ORAL_TABLET | ORAL | Status: DC

## 2015-01-16 MED ORDER — RALOXIFENE HCL 60 MG PO TABS: 60.0000 mg | ORAL_TABLET | Freq: Every day | ORAL | Status: DC

## 2015-01-16 MED ORDER — EZETIMIBE 10 MG PO TABS: ORAL_TABLET | ORAL | Status: DC

## 2015-01-16 MED ORDER — ALPRAZOLAM 0.25 MG PO TABS: 0.2500 mg | ORAL_TABLET | Freq: Every evening | ORAL | Status: DC | PRN

## 2015-01-16 NOTE — Patient Instructions (Addendum)
Health Maintenance Adopting a healthy lifestyle and getting preventive care can go a long way to promote health and wellness. Talk with your health care provider about what schedule of regular examinations is right for you. This is a good chance for you to check in with your provider about disease prevention and staying healthy. In between checkups, there are plenty of things you can do on your own. Experts have done a lot of research about which lifestyle changes and preventive measures are most likely to keep you healthy. Ask your health care provider for more information. WEIGHT AND DIET  Eat a healthy diet  Be sure to include plenty of vegetables, fruits, low-fat dairy products, and lean protein.  Do not eat a lot of foods high in solid fats, added sugars, or salt.  Get regular exercise. This is one of the most important things you can do for your health.  Most adults should exercise for at least 150 minutes each week. The exercise should increase your heart rate and make you sweat (moderate-intensity exercise).  Most adults should also do strengthening exercises at least twice a week. This is in addition to the moderate-intensity exercise.  Maintain a healthy weight  Body mass index (BMI) is a measurement that can be used to identify possible weight problems. It estimates body fat based on height and weight. Your health care provider can help determine your BMI and help you achieve or maintain a healthy weight.  For females 25 years of age and older:   A BMI below 18.5 is considered underweight.  A BMI of 18.5 to 24.9 is normal.  A BMI of 25 to 29.9 is considered overweight.  A BMI of 30 and above is considered obese.  Watch levels of cholesterol and blood lipids  You should start having your blood tested for lipids and cholesterol at 74 years of age, then have this test every 5 years.  You may need to have your cholesterol levels checked more often if:  Your lipid or  cholesterol levels are high.  You are older than 74 years of age.  You are at high risk for heart disease.  CANCER SCREENING   Lung Cancer  Lung cancer screening is recommended for adults 97-92 years old who are at high risk for lung cancer because of a history of smoking.  A yearly low-dose CT scan of the lungs is recommended for people who:  Currently smoke.  Have quit within the past 15 years.  Have at least a 30-pack-year history of smoking. A pack year is smoking an average of one pack of cigarettes a day for 1 year.  Yearly screening should continue until it has been 15 years since you quit.  Yearly screening should stop if you develop a health problem that would prevent you from having lung cancer treatment.  Breast Cancer  Practice breast self-awareness. This means understanding how your breasts normally appear and feel.  It also means doing regular breast self-exams. Let your health care provider know about any changes, no matter how small.  If you are in your 20s or 30s, you should have a clinical breast exam (CBE) by a health care provider every 1-3 years as part of a regular health exam.  If you are 76 or older, have a CBE every year. Also consider having a breast X-ray (mammogram) every year.  If you have a family history of breast cancer, talk to your health care provider about genetic screening.  If you are  at high risk for breast cancer, talk to your health care provider about having an MRI and a mammogram every year.  Breast cancer gene (BRCA) assessment is recommended for women who have family members with BRCA-related cancers. BRCA-related cancers include:  Breast.  Ovarian.  Tubal.  Peritoneal cancers.  Results of the assessment will determine the need for genetic counseling and BRCA1 and BRCA2 testing. Cervical Cancer Routine pelvic examinations to screen for cervical cancer are no longer recommended for nonpregnant women who are considered low  risk for cancer of the pelvic organs (ovaries, uterus, and vagina) and who do not have symptoms. A pelvic examination may be necessary if you have symptoms including those associated with pelvic infections. Ask your health care provider if a screening pelvic exam is right for you.   The Pap test is the screening test for cervical cancer for women who are considered at risk.  If you had a hysterectomy for a problem that was not cancer or a condition that could lead to cancer, then you no longer need Pap tests.  If you are older than 65 years, and you have had normal Pap tests for the past 10 years, you no longer need to have Pap tests.  If you have had past treatment for cervical cancer or a condition that could lead to cancer, you need Pap tests and screening for cancer for at least 20 years after your treatment.  If you no longer get a Pap test, assess your risk factors if they change (such as having a new sexual partner). This can affect whether you should start being screened again.  Some women have medical problems that increase their chance of getting cervical cancer. If this is the case for you, your health care provider may recommend more frequent screening and Pap tests.  The human papillomavirus (HPV) test is another test that may be used for cervical cancer screening. The HPV test looks for the virus that can cause cell changes in the cervix. The cells collected during the Pap test can be tested for HPV.  The HPV test can be used to screen women 30 years of age and older. Getting tested for HPV can extend the interval between normal Pap tests from three to five years.  An HPV test also should be used to screen women of any age who have unclear Pap test results.  After 74 years of age, women should have HPV testing as often as Pap tests.  Colorectal Cancer  This type of cancer can be detected and often prevented.  Routine colorectal cancer screening usually begins at 74 years of  age and continues through 75 years of age.  Your health care provider may recommend screening at an earlier age if you have risk factors for colon cancer.  Your health care provider may also recommend using home test kits to check for hidden blood in the stool.  A small camera at the end of a tube can be used to examine your colon directly (sigmoidoscopy or colonoscopy). This is done to check for the earliest forms of colorectal cancer.  Routine screening usually begins at age 50.  Direct examination of the colon should be repeated every 5-10 years through 75 years of age. However, you may need to be screened more often if early forms of precancerous polyps or small growths are found. Skin Cancer  Check your skin from head to toe regularly.  Tell your health care provider about any new moles or changes in   moles, especially if there is a change in a mole's shape or color.  Also tell your health care provider if you have a mole that is larger than the size of a pencil eraser.  Always use sunscreen. Apply sunscreen liberally and repeatedly throughout the day.  Protect yourself by wearing long sleeves, pants, a wide-brimmed hat, and sunglasses whenever you are outside. HEART DISEASE, DIABETES, AND HIGH BLOOD PRESSURE   Have your blood pressure checked at least every 1-2 years. High blood pressure causes heart disease and increases the risk of stroke.  If you are between 75 years and 42 years old, ask your health care provider if you should take aspirin to prevent strokes.  Have regular diabetes screenings. This involves taking a blood sample to check your fasting blood sugar level.  If you are at a normal weight and have a low risk for diabetes, have this test once every three years after 74 years of age.  If you are overweight and have a high risk for diabetes, consider being tested at a younger age or more often. PREVENTING INFECTION  Hepatitis B  If you have a higher risk for  hepatitis B, you should be screened for this virus. You are considered at high risk for hepatitis B if:  You were born in a country where hepatitis B is common. Ask your health care provider which countries are considered high risk.  Your parents were born in a high-risk country, and you have not been immunized against hepatitis B (hepatitis B vaccine).  You have HIV or AIDS.  You use needles to inject street drugs.  You live with someone who has hepatitis B.  You have had sex with someone who has hepatitis B.  You get hemodialysis treatment.  You take certain medicines for conditions, including cancer, organ transplantation, and autoimmune conditions. Hepatitis C  Blood testing is recommended for:  Everyone born from [birth year] through 1965.  Anyone with known risk factors for hepatitis C. Sexually transmitted infections (STIs)  You should be screened for sexually transmitted infections (STIs) including gonorrhea and chlamydia if:  You are sexually active and are younger than 74 years of age.  You are older than 74 years of age and your health care provider tells you that you are at risk for this type of infection.  Your sexual activity has changed since you were last screened and you are at an increased risk for chlamydia or gonorrhea. Ask your health care provider if you are at risk.  If you do not have HIV, but are at risk, it may be recommended that you take a prescription medicine daily to prevent HIV infection. This is called pre-exposure prophylaxis (PrEP). You are considered at risk if:  You are sexually active and do not regularly use condoms or know the HIV status of your partner(s).  You take drugs by injection.  You are sexually active with a partner who has HIV. Talk with your health care provider about whether you are at high risk of being infected with HIV. If you choose to begin PrEP, you should first be tested for HIV. You should then be tested every 3 months for  as long as you are taking PrEP.  PREGNANCY   If you are premenopausal and you may become pregnant, ask your health care provider about preconception counseling.  If you may become pregnant, take 400 to 800 micrograms (mcg) of folic acid every day.  If you want to prevent pregnancy, talk to your  health care provider about birth control (contraception). OSTEOPOROSIS AND MENOPAUSE   Osteoporosis is a disease in which the bones lose minerals and strength with aging. This can result in serious bone fractures. Your risk for osteoporosis can be identified using a bone density scan.  If you are 65 years of age or older, or if you are at risk for osteoporosis and fractures, ask your health care provider if you should be screened.  Ask your health care provider whether you should take a calcium or vitamin D supplement to lower your risk for osteoporosis.  Menopause may have certain physical symptoms and risks.  Hormone replacement therapy may reduce some of these symptoms and risks. Talk to your health care provider about whether hormone replacement therapy is right for you.  HOME CARE INSTRUCTIONS   Schedule regular health, dental, and eye exams.  Stay current with your immunizations.   Do not use any tobacco products including cigarettes, chewing tobacco, or electronic cigarettes.  If you are pregnant, do not drink alcohol.  If you are breastfeeding, limit how much and how often you drink alcohol.  Limit alcohol intake to no more than 1 drink per day for nonpregnant women. One drink equals 12 ounces of beer, 5 ounces of wine, or 1 ounces of hard liquor.  Do not use street drugs.  Do not share needles.  Ask your health care provider for help if you need support or information about quitting drugs.  Tell your health care provider if you often feel depressed.  Tell your health care provider if you have ever been abused or do not feel safe at home. Document Released: 12/02/2010  Document Revised: 10/03/2013 Document Reviewed: 04/20/2013 Exit[employer] Patient Information 2015 [employer]. This information is not intended to replace advice given to you by your health care provider. Make sure you discuss any questions you have with your health care provider. Generalized Anxiety Disorder Generalized anxiety disorder (GAD) is a mental disorder. It interferes with life functions, including relationships, work, and school. GAD is different from normal anxiety, which everyone experiences at some point in their lives in response to specific life events and activities. Normal anxiety actually helps us prepare for and get through these life events and activities. Normal anxiety goes away after the event or activity is over.  GAD causes anxiety that is not necessarily related to specific events or activities. It also causes excess anxiety in proportion to specific events or activities. The anxiety associated with GAD is also difficult to control. GAD can vary from mild to severe. People with severe GAD can have intense waves of anxiety with physical symptoms (panic attacks).  SYMPTOMS The anxiety and worry associated with GAD are difficult to control. This anxiety and worry are related to many life events and activities and also occur more days than not for 6 months or longer. People with GAD also have three or more of the following symptoms (one or more in children):  Restlessness.   Fatigue.  Difficulty concentrating.   Irritability.  Muscle tension.  Difficulty sleeping or unsatisfying sleep. DIAGNOSIS GAD is diagnosed through an assessment by your health care provider. Your health care provider will ask you questions aboutyour mood,physical symptoms, and events in your life. Your health care provider may ask you about your medical history and use of alcohol or drugs, including prescription medicines. Your health care provider may also do a physical exam and blood tests.  Certain medical conditions and the use of certain substances can cause   symptoms similar to those associated with GAD. Your health care provider may refer you to a mental health specialist for further evaluation. TREATMENT The following therapies are usually used to treat GAD:   Medication. Antidepressant medication usually is prescribed for long-term daily control. Antianxiety medicines may be added in severe cases, especially when panic attacks occur.   Talk therapy (psychotherapy). Certain types of talk therapy can be helpful in treating GAD by providing support, education, and guidance. A form of talk therapy called cognitive behavioral therapy can teach you healthy ways to think about and react to daily life events and activities.  Stress managementtechniques. These include yoga, meditation, and exercise and can be very helpful when they are practiced regularly. A mental health specialist can help determine which treatment is best for you. Some people see improvement with one therapy. However, other people require a combination of therapies. Document Released: 09/13/2012 Document Revised: 10/03/2013 Document Reviewed: 09/13/2012 Exit[employer] Patient Information 2015 [employer]. This information is not intended to replace advice given to you by your health care provider. Make sure you discuss any questions you have with your health care provider.  

## 2015-01-16 NOTE — Addendum Note (Signed)
Addended by: Evelina Dun A on: 01/16/2015 09:07 AM   Modules accepted: Orders

## 2015-01-16 NOTE — Addendum Note (Signed)
Addended by: Shelbie Ammons on: 01/16/2015 09:38 AM   Modules accepted: Orders

## 2015-01-16 NOTE — Progress Notes (Addendum)
Subjective:    Patient ID: Andrea Moreno, female    DOB: Mar 15, 1941, 74 y.o.   MRN: [id number]  Pt presents to the office today for chronic follow up. Pt is complaining of hair thinning and fatigue. Pt states she would like her thyroid checked.  Hypertension This is a chronic problem. The current episode started more than 1 year ago. The problem has been resolved since onset. The problem is controlled. Associated symptoms include anxiety. Pertinent negatives include no headaches, palpitations, peripheral edema or shortness of breath. Risk factors for coronary artery disease include dyslipidemia, family history, obesity and post-menopausal state. Past treatments include angiotensin blockers and diuretics. The current treatment provides significant improvement. Hypertensive end-organ damage includes CVA (TIA). There is no history of kidney disease, CAD/MI, heart failure or a thyroid problem. There is no history of sleep apnea.  Hyperlipidemia This is a chronic problem. The current episode started more than 1 year ago. The problem is uncontrolled. Recent lipid tests were reviewed and are high. She has no history of diabetes or hypothyroidism. Pertinent negatives include no leg pain, myalgias or shortness of breath. Current antihyperlipidemic treatment includes ezetimibe and diet change. The current treatment provides moderate improvement of lipids. Risk factors for coronary artery disease include hypertension, post-menopausal, dyslipidemia and family history.  Anxiety Presents for initial visit. Onset was 6 to 12 months ago. The problem has been waxing and waning. Symptoms include excessive worry, insomnia and nervous/anxious behavior. Patient reports no palpitations, panic or shortness of breath. Symptoms occur most days (nightly). The severity of symptoms is moderate. The quality of sleep is poor. Nighttime awakenings: several, occasional.   Her past medical history is significant for anxiety/panic  attacks. Past treatments include lifestyle changes. The treatment provided no relief. Compliance with prior treatments has been good.      Review of Systems  Constitutional: Positive for fatigue.  HENT: Negative.   Eyes: Negative.   Respiratory: Negative.  Negative for shortness of breath.   Cardiovascular: Negative.  Negative for palpitations.  Gastrointestinal: Negative.   Endocrine: Negative.   Genitourinary: Negative.   Musculoskeletal: Negative.  Negative for myalgias.  Neurological: Negative.  Negative for headaches.  Hematological: Negative.   Psychiatric/Behavioral: The patient is nervous/anxious and has insomnia.   All other systems reviewed and are negative.      Objective:   Physical Exam  Constitutional: She is oriented to person, place, and time. She appears well-developed and well-nourished. No distress.  HENT:  Head: Normocephalic and atraumatic.  Right Ear: External ear normal.  Left Ear: External ear normal.  Nose: Nose normal.  Mouth/Throat: Oropharynx is clear and moist.  Eyes: Pupils are equal, round, and reactive to light.  Neck: Normal range of motion. Neck supple. No thyromegaly present.  Cardiovascular: Normal rate, regular rhythm, normal heart sounds and intact distal pulses.   No murmur heard. Pulmonary/Chest: Effort normal and breath sounds normal. No respiratory distress. She has no wheezes.  Abdominal: Soft. Bowel sounds are normal. She exhibits no distension. There is no tenderness.  Musculoskeletal: Normal range of motion. She exhibits no edema or tenderness.  Neurological: She is alert and oriented to person, place, and time. She has normal reflexes. No cranial nerve deficit.  Skin: Skin is warm and dry.  Psychiatric: She has a normal mood and affect. Her behavior is normal. Judgment and thought content normal.  Vitals reviewed.   BP 132/72 mmHg  Temp(Src) 97.7 F (36.5 C) (Oral)  Ht 5' 2.5" (1.588 m)  Wt  155 lb 9.6 oz (70.58 kg)  BMI  27.99 kg/m2       Assessment & Plan:  1. Essential hypertension - CMP14+EGFR - losartan-hydrochlorothiazide (HYZAAR) 100-25 MG per tablet; TAKE 1 TABLET DAILY  Dispense: 30 tablet; Refill: 11  2. Osteopenia - CMP14+EGFR - raloxifene (EVISTA) 60 MG tablet; Take 1 tablet (60 mg total) by mouth daily.  Dispense: 30 tablet; Refill: 11  3. Hyperlipidemia - CMP14+EGFR - Lipid panel - ezetimibe (ZETIA) 10 MG tablet; TAKE 1 TABLET DAILY  Dispense: 30 tablet; Refill: 11  4. Vitamin D deficiency - CMP14+EGFR - Vit D  25 hydroxy (rtn osteoporosis monitoring)  5. Hair thinning - CMP14+EGFR - Thyroid Panel With TSH  6. Other fatigue - CMP14+EGFR - Thyroid Panel With TSH - Vit D  25 hydroxy (rtn osteoporosis monitoring)  7. Insomnia -Bed time ritual - ALPRAZolam (XANAX) 0.25 MG tablet; Take 1 tablet (0.25 mg total) by mouth at bedtime as needed for anxiety.  Dispense: 30 tablet; Refill: 3  8. GAD (generalized anxiety disorder) -Stress managment - ALPRAZolam (XANAX) 0.25 MG tablet; Take 1 tablet (0.25 mg total) by mouth at bedtime as needed for anxiety.  Dispense: 30 tablet; Refill: 3   Continue all meds Labs pending Health Maintenance reviewed- Pt refuses mammogram Diet and exercise encouraged RTO 6 months   Evelina Dun, FNP

## 2015-01-17 ENCOUNTER — Ambulatory Visit: Payer: Medicare Other | Admitting: Family

## 2015-01-17 LAB — LIPID PANEL
CHOL/HDL RATIO: 2.8 ratio (ref 0.0–4.4)
Cholesterol, Total: 169 mg/dL (ref 100–199)
HDL: 60 mg/dL (ref >39)
LDL Calculated: 91 mg/dL (ref 0–99)
Triglycerides: 92 mg/dL (ref 0–149)
VLDL Cholesterol Cal: 18 mg/dL (ref 5–40)

## 2015-01-17 LAB — CMP14+EGFR
ALT: 18 IU/L (ref 0–32)
AST: 18 IU/L (ref 0–40)
Albumin/Globulin Ratio: 2.4 (ref 1.1–2.5)
Albumin: 4.3 g/dL (ref 3.5–4.8)
Alkaline Phosphatase: 33 IU/L — ABNORMAL LOW (ref 39–117)
BUN/Creatinine Ratio: 22 (ref 11–26)
BUN: 18 mg/dL (ref 8–27)
Bilirubin Total: 0.5 mg/dL (ref 0.0–1.2)
CALCIUM: 9 mg/dL (ref 8.7–10.3)
CO2: 24 mmol/L (ref 18–29)
Chloride: 103 mmol/L (ref 97–108)
Creatinine, Ser: 0.81 mg/dL (ref 0.57–1.00)
GFR, EST AFRICAN AMERICAN: 83 mL/min/{1.73_m2} (ref >59)
GFR, EST NON AFRICAN AMERICAN: 72 mL/min/{1.73_m2} (ref >59)
GLUCOSE: 101 mg/dL — AB (ref 65–99)
Globulin, Total: 1.8 g/dL (ref 1.5–4.5)
Potassium: 4.1 mmol/L (ref 3.5–5.2)
Sodium: 142 mmol/L (ref 134–144)
TOTAL PROTEIN: 6.1 g/dL (ref 6.0–8.5)

## 2015-01-17 LAB — THYROID PANEL WITH TSH
Free Thyroxine Index: 1.6 (ref 1.2–4.9)
T3 Uptake Ratio: 25 % (ref 24–39)
T4, Total: 6.4 ug/dL (ref 4.5–12.0)
TSH: 2.75 u[IU]/mL (ref 0.450–4.500)

## 2015-01-17 LAB — VITAMIN D 25 HYDROXY (VIT D DEFICIENCY, FRACTURES): Vit D, 25-Hydroxy: 36.8 ng/mL (ref 30.0–100.0)

## 2015-03-20 DIAGNOSIS — Z23 Encounter for immunization: Secondary | ICD-10-CM | POA: Diagnosis not present

## 2015-04-03 DIAGNOSIS — M7071 Other bursitis of hip, right hip: Secondary | ICD-10-CM | POA: Diagnosis not present

## 2015-06-07 DIAGNOSIS — H04123 Dry eye syndrome of bilateral lacrimal glands: Secondary | ICD-10-CM | POA: Diagnosis not present

## 2015-06-07 DIAGNOSIS — H538 Other visual disturbances: Secondary | ICD-10-CM | POA: Diagnosis not present

## 2015-06-07 DIAGNOSIS — H2513 Age-related nuclear cataract, bilateral: Secondary | ICD-10-CM | POA: Diagnosis not present

## 2015-06-20 DIAGNOSIS — M7061 Trochanteric bursitis, right hip: Secondary | ICD-10-CM | POA: Diagnosis not present

## 2015-07-13 ENCOUNTER — Encounter: Payer: Self-pay | Admitting: Family

## 2015-07-18 ENCOUNTER — Telehealth: Payer: Self-pay | Admitting: Internal Medicine

## 2015-07-18 NOTE — Telephone Encounter (Signed)
RECALL FOR TCS °

## 2015-07-19 NOTE — Telephone Encounter (Signed)
Letter mailed to pt.  

## 2015-07-20 ENCOUNTER — Encounter: Payer: Self-pay | Admitting: Family

## 2015-07-20 ENCOUNTER — Ambulatory Visit (INDEPENDENT_AMBULATORY_CARE_PROVIDER_SITE_OTHER): Payer: Medicare Other | Admitting: Family

## 2015-07-20 VITALS — BP 132/64 | HR 58 | Temp 97.0°F | Ht 62.5 in | Wt 153.8 lb

## 2015-07-20 DIAGNOSIS — M8580 Other specified disorders of bone density and structure, unspecified site: Secondary | ICD-10-CM

## 2015-07-20 DIAGNOSIS — Z1211 Encounter for screening for malignant neoplasm of colon: Secondary | ICD-10-CM

## 2015-07-20 DIAGNOSIS — E785 Hyperlipidemia, unspecified: Secondary | ICD-10-CM

## 2015-07-20 DIAGNOSIS — F411 Generalized anxiety disorder: Secondary | ICD-10-CM | POA: Diagnosis not present

## 2015-07-20 DIAGNOSIS — I10 Essential (primary) hypertension: Secondary | ICD-10-CM | POA: Diagnosis not present

## 2015-07-20 DIAGNOSIS — Z8673 Personal history of transient ischemic attack (TIA), and cerebral infarction without residual deficits: Secondary | ICD-10-CM | POA: Diagnosis not present

## 2015-07-20 DIAGNOSIS — M159 Polyosteoarthritis, unspecified: Secondary | ICD-10-CM

## 2015-07-20 DIAGNOSIS — M1990 Unspecified osteoarthritis, unspecified site: Secondary | ICD-10-CM | POA: Insufficient documentation

## 2015-07-20 DIAGNOSIS — E559 Vitamin D deficiency, unspecified: Secondary | ICD-10-CM

## 2015-07-20 NOTE — Patient Instructions (Signed)
Health Maintenance, Female Adopting a healthy lifestyle and getting preventive care can go a long way to promote health and wellness. Talk with your health care provider about what schedule of regular examinations is right for you. This is a good chance for you to check in with your provider about disease prevention and staying healthy. In between checkups, there are plenty of things you can do on your own. Experts have done a lot of research about which lifestyle changes and preventive measures are most likely to keep you healthy. Ask your health care provider for more information. WEIGHT AND DIET  Eat a healthy diet  Be sure to include plenty of vegetables, fruits, low-fat dairy products, and lean protein.  Do not eat a lot of foods high in solid fats, added sugars, or salt.  Get regular exercise. This is one of the most important things you can do for your health.  Most adults should exercise for at least 150 minutes each week. The exercise should increase your heart rate and make you sweat (moderate-intensity exercise).  Most adults should also do strengthening exercises at least twice a week. This is in addition to the moderate-intensity exercise.  Maintain a healthy weight  Body mass index (BMI) is a measurement that can be used to identify possible weight problems. It estimates body fat based on height and weight. Your health care provider can help determine your BMI and help you achieve or maintain a healthy weight.  For females 20 years of age and older:   A BMI below 18.5 is considered underweight.  A BMI of 18.5 to 24.9 is normal.  A BMI of 25 to 29.9 is considered overweight.  A BMI of 30 and above is considered obese.  Watch levels of cholesterol and blood lipids  You should start having your blood tested for lipids and cholesterol at 75 years of age, then have this test every 5 years.  You may need to have your cholesterol levels checked more often if:  Your lipid  or cholesterol levels are high.  You are older than 75 years of age.  You are at high risk for heart disease.  CANCER SCREENING   Lung Cancer  Lung cancer screening is recommended for adults 55-80 years old who are at high risk for lung cancer because of a history of smoking.  A yearly low-dose CT scan of the lungs is recommended for people who:  Currently smoke.  Have quit within the past 15 years.  Have at least a 30-pack-year history of smoking. A pack year is smoking an average of one pack of cigarettes a day for 1 year.  Yearly screening should continue until it has been 15 years since you quit.  Yearly screening should stop if you develop a health problem that would prevent you from having lung cancer treatment.  Breast Cancer  Practice breast self-awareness. This means understanding how your breasts normally appear and feel.  It also means doing regular breast self-exams. Let your health care provider know about any changes, no matter how small.  If you are in your 20s or 30s, you should have a clinical breast exam (CBE) by a health care provider every 1-3 years as part of a regular health exam.  If you are 40 or older, have a CBE every year. Also consider having a breast X-ray (mammogram) every year.  If you have a family history of breast cancer, talk to your health care provider about genetic screening.  If you   are at high risk for breast cancer, talk to your health care provider about having an MRI and a mammogram every year.  Breast cancer gene (BRCA) assessment is recommended for women who have family members with BRCA-related cancers. BRCA-related cancers include:  Breast.  Ovarian.  Tubal.  Peritoneal cancers.  Results of the assessment will determine the need for genetic counseling and BRCA1 and BRCA2 testing. Cervical Cancer Your health care provider may recommend that you be screened regularly for cancer of the pelvic organs (ovaries, uterus, and  vagina). This screening involves a pelvic examination, including checking for microscopic changes to the surface of your cervix (Pap test). You may be encouraged to have this screening done every 3 years, beginning at age 21.  For women ages 30-65, health care providers may recommend pelvic exams and Pap testing every 3 years, or they may recommend the Pap and pelvic exam, combined with testing for human papilloma virus (HPV), every 5 years. Some types of HPV increase your risk of cervical cancer. Testing for HPV may also be done on women of any age with unclear Pap test results.  Other health care providers may not recommend any screening for nonpregnant women who are considered low risk for pelvic cancer and who do not have symptoms. Ask your health care provider if a screening pelvic exam is right for you.  If you have had past treatment for cervical cancer or a condition that could lead to cancer, you need Pap tests and screening for cancer for at least 20 years after your treatment. If Pap tests have been discontinued, your risk factors (such as having a new sexual partner) need to be reassessed to determine if screening should resume. Some women have medical problems that increase the chance of getting cervical cancer. In these cases, your health care provider may recommend more frequent screening and Pap tests. Colorectal Cancer  This type of cancer can be detected and often prevented.  Routine colorectal cancer screening usually begins at 75 years of age and continues through 75 years of age.  Your health care provider may recommend screening at an earlier age if you have risk factors for colon cancer.  Your health care provider may also recommend using home test kits to check for hidden blood in the stool.  A small camera at the end of a tube can be used to examine your colon directly (sigmoidoscopy or colonoscopy). This is done to check for the earliest forms of colorectal  cancer.  Routine screening usually begins at age 50.  Direct examination of the colon should be repeated every 5-10 years through 75 years of age. However, you may need to be screened more often if early forms of precancerous polyps or small growths are found. Skin Cancer  Check your skin from head to toe regularly.  Tell your health care provider about any new moles or changes in moles, especially if there is a change in a mole's shape or color.  Also tell your health care provider if you have a mole that is larger than the size of a pencil eraser.  Always use sunscreen. Apply sunscreen liberally and repeatedly throughout the day.  Protect yourself by wearing long sleeves, pants, a wide-brimmed hat, and sunglasses whenever you are outside. HEART DISEASE, DIABETES, AND HIGH BLOOD PRESSURE   High blood pressure causes heart disease and increases the risk of stroke. High blood pressure is more likely to develop in:  People who have blood pressure in the high end   of the normal range (130-139/85-89 mm Hg).  People who are overweight or obese.  People who are African American.  If you are 38-23 years of age, have your blood pressure checked every 3-5 years. If you are 61 years of age or older, have your blood pressure checked every year. You should have your blood pressure measured twice--once when you are at a hospital or clinic, and once when you are not at a hospital or clinic. Record the average of the two measurements. To check your blood pressure when you are not at a hospital or clinic, you can use:  An automated blood pressure machine at a pharmacy.  A home blood pressure monitor.  If you are between 45 years and 39 years old, ask your health care provider if you should take aspirin to prevent strokes.  Have regular diabetes screenings. This involves taking a blood sample to check your fasting blood sugar level.  If you are at a normal weight and have a low risk for diabetes,  have this test once every three years after 75 years of age.  If you are overweight and have a high risk for diabetes, consider being tested at a younger age or more often. PREVENTING INFECTION  Hepatitis B  If you have a higher risk for hepatitis B, you should be screened for this virus. You are considered at high risk for hepatitis B if:  You were born in a country where hepatitis B is common. Ask your health care provider which countries are considered high risk.  Your parents were born in a high-risk country, and you have not been immunized against hepatitis B (hepatitis B vaccine).  You have HIV or AIDS.  You use needles to inject street drugs.  You live with someone who has hepatitis B.  You have had sex with someone who has hepatitis B.  You get hemodialysis treatment.  You take certain medicines for conditions, including cancer, organ transplantation, and autoimmune conditions. Hepatitis C  Blood testing is recommended for:  Everyone born from [birth year] through 1965.  Anyone with known risk factors for hepatitis C. Sexually transmitted infections (STIs)  You should be screened for sexually transmitted infections (STIs) including gonorrhea and chlamydia if:  You are sexually active and are younger than 75 years of age.  You are older than 75 years of age and your health care provider tells you that you are at risk for this type of infection.  Your sexual activity has changed since you were last screened and you are at an increased risk for chlamydia or gonorrhea. Ask your health care provider if you are at risk.  If you do not have HIV, but are at risk, it may be recommended that you take a prescription medicine daily to prevent HIV infection. This is called pre-exposure prophylaxis (PrEP). You are considered at risk if:  You are sexually active and do not regularly use condoms or know the HIV status of your partner(s).  You take drugs by injection.  You are sexually  active with a partner who has HIV. Talk with your health care provider about whether you are at high risk of being infected with HIV. If you choose to begin PrEP, you should first be tested for HIV. You should then be tested every 3 months for as long as you are taking PrEP.  PREGNANCY   If you are premenopausal and you may become pregnant, ask your health care provider about preconception counseling.  If you may  become pregnant, take 400 to 800 micrograms (mcg) of folic acid every day.  If you want to prevent pregnancy, talk to your health care provider about birth control (contraception). OSTEOPOROSIS AND MENOPAUSE   Osteoporosis is a disease in which the bones lose minerals and strength with aging. This can result in serious bone fractures. Your risk for osteoporosis can be identified using a bone density scan.  If you are 61 years of age or older, or if you are at risk for osteoporosis and fractures, ask your health care provider if you should be screened.  Ask your health care provider whether you should take a calcium or vitamin D supplement to lower your risk for osteoporosis.  Menopause may have certain physical symptoms and risks.  Hormone replacement therapy may reduce some of these symptoms and risks. Talk to your health care provider about whether hormone replacement therapy is right for you.  HOME CARE INSTRUCTIONS   Schedule regular health, dental, and eye exams.  Stay current with your immunizations.   Do not use any tobacco products including cigarettes, chewing tobacco, or electronic cigarettes.  If you are pregnant, do not drink alcohol.  If you are breastfeeding, limit how much and how often you drink alcohol.  Limit alcohol intake to no more than 1 drink per day for nonpregnant women. One drink equals 12 ounces of beer, 5 ounces of wine, or 1 ounces of hard liquor.  Do not use street drugs.  Do not share needles.  Ask your health care provider for help if  you need support or information about quitting drugs.  Tell your health care provider if you often feel depressed.  Tell your health care provider if you have ever been abused or do not feel safe at home.   This information is not intended to replace advice given to you by your health care provider. Make sure you discuss any questions you have with your health care provider.   Document Released: 12/02/2010 Document Revised: 06/09/2014 Document Reviewed: 04/20/2013 Elsevier Interactive Patient Education [employer].

## 2015-07-20 NOTE — Progress Notes (Signed)
Subjective:    Patient ID: Andrea Moreno, female    DOB: January 28, 1941, 75 y.o.   MRN: [id number]  Pt presents to the office today for chronic follow up.  Hypertension This is a chronic problem. The current episode started more than 1 year ago. The problem has been resolved since onset. The problem is controlled. Associated symptoms include anxiety. Pertinent negatives include no headaches, palpitations, peripheral edema or shortness of breath. Risk factors for coronary artery disease include dyslipidemia, family history, obesity and post-menopausal state. Past treatments include angiotensin blockers and diuretics. The current treatment provides significant improvement. Hypertensive end-organ damage includes CVA (TIA). There is no history of kidney disease, CAD/MI, heart failure or a thyroid problem. There is no history of sleep apnea.  Hyperlipidemia This is a chronic problem. The current episode started more than 1 year ago. The problem is controlled. Recent lipid tests were reviewed and are normal. She has no history of diabetes or hypothyroidism. Pertinent negatives include no leg pain, myalgias or shortness of breath. Current antihyperlipidemic treatment includes ezetimibe and diet change. The current treatment provides moderate improvement of lipids. Risk factors for coronary artery disease include hypertension, post-menopausal, dyslipidemia and family history.  Anxiety Presents for initial visit. Onset was 6 to 12 months ago. The problem has been waxing and waning. Symptoms include excessive worry, insomnia and nervous/anxious behavior. Patient reports no decreased concentration, palpitations, panic or shortness of breath. Symptoms occur most days (nightly). The severity of symptoms is moderate. The quality of sleep is poor. Nighttime awakenings: several, occasional.   Her past medical history is significant for anxiety/panic attacks. Past treatments include lifestyle changes. The treatment  provided no relief. Compliance with prior treatments has been good.  Arthritis Presents for follow-up visit. The disease course has been worsening. She complains of pain. Affected locations include the left MCP, left hip and left knee. Associated symptoms include fatigue and pain while resting. Her past medical history is significant for osteoarthritis. Past treatments include rest and NSAIDs. Factors aggravating her arthritis include activity. Compliance with prior treatments has been good.      Review of Systems  Constitutional: Positive for fatigue.  HENT: Negative.   Eyes: Negative.   Respiratory: Negative.  Negative for shortness of breath.   Cardiovascular: Negative.  Negative for palpitations.  Gastrointestinal: Negative.   Endocrine: Negative.   Genitourinary: Negative.   Musculoskeletal: Positive for arthritis. Negative for myalgias.  Neurological: Negative.  Negative for headaches.  Hematological: Negative.   Psychiatric/Behavioral: Negative for decreased concentration. The patient is nervous/anxious and has insomnia.   All other systems reviewed and are negative.      Objective:   Physical Exam  Constitutional: She is oriented to person, place, and time. She appears well-developed and well-nourished. No distress.  HENT:  Head: Normocephalic and atraumatic.  Right Ear: External ear normal.  Left Ear: External ear normal.  Nose: Nose normal.  Mouth/Throat: Oropharynx is clear and moist.  Eyes: Pupils are equal, round, and reactive to light.  Neck: Normal range of motion. Neck supple. No thyromegaly present.  Cardiovascular: Normal rate, regular rhythm, normal heart sounds and intact distal pulses.   No murmur heard. Pulmonary/Chest: Effort normal and breath sounds normal. No respiratory distress. She has no wheezes.  Abdominal: Soft. Bowel sounds are normal. She exhibits no distension. There is no tenderness.  Musculoskeletal: Normal range of motion. She exhibits no  edema or tenderness.  Neurological: She is alert and oriented to person, place, and time. She has  normal reflexes. No cranial nerve deficit.  Skin: Skin is warm and dry.  Psychiatric: She has a normal mood and affect. Her behavior is normal. Judgment and thought content normal.  Vitals reviewed.   BP 132/64 mmHg  Pulse 58  Temp(Src) 97 F (36.1 C) (Oral)  Ht 5' 2.5" (1.588 m)  Wt 153 lb 12.8 oz (69.763 kg)  BMI 27.66 kg/m2  SpO2 99%       Assessment & Plan:  1. Essential hypertension - CMP14+EGFR  2. Osteopenia - CMP14+EGFR  3. Hyperlipidemia - CMP14+EGFR - Lipid panel  4. Vitamin D deficiency - CMP14+EGFR - VITAMIN D 25 Hydroxy (Vit-D Deficiency, Fractures)  5. GAD (generalized anxiety disorder) - CMP14+EGFR  6. History of TIA (transient ischemic attack - CMP14+EGFR  7. Osteoarthritis of multiple joints, unspecified osteoarthritis type - CMP14+EGFR  8. Colon cancer screening - CMP14+EGFR - Fecal occult blood, imunochemical; Future   Continue all meds Labs pending Health Maintenance reviewed Diet and exercise encouraged RTO 6 months  Evelina Dun, FNP

## 2015-07-21 LAB — CMP14+EGFR
ALT: 27 IU/L (ref 0–32)
AST: 17 IU/L (ref 0–40)
Albumin/Globulin Ratio: 1.9 (ref 1.1–2.5)
Albumin: 3.9 g/dL (ref 3.5–4.8)
Alkaline Phosphatase: 32 IU/L — ABNORMAL LOW (ref 39–117)
BUN/Creatinine Ratio: 28 — ABNORMAL HIGH (ref 11–26)
BUN: 22 mg/dL (ref 8–27)
Bilirubin Total: 0.4 mg/dL (ref 0.0–1.2)
CALCIUM: 8.9 mg/dL (ref 8.7–10.3)
CO2: 23 mmol/L (ref 18–29)
CREATININE: 0.79 mg/dL (ref 0.57–1.00)
Chloride: 103 mmol/L (ref 96–106)
GFR, EST AFRICAN AMERICAN: 85 mL/min/{1.73_m2} (ref >59)
GFR, EST NON AFRICAN AMERICAN: 74 mL/min/{1.73_m2} (ref >59)
GLOBULIN, TOTAL: 2.1 g/dL (ref 1.5–4.5)
Glucose: 98 mg/dL (ref 65–99)
Potassium: 4.1 mmol/L (ref 3.5–5.2)
SODIUM: 143 mmol/L (ref 134–144)
TOTAL PROTEIN: 6 g/dL (ref 6.0–8.5)

## 2015-07-21 LAB — LIPID PANEL
Chol/HDL Ratio: 2.6 ratio units (ref 0.0–4.4)
Cholesterol, Total: 167 mg/dL (ref 100–199)
HDL: 65 mg/dL (ref >39)
LDL CALC: 85 mg/dL (ref 0–99)
TRIGLYCERIDES: 87 mg/dL (ref 0–149)
VLDL Cholesterol Cal: 17 mg/dL (ref 5–40)

## 2015-07-21 LAB — VITAMIN D 25 HYDROXY (VIT D DEFICIENCY, FRACTURES): Vit D, 25-Hydroxy: 27.8 ng/mL — ABNORMAL LOW (ref 30.0–100.0)

## 2015-07-23 ENCOUNTER — Other Ambulatory Visit: Payer: Self-pay | Admitting: Family

## 2015-08-08 ENCOUNTER — Other Ambulatory Visit: Payer: Self-pay | Admitting: Family

## 2015-09-18 DIAGNOSIS — M1611 Unilateral primary osteoarthritis, right hip: Secondary | ICD-10-CM | POA: Diagnosis not present

## 2015-09-18 DIAGNOSIS — M7071 Other bursitis of hip, right hip: Secondary | ICD-10-CM | POA: Diagnosis not present

## 2015-09-26 DIAGNOSIS — M1611 Unilateral primary osteoarthritis, right hip: Secondary | ICD-10-CM | POA: Diagnosis not present

## 2015-09-26 DIAGNOSIS — M25551 Pain in right hip: Secondary | ICD-10-CM | POA: Diagnosis not present

## 2015-10-23 ENCOUNTER — Other Ambulatory Visit: Payer: Self-pay | Admitting: Family

## 2015-10-23 NOTE — Telephone Encounter (Signed)
rx called into pharmacy

## 2015-10-23 NOTE — Telephone Encounter (Signed)
Last filled 06/13/15, last seen 07/20/15. Route to pool

## 2016-01-01 ENCOUNTER — Other Ambulatory Visit: Payer: Self-pay | Admitting: Family

## 2016-01-01 DIAGNOSIS — I10 Essential (primary) hypertension: Secondary | ICD-10-CM

## 2016-01-01 DIAGNOSIS — M25551 Pain in right hip: Secondary | ICD-10-CM | POA: Diagnosis not present

## 2016-01-01 DIAGNOSIS — M1611 Unilateral primary osteoarthritis, right hip: Secondary | ICD-10-CM | POA: Diagnosis not present

## 2016-01-01 DIAGNOSIS — E785 Hyperlipidemia, unspecified: Secondary | ICD-10-CM

## 2016-01-04 DIAGNOSIS — M1611 Unilateral primary osteoarthritis, right hip: Secondary | ICD-10-CM | POA: Diagnosis not present

## 2016-01-04 DIAGNOSIS — M25551 Pain in right hip: Secondary | ICD-10-CM | POA: Diagnosis not present

## 2016-01-10 DIAGNOSIS — R938 Abnormal findings on diagnostic imaging of other specified body structures: Secondary | ICD-10-CM | POA: Diagnosis not present

## 2016-01-10 DIAGNOSIS — Z01818 Encounter for other preprocedural examination: Secondary | ICD-10-CM | POA: Diagnosis not present

## 2016-01-10 DIAGNOSIS — M1611 Unilateral primary osteoarthritis, right hip: Secondary | ICD-10-CM | POA: Diagnosis not present

## 2016-01-10 DIAGNOSIS — M25551 Pain in right hip: Secondary | ICD-10-CM | POA: Diagnosis not present

## 2016-01-14 DIAGNOSIS — M1611 Unilateral primary osteoarthritis, right hip: Secondary | ICD-10-CM | POA: Diagnosis not present

## 2016-01-15 ENCOUNTER — Ambulatory Visit: Payer: Medicare Other | Admitting: Pharmacist

## 2016-01-17 ENCOUNTER — Ambulatory Visit (INDEPENDENT_AMBULATORY_CARE_PROVIDER_SITE_OTHER): Payer: Medicare Other | Admitting: Family

## 2016-01-17 ENCOUNTER — Encounter: Payer: Self-pay | Admitting: Family

## 2016-01-17 VITALS — BP 130/68 | HR 56 | Temp 96.6°F | Ht 62.5 in | Wt 153.4 lb

## 2016-01-17 DIAGNOSIS — M159 Polyosteoarthritis, unspecified: Secondary | ICD-10-CM | POA: Diagnosis not present

## 2016-01-17 DIAGNOSIS — Z1211 Encounter for screening for malignant neoplasm of colon: Secondary | ICD-10-CM | POA: Diagnosis not present

## 2016-01-17 DIAGNOSIS — E785 Hyperlipidemia, unspecified: Secondary | ICD-10-CM | POA: Diagnosis not present

## 2016-01-17 DIAGNOSIS — E559 Vitamin D deficiency, unspecified: Secondary | ICD-10-CM | POA: Diagnosis not present

## 2016-01-17 DIAGNOSIS — M8580 Other specified disorders of bone density and structure, unspecified site: Secondary | ICD-10-CM

## 2016-01-17 DIAGNOSIS — F411 Generalized anxiety disorder: Secondary | ICD-10-CM

## 2016-01-17 DIAGNOSIS — I10 Essential (primary) hypertension: Secondary | ICD-10-CM

## 2016-01-17 NOTE — Progress Notes (Signed)
Subjective:    Patient ID: Andrea Moreno, female    DOB: Oct 29, 1940, 75 y.o.   MRN: [id number]  Pt presents to the office today for chronic follow up.  Hypertension  This is a chronic problem. The current episode started more than 1 year ago. The problem has been resolved since onset. The problem is controlled. Associated symptoms include anxiety. Pertinent negatives include no headaches, palpitations, peripheral edema or shortness of breath. Risk factors for coronary artery disease include dyslipidemia, family history, obesity and post-menopausal state. Past treatments include angiotensin blockers and diuretics. The current treatment provides significant improvement. Hypertensive end-organ damage includes CVA (TIA). There is no history of kidney disease, CAD/MI, heart failure or a thyroid problem. There is no history of sleep apnea.  Hyperlipidemia  This is a chronic problem. The current episode started more than 1 year ago. The problem is controlled. Recent lipid tests were reviewed and are normal. Exacerbating diseases include obesity. She has no history of diabetes or hypothyroidism. Pertinent negatives include no leg pain, myalgias or shortness of breath. Current antihyperlipidemic treatment includes ezetimibe and diet change. The current treatment provides moderate improvement of lipids. Risk factors for coronary artery disease include hypertension, post-menopausal, dyslipidemia and family history.  Anxiety  Presents for initial visit. Onset was 6 to 12 months ago. The problem has been waxing and waning. Patient reports no decreased concentration, excessive worry, insomnia, nervous/anxious behavior, palpitations, panic, shortness of breath or suicidal ideas. Symptoms occur most days (nightly). The severity of symptoms is moderate. The quality of sleep is poor. Nighttime awakenings: several, occasional.   Her past medical history is significant for anxiety/panic attacks. Past treatments  include lifestyle changes and benzodiazephines. The treatment provided no relief. Compliance with prior treatments has been good.  Arthritis  Presents for follow-up visit. The disease course has been worsening. She complains of pain. Affected locations include the left MCP and right hip. Her pain is at a severity of 4/10. Associated symptoms include pain while resting. Pertinent negatives include no fatigue. Her past medical history is significant for osteoarthritis. Past treatments include rest and NSAIDs. Factors aggravating her arthritis include activity. Compliance with prior treatments has been good.  Osteopenia PT currently taking Evista 60 mg daily. Pt's last Bone Density Scan was 09/20/14.    Review of Systems  Constitutional: Negative for fatigue.  HENT: Negative.   Eyes: Negative.   Respiratory: Negative.  Negative for shortness of breath.   Cardiovascular: Negative.  Negative for palpitations.  Gastrointestinal: Negative.   Endocrine: Negative.   Genitourinary: Negative.   Musculoskeletal: Positive for arthritis. Negative for myalgias.  Neurological: Negative.  Negative for headaches.  Hematological: Negative.   Psychiatric/Behavioral: Negative for decreased concentration and suicidal ideas. The patient is not nervous/anxious and does not have insomnia.   All other systems reviewed and are negative.      Objective:   Physical Exam  Constitutional: She is oriented to person, place, and time. She appears well-developed and well-nourished. No distress.  HENT:  Head: Normocephalic and atraumatic.  Right Ear: External ear normal.  Left Ear: External ear normal.  Nose: Nose normal.  Mouth/Throat: Oropharynx is clear and moist.  Eyes: Pupils are equal, round, and reactive to light.  Neck: Normal range of motion. Neck supple. No thyromegaly present.  Cardiovascular: Normal rate, regular rhythm, normal heart sounds and intact distal pulses.   No murmur heard. Pulmonary/Chest:  Effort normal and breath sounds normal. No respiratory distress. She has no wheezes.  Abdominal: Soft.  Bowel sounds are normal. She exhibits no distension. There is no tenderness.  Musculoskeletal: Normal range of motion. She exhibits no edema or tenderness.  Decreased ROM of right hip with extension or flexion related to pain.   Neurological: She is alert and oriented to person, place, and time.  Skin: Skin is warm and dry.  Psychiatric: She has a normal mood and affect. Her behavior is normal. Judgment and thought content normal.  Vitals reviewed.   BP 130/68   Pulse (!) 56   Temp (!) 96.6 F (35.9 C) (Oral)   Ht 5' 2.5" (1.588 m)   Wt 153 lb 6.4 oz (69.6 kg)   SpO2 98%   BMI 27.61 kg/m        Assessment & Plan:  1. Essential hypertension - CMP14+EGFR  2. Osteoarthritis of multiple joints, unspecified osteoarthritis type - CMP14+EGFR  3. Osteopenia - CMP14+EGFR - VITAMIN D 25 Hydroxy (Vit-D Deficiency, Fractures)  4. GAD (generalized anxiety disorder) - CMP14+EGFR  5. Hyperlipidemia - CMP14+EGFR - Lipid panel  6. Vitamin D deficiency - CMP14+EGFR - VITAMIN D 25 Hydroxy (Vit-D Deficiency, Fractures)  7. Colon cancer screening - CMP14+EGFR - Fecal occult blood, imunochemical; Future   Continue all meds Labs pending Health Maintenance reviewed Diet and exercise encouraged RTO 6 months  Evelina Dun, FNP

## 2016-01-17 NOTE — Patient Instructions (Signed)
Health Maintenance, Female Adopting a healthy lifestyle and getting preventive care can go a long way to promote health and wellness. Talk with your health care provider about what schedule of regular examinations is right for you. This is a good chance for you to check in with your provider about disease prevention and staying healthy. In between checkups, there are plenty of things you can do on your own. Experts have done a lot of research about which lifestyle changes and preventive measures are most likely to keep you healthy. Ask your health care provider for more information. WEIGHT AND DIET  Eat a healthy diet  Be sure to include plenty of vegetables, fruits, low-fat dairy products, and lean protein.  Do not eat a lot of foods high in solid fats, added sugars, or salt.  Get regular exercise. This is one of the most important things you can do for your health.  Most adults should exercise for at least 150 minutes each week. The exercise should increase your heart rate and make you sweat (moderate-intensity exercise).  Most adults should also do strengthening exercises at least twice a week. This is in addition to the moderate-intensity exercise.  Maintain a healthy weight  Body mass index (BMI) is a measurement that can be used to identify possible weight problems. It estimates body fat based on height and weight. Your health care provider can help determine your BMI and help you achieve or maintain a healthy weight.  For females 20 years of age and older:   A BMI below 18.5 is considered underweight.  A BMI of 18.5 to 24.9 is normal.  A BMI of 25 to 29.9 is considered overweight.  A BMI of 30 and above is considered obese.  Watch levels of cholesterol and blood lipids  You should start having your blood tested for lipids and cholesterol at 75 years of age, then have this test every 5 years.  You may need to have your cholesterol levels checked more often if:  Your lipid  or cholesterol levels are high.  You are older than 75 years of age.  You are at high risk for heart disease.  CANCER SCREENING   Lung Cancer  Lung cancer screening is recommended for adults 55-80 years old who are at high risk for lung cancer because of a history of smoking.  A yearly low-dose CT scan of the lungs is recommended for people who:  Currently smoke.  Have quit within the past 15 years.  Have at least a 30-pack-year history of smoking. A pack year is smoking an average of one pack of cigarettes a day for 1 year.  Yearly screening should continue until it has been 15 years since you quit.  Yearly screening should stop if you develop a health problem that would prevent you from having lung cancer treatment.  Breast Cancer  Practice breast self-awareness. This means understanding how your breasts normally appear and feel.  It also means doing regular breast self-exams. Let your health care provider know about any changes, no matter how small.  If you are in your 20s or 30s, you should have a clinical breast exam (CBE) by a health care provider every 1-3 years as part of a regular health exam.  If you are 40 or older, have a CBE every year. Also consider having a breast X-ray (mammogram) every year.  If you have a family history of breast cancer, talk to your health care provider about genetic screening.  If you   are at high risk for breast cancer, talk to your health care provider about having an MRI and a mammogram every year.  Breast cancer gene (BRCA) assessment is recommended for women who have family members with BRCA-related cancers. BRCA-related cancers include:  Breast.  Ovarian.  Tubal.  Peritoneal cancers.  Results of the assessment will determine the need for genetic counseling and BRCA1 and BRCA2 testing. Cervical Cancer Your health care provider may recommend that you be screened regularly for cancer of the pelvic organs (ovaries, uterus, and  vagina). This screening involves a pelvic examination, including checking for microscopic changes to the surface of your cervix (Pap test). You may be encouraged to have this screening done every 3 years, beginning at age 21.  For women ages 30-65, health care providers may recommend pelvic exams and Pap testing every 3 years, or they may recommend the Pap and pelvic exam, combined with testing for human papilloma virus (HPV), every 5 years. Some types of HPV increase your risk of cervical cancer. Testing for HPV may also be done on women of any age with unclear Pap test results.  Other health care providers may not recommend any screening for nonpregnant women who are considered low risk for pelvic cancer and who do not have symptoms. Ask your health care provider if a screening pelvic exam is right for you.  If you have had past treatment for cervical cancer or a condition that could lead to cancer, you need Pap tests and screening for cancer for at least 20 years after your treatment. If Pap tests have been discontinued, your risk factors (such as having a new sexual partner) need to be reassessed to determine if screening should resume. Some women have medical problems that increase the chance of getting cervical cancer. In these cases, your health care provider may recommend more frequent screening and Pap tests. Colorectal Cancer  This type of cancer can be detected and often prevented.  Routine colorectal cancer screening usually begins at 75 years of age and continues through 75 years of age.  Your health care provider may recommend screening at an earlier age if you have risk factors for colon cancer.  Your health care provider may also recommend using home test kits to check for hidden blood in the stool.  A small camera at the end of a tube can be used to examine your colon directly (sigmoidoscopy or colonoscopy). This is done to check for the earliest forms of colorectal  cancer.  Routine screening usually begins at age 50.  Direct examination of the colon should be repeated every 5-10 years through 75 years of age. However, you may need to be screened more often if early forms of precancerous polyps or small growths are found. Skin Cancer  Check your skin from head to toe regularly.  Tell your health care provider about any new moles or changes in moles, especially if there is a change in a mole's shape or color.  Also tell your health care provider if you have a mole that is larger than the size of a pencil eraser.  Always use sunscreen. Apply sunscreen liberally and repeatedly throughout the day.  Protect yourself by wearing long sleeves, pants, a wide-brimmed hat, and sunglasses whenever you are outside. HEART DISEASE, DIABETES, AND HIGH BLOOD PRESSURE   High blood pressure causes heart disease and increases the risk of stroke. High blood pressure is more likely to develop in:  People who have blood pressure in the high end   of the normal range (130-139/85-89 mm Hg).  People who are overweight or obese.  People who are African American.  If you are 61-6 years of age, have your blood pressure checked every 3-5 years. If you are 29 years of age or older, have your blood pressure checked every year. You should have your blood pressure measured twice--once when you are at a hospital or clinic, and once when you are not at a hospital or clinic. Record the average of the two measurements. To check your blood pressure when you are not at a hospital or clinic, you can use:  An automated blood pressure machine at a pharmacy.  A home blood pressure monitor.  If you are between 67 years and 50 years old, ask your health care provider if you should take aspirin to prevent strokes.  Have regular diabetes screenings. This involves taking a blood sample to check your fasting blood sugar level.  If you are at a normal weight and have a low risk for diabetes,  have this test once every three years after 75 years of age.  If you are overweight and have a high risk for diabetes, consider being tested at a younger age or more often. PREVENTING INFECTION  Hepatitis B  If you have a higher risk for hepatitis B, you should be screened for this virus. You are considered at high risk for hepatitis B if:  You were born in a country where hepatitis B is common. Ask your health care provider which countries are considered high risk.  Your parents were born in a high-risk country, and you have not been immunized against hepatitis B (hepatitis B vaccine).  You have HIV or AIDS.  You use needles to inject street drugs.  You live with someone who has hepatitis B.  You have had sex with someone who has hepatitis B.  You get hemodialysis treatment.  You take certain medicines for conditions, including cancer, organ transplantation, and autoimmune conditions. Hepatitis C  Blood testing is recommended for:  Everyone born from [birth year] through 1965.  Anyone with known risk factors for hepatitis C. Sexually transmitted infections (STIs)  You should be screened for sexually transmitted infections (STIs) including gonorrhea and chlamydia if:  You are sexually active and are younger than 75 years of age.  You are older than 75 years of age and your health care provider tells you that you are at risk for this type of infection.  Your sexual activity has changed since you were last screened and you are at an increased risk for chlamydia or gonorrhea. Ask your health care provider if you are at risk.  If you do not have HIV, but are at risk, it may be recommended that you take a prescription medicine daily to prevent HIV infection. This is called pre-exposure prophylaxis (PrEP). You are considered at risk if:  You are sexually active and do not regularly use condoms or know the HIV status of your partner(s).  You take drugs by injection.  You are sexually  active with a partner who has HIV. Talk with your health care provider about whether you are at high risk of being infected with HIV. If you choose to begin PrEP, you should first be tested for HIV. You should then be tested every 3 months for as long as you are taking PrEP.  PREGNANCY   If you are premenopausal and you may become pregnant, ask your health care provider about preconception counseling.  If you may  become pregnant, take 400 to 800 micrograms (mcg) of folic acid every day.  If you want to prevent pregnancy, talk to your health care provider about birth control (contraception). OSTEOPOROSIS AND MENOPAUSE   Osteoporosis is a disease in which the bones lose minerals and strength with aging. This can result in serious bone fractures. Your risk for osteoporosis can be identified using a bone density scan.  If you are 47 years of age or older, or if you are at risk for osteoporosis and fractures, ask your health care provider if you should be screened.  Ask your health care provider whether you should take a calcium or vitamin D supplement to lower your risk for osteoporosis.  Menopause may have certain physical symptoms and risks.  Hormone replacement therapy may reduce some of these symptoms and risks. Talk to your health care provider about whether hormone replacement therapy is right for you.  HOME CARE INSTRUCTIONS   Schedule regular health, dental, and eye exams.  Stay current with your immunizations.   Do not use any tobacco products including cigarettes, chewing tobacco, or electronic cigarettes.  If you are pregnant, do not drink alcohol.  If you are breastfeeding, limit how much and how often you drink alcohol.  Limit alcohol intake to no more than 1 drink per day for nonpregnant women. One drink equals 12 ounces of beer, 5 ounces of wine, or 1 ounces of hard liquor.  Do not use street drugs.  Do not share needles.  Ask your health care provider for help if  you need support or information about quitting drugs.  Tell your health care provider if you often feel depressed.  Tell your health care provider if you have ever been abused or do not feel safe at home.   This information is not intended to replace advice given to you by your health care provider. Make sure you discuss any questions you have with your health care provider.   Document Released: 12/02/2010 Document Revised: 06/09/2014 Document Reviewed: 04/20/2013 Elsevier Interactive Patient Education [employer].

## 2016-01-18 LAB — CMP14+EGFR
A/G RATIO: 1.7 (ref 1.2–2.2)
ALBUMIN: 3.9 g/dL (ref 3.5–4.8)
ALT: 24 IU/L (ref 0–32)
AST: 20 IU/L (ref 0–40)
Alkaline Phosphatase: 38 IU/L — ABNORMAL LOW (ref 39–117)
BUN/Creatinine Ratio: 24 (ref 12–28)
BUN: 18 mg/dL (ref 8–27)
Bilirubin Total: 0.6 mg/dL (ref 0.0–1.2)
CALCIUM: 9.1 mg/dL (ref 8.7–10.3)
CO2: 23 mmol/L (ref 18–29)
Chloride: 102 mmol/L (ref 96–106)
Creatinine, Ser: 0.75 mg/dL (ref 0.57–1.00)
GFR, EST AFRICAN AMERICAN: 90 mL/min/{1.73_m2} (ref >59)
GFR, EST NON AFRICAN AMERICAN: 78 mL/min/{1.73_m2} (ref >59)
GLOBULIN, TOTAL: 2.3 g/dL (ref 1.5–4.5)
Glucose: 93 mg/dL (ref 65–99)
Potassium: 3.6 mmol/L (ref 3.5–5.2)
SODIUM: 142 mmol/L (ref 134–144)
TOTAL PROTEIN: 6.2 g/dL (ref 6.0–8.5)

## 2016-01-18 LAB — LIPID PANEL
CHOL/HDL RATIO: 3 ratio (ref 0.0–4.4)
Cholesterol, Total: 151 mg/dL (ref 100–199)
HDL: 50 mg/dL (ref >39)
LDL Calculated: 79 mg/dL (ref 0–99)
Triglycerides: 109 mg/dL (ref 0–149)
VLDL Cholesterol Cal: 22 mg/dL (ref 5–40)

## 2016-01-18 LAB — VITAMIN D 25 HYDROXY (VIT D DEFICIENCY, FRACTURES): VIT D 25 HYDROXY: 37.1 ng/mL (ref 30.0–100.0)

## 2016-01-22 ENCOUNTER — Ambulatory Visit (INDEPENDENT_AMBULATORY_CARE_PROVIDER_SITE_OTHER): Payer: Medicare Other | Admitting: Family

## 2016-01-22 ENCOUNTER — Other Ambulatory Visit: Payer: Self-pay | Admitting: Family

## 2016-01-22 ENCOUNTER — Encounter: Payer: Self-pay | Admitting: Family

## 2016-01-22 ENCOUNTER — Ambulatory Visit (INDEPENDENT_AMBULATORY_CARE_PROVIDER_SITE_OTHER): Payer: Medicare Other

## 2016-01-22 VITALS — BP 134/70 | HR 58 | Temp 96.8°F | Ht 62.5 in | Wt 155.0 lb

## 2016-01-22 DIAGNOSIS — Z1211 Encounter for screening for malignant neoplasm of colon: Secondary | ICD-10-CM

## 2016-01-22 DIAGNOSIS — E785 Hyperlipidemia, unspecified: Secondary | ICD-10-CM

## 2016-01-22 DIAGNOSIS — Z01818 Encounter for other preprocedural examination: Secondary | ICD-10-CM

## 2016-01-22 DIAGNOSIS — M8580 Other specified disorders of bone density and structure, unspecified site: Secondary | ICD-10-CM | POA: Diagnosis not present

## 2016-01-22 DIAGNOSIS — Z01811 Encounter for preprocedural respiratory examination: Secondary | ICD-10-CM | POA: Diagnosis not present

## 2016-01-22 DIAGNOSIS — I10 Essential (primary) hypertension: Secondary | ICD-10-CM | POA: Diagnosis not present

## 2016-01-22 DIAGNOSIS — I700 Atherosclerosis of aorta: Secondary | ICD-10-CM | POA: Insufficient documentation

## 2016-01-22 MED ORDER — EZETIMIBE 10 MG PO TABS: 10.0000 mg | ORAL_TABLET | Freq: Every day | ORAL | 3 refills | Status: DC

## 2016-01-22 MED ORDER — RALOXIFENE HCL 60 MG PO TABS: 60.0000 mg | ORAL_TABLET | Freq: Every day | ORAL | 3 refills | Status: DC

## 2016-01-22 MED ORDER — LOSARTAN POTASSIUM-HCTZ 100-25 MG PO TABS: 1.0000 | ORAL_TABLET | Freq: Every day | ORAL | 3 refills | Status: DC

## 2016-01-22 NOTE — Patient Instructions (Signed)
Total Hip Replacement Total hip replacement is a surgical procedure to remove damaged bone in your hip joint and replace it with an artificial hip joint (prosthetic hip joint). The purpose of this surgery is to reduce pain and to improve your hip function.  During a total hip replacement, one or both parts of the hip joint are replaced, depending on the type of joint damage you have. The hip is a ball-and-socket type of joint, and it has two main parts. The ball part of the joint (femoral head) is the top of the thigh bone (femur). The socket part of the joint is a large indent in the side of your pelvis (acetabulum) where the femur and pelvis meet. LET YOUR HEALTH CARE PROVIDER KNOW ABOUT:  Any allergies you have.  All medicines you are taking, including vitamins, herbs, eye drops, creams, and over-the-counter medicines.  Previous problems you or members of your family have had with the use of anesthetics.  Any blood disorders you have.  Previous surgeries you have had.  Medical conditions you have. RISKS AND COMPLICATIONS  Generally, total hip replacement is a safe procedure. However, problems can occur, including:  Infection.  Dislocation (the ball of the hip-joint prosthesis comes out of contact with the socket).  Loosening of the piece (stem) that connects the prosthetic femoral head to the femur.  Fracture of the bone while inserting the prosthesis.  Formation of blood clots, which can break loose and travel to and injure your lungs (pulmonary embolus). BEFORE THE PROCEDURE   Plan to have someone take you home after the procedure.  Do not eat or drink anything after midnight on the night before the procedure or as directed by your health care provider.  Ask your health care provider about:  Changing or stopping your regular medicines. This is especially important if you are taking diabetes medicines or blood thinners.  Taking medicines such as aspirin and ibuprofen. These  medicines can thin your blood. Do not take these medicines before your procedure if your health care provider asks you not to.  Ask your health care provider about how your surgical site will be marked or identified.  You may be given antibiotic medicines to help prevent infection. PROCEDURE   To reduce your risk of infection:  Your health care team will wash or sanitize their hands.  Your skin will be washed with soap.  An IV tube will be inserted into one of your veins. You will be given one or more of the following:  A medicine that makes you drowsy (sedative).  A medicine that makes you fall asleep (general anesthetic).  A medicine injected into your spine that numbs your body below the waist (spinal anesthetic).  An incision will be made in your hip. Your surgeon will take out any damaged cartilage and bone.  Your surgeon will then:  Insert a prosthetic socket into the acetabulum of your pelvis. This is usually secured with screws.  Remove the femoral head and replace it with a prosthetic ball and stem secured into the top of your femur.  Place the ball into the socket and check the range of motion and stability of your new hip.  Close the incision and apply a bandage over the surgical site. AFTER THE PROCEDURE   You will stay in a recovery area until the medicines have worn off.  Your vital signs, such as your pulse and blood pressure, will be monitored.  Once you are awake and stable, you will   be taken to a hospital room.  You may be directed to take actions to help prevent blood clots. These may include:  Walking soon after surgery, with someone assisting you. Moving around after surgery helps to improve blood flow.  Taking medicines to thin your blood (anticoagulants).  Wearing compression stockings or using different types of devices.  You will receive physical therapy until you are doing well and your health care provider feels it is safe for you to go  home.   This information is not intended to replace advice given to you by your health care provider. Make sure you discuss any questions you have with your health care provider.   Document Released: 08/25/2000 Document Revised: 02/07/2015 Document Reviewed: 07/20/2013 Elsevier Interactive Patient Education 2016 [employer] Inc[employer]  

## 2016-01-22 NOTE — Addendum Note (Signed)
Addended by: Liliane Bade on: 01/22/2016 10:03 AM   Modules accepted: Orders

## 2016-01-22 NOTE — Progress Notes (Signed)
   Subjective:    Patient ID: Andrea Moreno, female    DOB: 21-Jan-1941, 75 y.o.   MRN: [id number]  HPI Pt presents to the office today for surgical clearance for a right total hip replacement that is scheduled for 02/26/16. Pt states she is having intermittent pain in the right hip of 5 out 10. Pt denies any headache, palpitations, SOB, or edema at this time. Pt had a chronic follow up for HTN, hyperlipemia, osteopenia, on 01/17/16 that was stable and had WNL blood work.      Review of Systems  Constitutional: Negative.   HENT: Negative.   Eyes: Negative.   Respiratory: Negative.  Negative for shortness of breath.   Cardiovascular: Negative.  Negative for palpitations.  Gastrointestinal: Negative.   Endocrine: Negative.   Genitourinary: Negative.   Musculoskeletal: Positive for arthralgias.  Neurological: Negative.  Negative for headaches.  Hematological: Negative.   Psychiatric/Behavioral: Negative.   All other systems reviewed and are negative.      Objective:   Physical Exam  Constitutional: She is oriented to person, place, and time. She appears well-developed and well-nourished. No distress.  HENT:  Head: Normocephalic and atraumatic.  Right Ear: External ear normal.  Left Ear: External ear normal.  Nose: Nose normal.  Mouth/Throat: Oropharynx is clear and moist.  Eyes: Pupils are equal, round, and reactive to light.  Neck: Normal range of motion. Neck supple. No thyromegaly present.  Cardiovascular: Normal rate, regular rhythm, normal heart sounds and intact distal pulses.   No murmur heard. Pulmonary/Chest: Effort normal and breath sounds normal. No respiratory distress. She has no wheezes.  Abdominal: Soft. Bowel sounds are normal. She exhibits no distension. There is no tenderness.  Musculoskeletal: Normal range of motion. She exhibits no edema or tenderness.  Pain with flexion of right hip/leg, Full ROM    Neurological: She is alert and oriented to person,  place, and time.  Skin: Skin is warm and dry.  Psychiatric: She has a normal mood and affect. Her behavior is normal. Judgment and thought content normal.  Vitals reviewed.  Chest x-ray- WNL Preliminary reading by Evelina Dun, FNP [facility] EKG- Sinus  Rhythm    BP 134/70   Pulse (!) 58   Temp (!) 96.8 F (36 C) (Oral)   Ht 5' 2.5" (1.588 m)   Wt 155 lb (70.3 kg)   SpO2 99%   BMI 27.90 kg/m      Assessment & Plan:  1. Pre-op chest exam - DG Chest 2 View; Future - CBC with Differential/Platelet  2. Pre-op examination - EKG 12-Lead - CBC with Differential/Platelet  3. Osteopenia - raloxifene (EVISTA) 60 MG tablet; Take 1 tablet (60 mg total) by mouth daily.  Dispense: 90 tablet; Refill: 3  4. Hyperlipidemia - ezetimibe (ZETIA) 10 MG tablet; Take 1 tablet (10 mg total) by mouth daily.  Dispense: 90 tablet; Refill: 3  5. Essential hypertension - losartan-hydrochlorothiazide (HYZAAR) 100-25 MG tablet; Take 1 tablet by mouth daily.  Dispense: 90 tablet; Refill: 3 - CBC with Differential/Platelet   Continue all meds Labs pending Health Maintenance reviewed Diet and exercise encouraged RTO as needed and keep chronic follow up.   Evelina Dun, FNP

## 2016-01-23 LAB — CBC WITH DIFFERENTIAL/PLATELET
Basophils Absolute: 0.1 10*3/uL (ref 0.0–0.2)
Basos: 1 %
EOS (ABSOLUTE): 0.2 10*3/uL (ref 0.0–0.4)
Eos: 5 %
Hematocrit: 37.5 % (ref 34.0–46.6)
Hemoglobin: 12.3 g/dL (ref 11.1–15.9)
Immature Grans (Abs): 0 10*3/uL (ref 0.0–0.1)
Immature Granulocytes: 0 %
Lymphocytes Absolute: 1.2 10*3/uL (ref 0.7–3.1)
Lymphs: 31 %
MCH: 30.2 pg (ref 26.6–33.0)
MCHC: 32.8 g/dL (ref 31.5–35.7)
MCV: 92 fL (ref 79–97)
Monocytes Absolute: 0.3 10*3/uL (ref 0.1–0.9)
Monocytes: 8 %
Neutrophils Absolute: 2.1 10*3/uL (ref 1.4–7.0)
Neutrophils: 55 %
Platelets: 211 10*3/uL (ref 150–379)
RBC: 4.07 x10E6/uL (ref 3.77–5.28)
RDW: 13.4 % (ref 12.3–15.4)
WBC: 3.8 10*3/uL (ref 3.4–10.8)

## 2016-01-23 LAB — PLEASE NOTE

## 2016-01-25 LAB — FECAL OCCULT BLOOD, IMMUNOCHEMICAL: Fecal Occult Bld: NEGATIVE

## 2016-01-28 DIAGNOSIS — M1611 Unilateral primary osteoarthritis, right hip: Secondary | ICD-10-CM | POA: Diagnosis not present

## 2016-01-28 NOTE — H&P (Signed)
PREOPERATIVE H&P Patient ID: SHAMIR KLIM MRN: [id number] DOB/AGE: 75/25/1942 75 y.o.  Chief Complaint: OA RIGHT HIP  Planned Procedure Date: 02/26/16 Medical and Cardiac Clearance by Evelina Dun FNP    HPI: Andrea Moreno is a 75 y.o. female with a history of lumbar surgery, HTN, GAD, HLD, and TIA 10/14/12 w/o residual effect who presents for evaluation of OA RIGHT HIP. The patient has a history of pain and functional disability in the right hip due to arthritis and has failed non-surgical conservative treatments for greater than 12 weeks to include NSAID's and/or analgesics, corticosteriod injections and activity modification.  She is very active.  This limits her ADLs.  She feels the pain daily. Injections had provided relief, but no longer do.  Onset of symptoms was gradual, starting 3 years ago with gradually worsening course since that time.  Patient currently rates pain at 8 out of 10 with activity. Patient has worsening of pain with activity and weight bearing, pain that interferes with activities of daily living and pain with passive range of motion.  Patient has evidence of severe osteoarthritis of the right hip with joint space narrowing by imaging studies. There is no active infection.  Past Medical History:  Diagnosis Date  . Allergy   . Arthritis    "back, right shoulder, hips" (10/14/2012)  . Cataract   . Complication of anesthesia    "1950's had appendix out; kicked the RN; no problems since" (10/14/2012)  . Heart murmur   . Hyperlipidemia   . Hypertension   . Osteopenia   . Pneumonia    "couple of times; last time in the mid 1990's" (10/14/2012)  . TIA (transient ischemic attack) 10/14/2012   Past Surgical History:  Procedure Laterality Date  . APPENDECTOMY  1950's  . BACK SURGERY    . DILATION AND CURETTAGE OF UTERUS  ~ 1975  . [place]   "had a disc removed" (10/14/2012)  . TONSILLECTOMY  1940's  . TUBAL LIGATION  1960's   Allergies   Allergen Reactions  . Celebrex [Celecoxib]     rash  . Demerol [Meperidine] Hypertension  . Statins     Muscle aches   Prior to Admission medications   Medication Sig Start Date End Date Taking? Authorizing Provider  ALPRAZolam Duanne Moron) 0.25 MG tablet TAKE 1 TABLET AT BEDTIME AS NEEDED FOR ANXIETY 10/23/15   Sharion Balloon, FNP  aspirin EC 325 MG EC tablet Take 1 tablet (325 mg total) by mouth daily. 10/15/12   Charlynne Cousins, MD  calcium carbonate (TUMS - DOSED IN MG ELEMENTAL CALCIUM) 500 MG chewable tablet Chew 1 tablet by mouth daily.    Historical Provider, MD  cetirizine (ZYRTEC) 10 MG tablet Take 10 mg by mouth daily.    Historical Provider, MD  cholecalciferol (VITAMIN D) 400 UNITS TABS Take 400 Units by mouth 2 (two) times daily.     Historical Provider, MD  cyclobenzaprine (FLEXERIL) 5 MG tablet Take 1 tablet (5 mg total) by mouth 2 (two) times daily as needed. Patient taking differently: Take 5 mg by mouth daily.  01/16/14   Sharion Balloon, FNP  ezetimibe (ZETIA) 10 MG tablet Take 1 tablet (10 mg total) by mouth daily. 01/22/16   Sharion Balloon, FNP  fish oil-omega-3 fatty acids 1000 MG capsule Take 1 g by mouth 2 (two) times daily.     Historical Provider, MD  losartan-hydrochlorothiazide (HYZAAR) 100-25 MG tablet Take 1 tablet by mouth daily.  01/22/16   Sharion Balloon, FNP  Multiple Vitamin (MULTIVITAMIN) capsule Take 1 capsule by mouth daily.    Historical Provider, MD  OVER THE COUNTER MEDICATION Take 1 capsule by mouth 3 (three) times daily. "Replenex"    Historical Provider, MD  Potassium 99 MG TABS Take 99 mg by mouth daily.    Historical Provider, MD  raloxifene (EVISTA) 60 MG tablet Take 1 tablet (60 mg total) by mouth daily. 01/22/16   Sharion Balloon, FNP   Social History   Social History  . Marital status: Married    Spouse name: N/A  . Number of children: N/A  . Years of education: N/A   Social History Main Topics  . Smoking status: Former Smoker     Packs/day: 0.30    Years: 5.00    Types: Cigarettes    Quit date: 08/31/1985  . Smokeless tobacco: Never Used  . Alcohol use 0.6 oz/week    1 Cans of beer per week     Comment: 10/14/2012 "~ 6 oz wine/day"  . Drug use: No  . Sexual activity: No   Other Topics Concern  . Not on file   Social History Narrative  . No narrative on file   Family History  Problem Relation Age of Onset  . Heart disease Mother   . Diabetes Mother   . Arthritis Mother   . Vascular Disease Mother   . Diabetes Sister   . Hypertension Sister   . Early death Brother   . Vascular Disease Brother   . Arthritis Father   . Diabetes Sister   . Hypertension Sister     ROS: Currently denies lightheadedness, dizziness, Fever, chills, CP, SOB.   No personal history of DVT, PE, MI.  TIA w/o residual effects 2014. No loose teeth or dentures All other systems have been reviewed and were otherwise currently negative with the exception of those mentioned in the HPI and as above.  Objective: Vitals: Ht: 5'2 1/2" Wt: 156 BP: 137/53 Pulse: 62  Physical Exam: General: Alert, NAD.  Trendelenberg Gait  HEENT: EOMI, Good Neck Extension  Pulm: No increased work of breathing.  Clear B/L A/P w/o crackle or wheeze. CV: RRR, No m/g/r appreciated GI: soft, NT, ND Neuro: Neuro grossly intact b/l upper/lower ext.  Sensation intact distally Skin: No lesions in the area of chief complaint MSK/Surgical Site: Right Hip Non tender over greater trochanter.  Pain with passive ROM.  Positive Stinchfield.  5/5 strength.  NVI.  Sensation intact distally.  She has some residual weakness with plantar flexion of the left foot from hx of lumbar back surgery.  Imaging Review  Patient has evidence of severe osteoarthritis of the right hip with joint space narrowing by imaging studies.  Assessment: OA RIGHT HIP Principal Problem:   Primary osteoarthritis of right hip Active Problems:   History of TIA (transient ischemic attack)    Hyperlipidemia   HTN (hypertension)   Osteopenia   Vitamin D deficiency   GAD (generalized anxiety disorder)   Osteoarthritis   Aortic atherosclerosis (HCC)  Plan: Plan for Procedure(s): TOTAL HIP ARTHROPLASTY ANTERIOR APPROACH  The patient history, physical exam, clinical judgement of the provider and imaging are consistent with end stage degenerative joint disease and total joint arthroplasty is deemed medically necessary. The treatment options including medical management, injection therapy, and arthroplasty were discussed at length. The risks and benefits of Procedure(s): TOTAL HIP ARTHROPLASTY ANTERIOR APPROACH were presented and reviewed.  The risks of nonoperative treatment,  versus surgical intervention including but not limited to continued pain, aseptic loosening, stiffness, dislocation/subluxation, infection, bleeding, nerve injury, blood clots, cardiopulmonary complications, morbidity, mortality, among others were discussed. The patient verbalizes understanding and wishes to proceed with the plan.  Patient is being admitted for inpatient treatment for surgery, pain control, PT, OT, prophylactic antibiotics, VTE prophylaxis, progressive ambulation, ADL's and discharge planning.   Dental prophylaxis discussed and recommended for 2 years postoperatively.  The patient does meet the criteria for TXA which will be used perioperatively via IV.   Xarelto  will be used postoperatively for DVT prophylaxis dt history of TIA in addition to SCDs, and early ambulation. The patient is planning to be discharged home with home health services with [place] in care of her spouse/partner. No Celebrex - rash.  Other NSAIDs okay when not also taking ASA. Demerol - patient believes this caused significant hypotension remotely - no significant pain medication use history.  Charna Elizabeth Martensen III,PA-C 01/28/2016 1:54 PM

## 2016-02-14 NOTE — Pre-Procedure Instructions (Signed)
VAYDA FITTS  02/14/2016      [place], [place] [address] Phone: [phone] Fax: [phone]    Your procedure is scheduled on Tuesday, September 26.  Report to [facility] Admitting at 10:00 A.M.  Call this number if you have problems the morning of surgery:  [phone]   Remember:  Do not eat food or drink liquids after midnight.  Take these medicines the morning of surgery with A SIP OF WATER: Flexeril if needed  7 days prior to surgery STOP taking any Aspirin, Aleve, Naproxen, Ibuprofen, Motrin, Advil, Goody's, BC's, all herbal medications, fish oil, and all vitamins    Do not wear jewelry, make-up or nail polish.  Do not wear lotions, powders, or perfumes, or deoderant.  Do not shave 48 hours prior to surgery.    Do not bring valuables to the hospital.  [facility] is not responsible for any belongings or valuables.  Contacts, dentures or bridgework may not be worn into surgery.  Leave your suitcase in the car.  After surgery it may be brought to your room.  For patients admitted to the hospital, discharge time will be determined by your treatment team.  Patients discharged the day of surgery will not be allowed to drive home.   Special instructions:     [facility]- Preparing For Surgery  Before surgery, you can play an important role. Because skin is not sterile, your skin needs to be as free of germs as possible. You can reduce the number of germs on your skin by washing with CHG (chlorahexidine gluconate) Soap before surgery.  CHG is an antiseptic cleaner which kills germs and bonds with the skin to continue killing germs even after washing.  Please do not use if you have an allergy to CHG or antibacterial soaps. If your skin becomes reddened/irritated stop using the CHG.  Do not shave (including legs and underarms) for at least 48 hours prior to first CHG shower. It is OK  to shave your face.  Please follow these instructions carefully.   1. Shower the NIGHT BEFORE SURGERY and the MORNING OF SURGERY with CHG.   2. If you chose to wash your hair, wash your hair first as usual with your normal shampoo.  3. After you shampoo, rinse your hair and body thoroughly to remove the shampoo.  4. Use CHG as you would any other liquid soap. You can apply CHG directly to the skin and wash gently with a scrungie or a clean washcloth.   5. Apply the CHG Soap to your body ONLY FROM THE NECK DOWN.  Do not use on open wounds or open sores. Avoid contact with your eyes, ears, mouth and genitals (private parts). Wash genitals (private parts) with your normal soap.  6. Wash thoroughly, paying special attention to the area where your surgery will be performed.  7. Thoroughly rinse your body with warm water from the neck down.  8. DO NOT shower/wash with your normal soap after using and rinsing off the CHG Soap.  9. Pat yourself dry with a CLEAN TOWEL.   10. Wear CLEAN PAJAMAS   11. Place CLEAN SHEETS on your bed the night of your first shower and DO NOT SLEEP WITH PETS.    Day of Surgery: Do not apply any deodorants/lotions. Please wear clean clothes to the hospital/surgery center.      Please read over the following  fact sheets that you were given. MRSA Information

## 2016-02-15 ENCOUNTER — Encounter (HOSPITAL_COMMUNITY)
Admission: RE | Admit: 2016-02-15 | Discharge: 2016-02-15 | Disposition: A | Discharge status: Home / Self Care | Payer: Medicare Other | Source: Ambulatory Visit | Attending: Orthopedic Surgery | Admitting: Orthopedic Surgery

## 2016-02-15 ENCOUNTER — Encounter (HOSPITAL_COMMUNITY): Payer: Self-pay

## 2016-02-15 DIAGNOSIS — Z01818 Encounter for other preprocedural examination: Secondary | ICD-10-CM | POA: Diagnosis not present

## 2016-02-15 HISTORY — DX: Personal history of other diseases of the nervous system and sense organs: Z86.69

## 2016-02-15 LAB — BASIC METABOLIC PANEL
ANION GAP: 8 (ref 5–15)
BUN: 19 mg/dL (ref 6–20)
CHLORIDE: 106 mmol/L (ref 101–111)
CO2: 25 mmol/L (ref 22–32)
Calcium: 9.4 mg/dL (ref 8.9–10.3)
Creatinine, Ser: 0.78 mg/dL (ref 0.44–1.00)
GFR calc Af Amer: >60 mL/min (ref >60)
GLUCOSE: 106 mg/dL — AB (ref 65–99)
POTASSIUM: 3.2 mmol/L — AB (ref 3.5–5.1)
Sodium: 139 mmol/L (ref 135–145)

## 2016-02-15 LAB — CBC
HEMATOCRIT: 39.7 % (ref 36.0–46.0)
HEMOGLOBIN: 12.8 g/dL (ref 12.0–15.0)
MCH: 29.7 pg (ref 26.0–34.0)
MCHC: 32.2 g/dL (ref 30.0–36.0)
MCV: 92.1 fL (ref 78.0–100.0)
Platelets: 200 10*3/uL (ref 150–400)
RBC: 4.31 MIL/uL (ref 3.87–5.11)
RDW: 12.9 % (ref 11.5–15.5)
WBC: 4.1 10*3/uL (ref 4.0–10.5)

## 2016-02-15 LAB — SURGICAL PCR SCREEN
MRSA, PCR: NEGATIVE
STAPHYLOCOCCUS AUREUS: NEGATIVE

## 2016-02-15 LAB — URINALYSIS, ROUTINE W REFLEX MICROSCOPIC
BILIRUBIN URINE: NEGATIVE
GLUCOSE, UA: NEGATIVE mg/dL
HGB URINE DIPSTICK: NEGATIVE
KETONES UR: NEGATIVE mg/dL
Leukocytes, UA: NEGATIVE
Nitrite: NEGATIVE
PROTEIN: NEGATIVE mg/dL
Specific Gravity, Urine: 1.029 (ref 1.005–1.030)
pH: 5.5 (ref 5.0–8.0)

## 2016-02-15 LAB — PROTIME-INR
INR: 1
Prothrombin Time: 13.2 seconds (ref 11.4–15.2)

## 2016-02-15 LAB — TYPE AND SCREEN
ABO/RH(D): A NEG
Antibody Screen: NEGATIVE

## 2016-02-15 LAB — ABO/RH: ABO/RH(D): A NEG

## 2016-02-15 LAB — APTT: APTT: 29 s (ref 24–36)

## 2016-02-15 NOTE — Progress Notes (Signed)
PCP: Evelina Dun No cardiologist Echo: 10/06/15  EKG: 01/22/16 CXR: 01/22/16  No complaints of chest pain, SOB at this time.

## 2016-02-18 LAB — URINE CULTURE

## 2016-02-25 MED ORDER — TRANEXAMIC ACID 1000 MG/10ML IV SOLN
1000.0000 mg | INTRAVENOUS | Status: AC
  Administered 2016-02-26: 1000 mg via INTRAVENOUS
  Filled 2016-02-25: qty 10

## 2016-02-25 NOTE — Progress Notes (Signed)
Pt notified of new arrival time being 0700. Wasn't happy about new time but understands and states she will be here.

## 2016-02-26 ENCOUNTER — Inpatient Hospital Stay (HOSPITAL_COMMUNITY): Payer: Medicare Other | Admitting: Anesthesiology

## 2016-02-26 ENCOUNTER — Encounter (HOSPITAL_COMMUNITY)
Admission: RE | Disposition: A | Discharge status: Home / Self Care | Payer: Self-pay | Source: Ambulatory Visit | Attending: Orthopedic Surgery

## 2016-02-26 ENCOUNTER — Inpatient Hospital Stay (HOSPITAL_COMMUNITY)
Admission: RE | Admit: 2016-02-26 | Discharge: 2016-02-27 | DRG: 470 | Disposition: A | Discharge status: Home Health | Payer: Medicare Other | Source: Ambulatory Visit | Attending: Orthopedic Surgery | Admitting: Orthopedic Surgery

## 2016-02-26 ENCOUNTER — Encounter (HOSPITAL_COMMUNITY): Payer: Self-pay | Admitting: General Practice

## 2016-02-26 ENCOUNTER — Inpatient Hospital Stay (HOSPITAL_COMMUNITY): Payer: Medicare Other

## 2016-02-26 DIAGNOSIS — I700 Atherosclerosis of aorta: Secondary | ICD-10-CM | POA: Diagnosis present

## 2016-02-26 DIAGNOSIS — E785 Hyperlipidemia, unspecified: Secondary | ICD-10-CM | POA: Diagnosis present

## 2016-02-26 DIAGNOSIS — E559 Vitamin D deficiency, unspecified: Secondary | ICD-10-CM | POA: Diagnosis present

## 2016-02-26 DIAGNOSIS — Z7982 Long term (current) use of aspirin: Secondary | ICD-10-CM | POA: Diagnosis not present

## 2016-02-26 DIAGNOSIS — Z79899 Other long term (current) drug therapy: Secondary | ICD-10-CM | POA: Diagnosis not present

## 2016-02-26 DIAGNOSIS — Z8673 Personal history of transient ischemic attack (TIA), and cerebral infarction without residual deficits: Secondary | ICD-10-CM | POA: Diagnosis not present

## 2016-02-26 DIAGNOSIS — Z888 Allergy status to other drugs, medicaments and biological substances status: Secondary | ICD-10-CM

## 2016-02-26 DIAGNOSIS — Z833 Family history of diabetes mellitus: Secondary | ICD-10-CM

## 2016-02-26 DIAGNOSIS — I10 Essential (primary) hypertension: Secondary | ICD-10-CM | POA: Diagnosis not present

## 2016-02-26 DIAGNOSIS — F411 Generalized anxiety disorder: Secondary | ICD-10-CM | POA: Diagnosis not present

## 2016-02-26 DIAGNOSIS — M169 Osteoarthritis of hip, unspecified: Secondary | ICD-10-CM | POA: Diagnosis not present

## 2016-02-26 DIAGNOSIS — Z8249 Family history of ischemic heart disease and other diseases of the circulatory system: Secondary | ICD-10-CM

## 2016-02-26 DIAGNOSIS — M1990 Unspecified osteoarthritis, unspecified site: Secondary | ICD-10-CM | POA: Diagnosis present

## 2016-02-26 DIAGNOSIS — Z419 Encounter for procedure for purposes other than remedying health state, unspecified: Secondary | ICD-10-CM

## 2016-02-26 DIAGNOSIS — M8580 Other specified disorders of bone density and structure, unspecified site: Secondary | ICD-10-CM | POA: Diagnosis present

## 2016-02-26 DIAGNOSIS — Z87891 Personal history of nicotine dependence: Secondary | ICD-10-CM

## 2016-02-26 DIAGNOSIS — M1611 Unilateral primary osteoarthritis, right hip: Secondary | ICD-10-CM | POA: Diagnosis not present

## 2016-02-26 DIAGNOSIS — Z471 Aftercare following joint replacement surgery: Secondary | ICD-10-CM | POA: Diagnosis not present

## 2016-02-26 DIAGNOSIS — Z96641 Presence of right artificial hip joint: Secondary | ICD-10-CM | POA: Diagnosis not present

## 2016-02-26 HISTORY — PX: TOTAL HIP ARTHROPLASTY: SHX124

## 2016-02-26 LAB — CBC
HEMATOCRIT: 33.2 % — AB (ref 36.0–46.0)
HEMOGLOBIN: 10.7 g/dL — AB (ref 12.0–15.0)
MCH: 29.7 pg (ref 26.0–34.0)
MCHC: 32.2 g/dL (ref 30.0–36.0)
MCV: 92.2 fL (ref 78.0–100.0)
Platelets: 165 10*3/uL (ref 150–400)
RBC: 3.6 MIL/uL — ABNORMAL LOW (ref 3.87–5.11)
RDW: 12.9 % (ref 11.5–15.5)
WBC: 7.5 10*3/uL (ref 4.0–10.5)

## 2016-02-26 SURGERY — ARTHROPLASTY, HIP, TOTAL, ANTERIOR APPROACH
Anesthesia: Spinal | Site: Hip | Laterality: Right

## 2016-02-26 MED ORDER — LACTATED RINGERS IV SOLN
INTRAVENOUS | Status: DC | PRN
  Administered 2016-02-26 (×3): via INTRAVENOUS

## 2016-02-26 MED ORDER — HYDROMORPHONE HCL 1 MG/ML IJ SOLN: 0.2500 mg | INTRAMUSCULAR | Status: DC | PRN

## 2016-02-26 MED ORDER — MENTHOL 3 MG MT LOZG: 1.0000 | LOZENGE | OROMUCOSAL | Status: DC | PRN

## 2016-02-26 MED ORDER — RIVAROXABAN 10 MG PO TABS
10.0000 mg | ORAL_TABLET | Freq: Every day | ORAL | Status: DC
  Administered 2016-02-27: 10 mg via ORAL
  Filled 2016-02-26: qty 1

## 2016-02-26 MED ORDER — ALPRAZOLAM 0.25 MG PO TABS
0.2500 mg | ORAL_TABLET | Freq: Every evening | ORAL | Status: DC | PRN
Start: 2016-02-26 — End: 2016-02-27
  Administered 2016-02-26: 0.25 mg via ORAL
  Filled 2016-02-26: qty 1

## 2016-02-26 MED ORDER — HYDROCHLOROTHIAZIDE 25 MG PO TABS
25.0000 mg | ORAL_TABLET | Freq: Every day | ORAL | Status: DC
Start: 2016-02-26 — End: 2016-02-27
  Administered 2016-02-27: 25 mg via ORAL
  Filled 2016-02-26: qty 1

## 2016-02-26 MED ORDER — ALBUMIN HUMAN 5 % IV SOLN
INTRAVENOUS | Status: AC
  Filled 2016-02-26: qty 250

## 2016-02-26 MED ORDER — MORPHINE SULFATE (PF) 2 MG/ML IV SOLN: 2.0000 mg | INTRAVENOUS | Status: DC | PRN

## 2016-02-26 MED ORDER — DEXTROSE 5 % IV SOLN
500.0000 mg | Freq: Four times a day (QID) | INTRAVENOUS | Status: DC | PRN
  Filled 2016-02-26: qty 5

## 2016-02-26 MED ORDER — PROPOFOL 500 MG/50ML IV EMUL
INTRAVENOUS | Status: DC | PRN
  Administered 2016-02-26: 50 ug/kg/min via INTRAVENOUS

## 2016-02-26 MED ORDER — INFLUENZA VAC SPLIT QUAD 0.5 ML IM SUSY
0.5000 mL | PREFILLED_SYRINGE | INTRAMUSCULAR | Status: DC
  Filled 2016-02-26: qty 0.5

## 2016-02-26 MED ORDER — MEPERIDINE HCL 25 MG/ML IJ SOLN: 6.2500 mg | INTRAMUSCULAR | Status: DC | PRN

## 2016-02-26 MED ORDER — POTASSIUM CHLORIDE CRYS ER 10 MEQ PO TBCR
10.0000 meq | EXTENDED_RELEASE_TABLET | Freq: Every day | ORAL | Status: DC
  Filled 2016-02-26: qty 1

## 2016-02-26 MED ORDER — DIPHENHYDRAMINE HCL 12.5 MG/5ML PO ELIX: 12.5000 mg | ORAL_SOLUTION | ORAL | Status: DC | PRN

## 2016-02-26 MED ORDER — ACETAMINOPHEN 325 MG PO TABS: 650.0000 mg | ORAL_TABLET | Freq: Four times a day (QID) | ORAL | Status: DC | PRN

## 2016-02-26 MED ORDER — KETOROLAC TROMETHAMINE 30 MG/ML IJ SOLN
INTRAMUSCULAR | Status: DC | PRN
  Administered 2016-02-26: 30 mg

## 2016-02-26 MED ORDER — ONDANSETRON HCL 4 MG/2ML IJ SOLN: 4.0000 mg | Freq: Four times a day (QID) | INTRAMUSCULAR | Status: DC | PRN

## 2016-02-26 MED ORDER — CEFAZOLIN SODIUM-DEXTROSE 2-4 GM/100ML-% IV SOLN
2.0000 g | Freq: Four times a day (QID) | INTRAVENOUS | Status: AC
  Administered 2016-02-26 (×2): 2 g via INTRAVENOUS
  Filled 2016-02-26 (×2): qty 100

## 2016-02-26 MED ORDER — EZETIMIBE 10 MG PO TABS
10.0000 mg | ORAL_TABLET | Freq: Every day | ORAL | Status: DC
  Administered 2016-02-27: 10 mg via ORAL
  Filled 2016-02-26: qty 1

## 2016-02-26 MED ORDER — SODIUM CHLORIDE FLUSH 0.9 % IV SOLN
INTRAVENOUS | Status: DC | PRN
  Administered 2016-02-26: 30 mL

## 2016-02-26 MED ORDER — ACETAMINOPHEN 650 MG RE SUPP: 650.0000 mg | Freq: Four times a day (QID) | RECTAL | Status: DC | PRN

## 2016-02-26 MED ORDER — BUPIVACAINE IN DEXTROSE 0.75-8.25 % IT SOLN
INTRATHECAL | Status: DC | PRN
  Administered 2016-02-26: 12 mg via INTRATHECAL

## 2016-02-26 MED ORDER — METOCLOPRAMIDE HCL 5 MG/ML IJ SOLN: 5.0000 mg | Freq: Three times a day (TID) | INTRAMUSCULAR | Status: DC | PRN

## 2016-02-26 MED ORDER — METHOCARBAMOL 500 MG PO TABS: 500.0000 mg | ORAL_TABLET | Freq: Four times a day (QID) | ORAL | Status: DC | PRN

## 2016-02-26 MED ORDER — MIDAZOLAM HCL 2 MG/2ML IJ SOLN: 0.5000 mg | Freq: Once | INTRAMUSCULAR | Status: DC | PRN

## 2016-02-26 MED ORDER — PHENOL 1.4 % MT LIQD: 1.0000 | OROMUCOSAL | Status: DC | PRN

## 2016-02-26 MED ORDER — PROPOFOL 10 MG/ML IV BOLUS
INTRAVENOUS | Status: AC
  Filled 2016-02-26: qty 40

## 2016-02-26 MED ORDER — BUPIVACAINE-EPINEPHRINE (PF) 0.25% -1:200000 IJ SOLN
INTRAMUSCULAR | Status: AC
  Filled 2016-02-26: qty 30

## 2016-02-26 MED ORDER — ACETAMINOPHEN 325 MG PO TABS
650.0000 mg | ORAL_TABLET | Freq: Four times a day (QID) | ORAL | Status: AC
  Administered 2016-02-26 – 2016-02-27 (×4): 650 mg via ORAL
  Filled 2016-02-26 (×4): qty 2

## 2016-02-26 MED ORDER — SENNA 8.6 MG PO TABS
1.0000 | ORAL_TABLET | Freq: Two times a day (BID) | ORAL | Status: DC
  Administered 2016-02-26: 8.6 mg via ORAL
  Filled 2016-02-26 (×2): qty 1

## 2016-02-26 MED ORDER — LOSARTAN POTASSIUM-HCTZ 100-25 MG PO TABS: 1.0000 | ORAL_TABLET | Freq: Every day | ORAL | Status: DC

## 2016-02-26 MED ORDER — POTASSIUM 99 MG PO TABS: 99.0000 mg | ORAL_TABLET | Freq: Every day | ORAL | Status: DC

## 2016-02-26 MED ORDER — DEXAMETHASONE SODIUM PHOSPHATE 10 MG/ML IJ SOLN
10.0000 mg | Freq: Once | INTRAMUSCULAR | Status: DC
  Filled 2016-02-26: qty 1

## 2016-02-26 MED ORDER — SORBITOL 70 % SOLN: 30.0000 mL | Freq: Every day | Status: DC | PRN

## 2016-02-26 MED ORDER — LACTATED RINGERS IV SOLN
INTRAVENOUS | Status: DC
  Administered 2016-02-26: 08:00:00 via INTRAVENOUS

## 2016-02-26 MED ORDER — DEXTROSE-NACL 5-0.45 % IV SOLN
INTRAVENOUS | Status: DC
  Administered 2016-02-26: 22:00:00 via INTRAVENOUS

## 2016-02-26 MED ORDER — PHENYLEPHRINE HCL 10 MG/ML IJ SOLN
INTRAVENOUS | Status: DC | PRN
  Administered 2016-02-26: 25 ug/min via INTRAVENOUS

## 2016-02-26 MED ORDER — DOCUSATE SODIUM 100 MG PO CAPS
100.0000 mg | ORAL_CAPSULE | Freq: Two times a day (BID) | ORAL | Status: DC
  Administered 2016-02-26: 100 mg via ORAL
  Filled 2016-02-26 (×2): qty 1

## 2016-02-26 MED ORDER — HYDROCODONE-ACETAMINOPHEN 5-325 MG PO TABS
1.0000 | ORAL_TABLET | ORAL | Status: DC | PRN
  Administered 2016-02-26: 1 via ORAL
  Filled 2016-02-26: qty 1

## 2016-02-26 MED ORDER — BUPIVACAINE-EPINEPHRINE 0.25% -1:200000 IJ SOLN
INTRAMUSCULAR | Status: DC | PRN
  Administered 2016-02-26: 30 mL

## 2016-02-26 MED ORDER — FENTANYL CITRATE (PF) 100 MCG/2ML IJ SOLN
INTRAMUSCULAR | Status: DC | PRN
  Administered 2016-02-26: 100 ug via INTRAVENOUS

## 2016-02-26 MED ORDER — MIDAZOLAM HCL 2 MG/2ML IJ SOLN
INTRAMUSCULAR | Status: AC
  Filled 2016-02-26: qty 2

## 2016-02-26 MED ORDER — ALBUMIN HUMAN 5 % IV SOLN
INTRAVENOUS | Status: DC | PRN
  Administered 2016-02-26: 10:00:00 via INTRAVENOUS

## 2016-02-26 MED ORDER — TRANEXAMIC ACID 1000 MG/10ML IV SOLN
1000.0000 mg | Freq: Once | INTRAVENOUS | Status: AC
  Administered 2016-02-26: 1000 mg via INTRAVENOUS
  Filled 2016-02-26: qty 10

## 2016-02-26 MED ORDER — CEFAZOLIN SODIUM-DEXTROSE 2-4 GM/100ML-% IV SOLN
2.0000 g | INTRAVENOUS | Status: AC
  Administered 2016-02-26: 2 g via INTRAVENOUS
  Filled 2016-02-26: qty 100

## 2016-02-26 MED ORDER — FLEET ENEMA 7-19 GM/118ML RE ENEM: 1.0000 | ENEMA | Freq: Once | RECTAL | Status: DC | PRN

## 2016-02-26 MED ORDER — CHLORHEXIDINE GLUCONATE 4 % EX LIQD: 60.0000 mL | Freq: Once | CUTANEOUS | Status: DC

## 2016-02-26 MED ORDER — PROMETHAZINE HCL 25 MG/ML IJ SOLN: 6.2500 mg | INTRAMUSCULAR | Status: DC | PRN

## 2016-02-26 MED ORDER — METOCLOPRAMIDE HCL 5 MG PO TABS: 5.0000 mg | ORAL_TABLET | Freq: Three times a day (TID) | ORAL | Status: DC | PRN

## 2016-02-26 MED ORDER — LOSARTAN POTASSIUM 50 MG PO TABS
100.0000 mg | ORAL_TABLET | Freq: Every day | ORAL | Status: DC
  Administered 2016-02-27: 100 mg via ORAL
  Filled 2016-02-26: qty 2

## 2016-02-26 MED ORDER — POLYETHYLENE GLYCOL 3350 17 G PO PACK: 17.0000 g | PACK | Freq: Every day | ORAL | Status: DC | PRN

## 2016-02-26 MED ORDER — ONDANSETRON HCL 4 MG PO TABS: 4.0000 mg | ORAL_TABLET | Freq: Four times a day (QID) | ORAL | Status: DC | PRN

## 2016-02-26 MED ORDER — FENTANYL CITRATE (PF) 100 MCG/2ML IJ SOLN
INTRAMUSCULAR | Status: AC
  Filled 2016-02-26: qty 4

## 2016-02-26 MED ORDER — KETOROLAC TROMETHAMINE 30 MG/ML IJ SOLN
INTRAMUSCULAR | Status: AC
  Filled 2016-02-26: qty 1

## 2016-02-26 MED ORDER — MIDAZOLAM HCL 2 MG/2ML IJ SOLN
INTRAMUSCULAR | Status: DC | PRN
  Administered 2016-02-26: 2 mg via INTRAVENOUS

## 2016-02-26 MED ORDER — 0.9 % SODIUM CHLORIDE (POUR BTL) OPTIME
TOPICAL | Status: DC | PRN
  Administered 2016-02-26: 1000 mL

## 2016-02-26 MED ORDER — ALBUMIN HUMAN 5 % IV SOLN
12.5000 g | Freq: Once | INTRAVENOUS | Status: AC
  Administered 2016-02-26: 12.5 g via INTRAVENOUS

## 2016-02-26 MED ORDER — RALOXIFENE HCL 60 MG PO TABS
60.0000 mg | ORAL_TABLET | Freq: Every day | ORAL | Status: DC
  Administered 2016-02-27: 60 mg via ORAL
  Filled 2016-02-26 (×2): qty 1

## 2016-02-26 MED ORDER — ACETAMINOPHEN 500 MG PO TABS
1000.0000 mg | ORAL_TABLET | Freq: Once | ORAL | Status: AC
  Administered 2016-02-26: 1000 mg via ORAL
  Filled 2016-02-26: qty 2

## 2016-02-26 MED ORDER — CELECOXIB 200 MG PO CAPS
200.0000 mg | ORAL_CAPSULE | Freq: Two times a day (BID) | ORAL | Status: DC
  Filled 2016-02-26 (×2): qty 1

## 2016-02-26 SURGICAL SUPPLY — 59 items
BAG DECANTER FOR FLEXI CONT (MISCELLANEOUS) IMPLANT
BLADE SAG 18X100X1.27 (BLADE) IMPLANT
BLADE SAW SGTL 18X1.27X75 (BLADE) IMPLANT
BLADE SAW SGTL 18X1.27X75MM (BLADE)
BLADE SURG ROTATE 9660 (MISCELLANEOUS) IMPLANT
CAPT HIP TOTAL 2 ×2 IMPLANT
CATH ROBINSON RED A/P 16FR (CATHETERS) ×2 IMPLANT
CLOSURE STERI-STRIP 1/2X4 (GAUZE/BANDAGES/DRESSINGS) ×1
CLSR STERI-STRIP ANTIMIC 1/2X4 (GAUZE/BANDAGES/DRESSINGS) ×2 IMPLANT
COVER PERINEAL POST (MISCELLANEOUS) ×3 IMPLANT
COVER SURGICAL LIGHT HANDLE (MISCELLANEOUS) ×3 IMPLANT
DRAPE C-ARM 42X72 X-RAY (DRAPES) ×2 IMPLANT
DRAPE STERI IOBAN 125X83 (DRAPES) ×3 IMPLANT
DRAPE U-SHAPE 47X51 STRL (DRAPES) ×6 IMPLANT
DRSG MEPILEX BORDER 4X8 (GAUZE/BANDAGES/DRESSINGS) ×3 IMPLANT
DURAPREP 26ML APPLICATOR (WOUND CARE) ×3 IMPLANT
ELECT BLADE 4.0 EZ CLEAN MEGAD (MISCELLANEOUS) ×3
ELECT REM PT RETURN 9FT ADLT (ELECTROSURGICAL) ×3
ELECTRODE BLDE 4.0 EZ CLN MEGD (MISCELLANEOUS) ×1 IMPLANT
ELECTRODE REM PT RTRN 9FT ADLT (ELECTROSURGICAL) ×1 IMPLANT
FACESHIELD WRAPAROUND (MASK) ×3 IMPLANT
FACESHIELD WRAPAROUND OR TEAM (MASK) ×2 IMPLANT
GLOVE BIO SURGEON STRL SZ7.5 (GLOVE) ×6 IMPLANT
GLOVE BIOGEL PI IND STRL 8 (GLOVE) ×2 IMPLANT
GLOVE BIOGEL PI INDICATOR 8 (GLOVE) ×4
GOWN STRL REUS W/ TWL LRG LVL3 (GOWN DISPOSABLE) ×2 IMPLANT
GOWN STRL REUS W/ TWL XL LVL3 (GOWN DISPOSABLE) ×1 IMPLANT
GOWN STRL REUS W/TWL LRG LVL3 (GOWN DISPOSABLE) ×6
GOWN STRL REUS W/TWL XL LVL3 (GOWN DISPOSABLE) ×3
KIT BASIN OR (CUSTOM PROCEDURE TRAY) ×3 IMPLANT
KIT ROOM TURNOVER OR (KITS) ×3 IMPLANT
MANIFOLD NEPTUNE II (INSTRUMENTS) ×3 IMPLANT
NDL 18GX1X1/2 (RX/OR ONLY) (NEEDLE) IMPLANT
NDL SAFETY ECLIPSE 18X1.5 (NEEDLE) ×1 IMPLANT
NEEDLE 18GX1X1/2 (RX/OR ONLY) (NEEDLE) ×3 IMPLANT
NEEDLE HYPO 18GX1.5 SHARP (NEEDLE) ×3
NS IRRIG 1000ML POUR BTL (IV SOLUTION) ×3 IMPLANT
PACK TOTAL JOINT (CUSTOM PROCEDURE TRAY) ×3 IMPLANT
PACK UNIVERSAL I (CUSTOM PROCEDURE TRAY) ×3 IMPLANT
PAD ARMBOARD 7.5X6 YLW CONV (MISCELLANEOUS) ×3 IMPLANT
SPONGE LAP 18X18 X RAY DECT (DISPOSABLE) IMPLANT
SUCTION FRAZIER HANDLE 10FR (MISCELLANEOUS) ×2
SUCTION TUBE FRAZIER 10FR DISP (MISCELLANEOUS) IMPLANT
SUT MNCRL AB 4-0 PS2 18 (SUTURE) ×3 IMPLANT
SUT MON AB 2-0 CT1 36 (SUTURE) ×3 IMPLANT
SUT VIC AB 0 CT1 27 (SUTURE) ×3
SUT VIC AB 0 CT1 27XBRD ANBCTR (SUTURE) ×1 IMPLANT
SUT VIC AB 1 CT1 27 (SUTURE) ×3
SUT VIC AB 1 CT1 27XBRD ANBCTR (SUTURE) ×1 IMPLANT
SYR 50ML LL SCALE MARK (SYRINGE) ×3 IMPLANT
SYRINGE 20CC LL (MISCELLANEOUS) IMPLANT
TOWEL OR 17X24 6PK STRL BLUE (TOWEL DISPOSABLE) ×3 IMPLANT
TOWEL OR 17X26 10 PK STRL BLUE (TOWEL DISPOSABLE) ×3 IMPLANT
TRAY FOLEY CATH 16FRSI W/METER (SET/KITS/TRAYS/PACK) IMPLANT
TRAY IRRIG W/60CC SYR STRL (SET/KITS/TRAYS/PACK) ×3 IMPLANT
TUBE CONNECTING 12'X1/4 (SUCTIONS) ×1
TUBE CONNECTING 12X1/4 (SUCTIONS) ×1 IMPLANT
WATER STERILE IRR 1000ML POUR (IV SOLUTION) ×1 IMPLANT
YANKAUER SUCT BULB TIP NO VENT (SUCTIONS) ×3 IMPLANT

## 2016-02-26 NOTE — Interval H&P Note (Signed)
History and Physical Interval Note:  02/26/2016 6:59 AM  [name]  has presented today for surgery, with the diagnosis of OA RIGHT HIP  The various methods of treatment have been discussed with the patient and family. After consideration of risks, benefits and other options for treatment, the patient has consented to  Procedure(s): TOTAL HIP ARTHROPLASTY ANTERIOR APPROACH (Right) as a surgical intervention .  The patient's history has been reviewed, patient examined, no change in status, stable for surgery.  I have reviewed the patient's chart and labs.  Questions were answered to the patient's satisfaction.     Andrea Moreno

## 2016-02-26 NOTE — Transfer of Care (Signed)
Immediate Anesthesia Transfer of Care Note  Patient: Andrea Moreno  Procedure(s) Performed: Procedure(s): TOTAL HIP ARTHROPLASTY ANTERIOR APPROACH (Right)  Patient Location: PACU  Anesthesia Type:Spinal  Level of Consciousness: awake, alert  and oriented  Airway & Oxygen Therapy: Patient Spontanous Breathing and Patient connected to nasal cannula oxygen  Post-op Assessment: Report given to RN and Post -op Vital signs reviewed and stable  Post vital signs: Reviewed and stable  Last Vitals:  Vitals:   02/26/16 0654  BP: (!) 145/54  Pulse: 62  Resp: 18  Temp: 36.6 C    Last Pain:  Vitals:   02/26/16 0740  TempSrc:   PainSc: 2       Patients Stated Pain Goal: 5 ([id number])  Complications: No apparent anesthesia complications

## 2016-02-26 NOTE — Anesthesia Procedure Notes (Signed)
Spinal  Patient location during procedure: OR End time: 02/26/2016 9:33 AM Staffing Anesthesiologist: Annye Asa Performed: anesthesiologist  Preanesthetic Checklist Completed: patient identified, site marked, surgical consent, pre-op evaluation, timeout performed, IV checked, risks and benefits discussed and monitors and equipment checked Spinal Block Patient position: sitting Prep: Betadine, ChloraPrep and site prepped and draped Patient monitoring: heart rate, cardiac monitor, continuous pulse ox and blood pressure Approach: midline Location: L2-3 Injection technique: single-shot Needle Needle type: Quincke  Needle gauge: 25 G Needle length: 9 cm Additional Notes Pt identified in Operating room.  Monitors applied. Working IV access confirmed. Sterile prep, drape lumbar spine.  1% lido local L 2,3.  #25ga Quincke into clear CSF L 2,3.  12mg  0.75% Bupivacaine with dextrose injected with asp CSF beginning and end of injection.  Patient asymptomatic, VSS, no heme aspirated, tolerated well.  Jenita Seashore, MD

## 2016-02-26 NOTE — Anesthesia Postprocedure Evaluation (Signed)
Anesthesia Post Note  Patient: Andrea Moreno  Procedure(s) Performed: Procedure(s) (LRB): TOTAL HIP ARTHROPLASTY ANTERIOR APPROACH (Right)  Patient location during evaluation: PACU Anesthesia Type: Spinal Level of consciousness: awake and alert, oriented and patient cooperative Pain management: pain level controlled Vital Signs Assessment: post-procedure vital signs reviewed and stable Respiratory status: spontaneous breathing, nonlabored ventilation, respiratory function stable and patient connected to nasal cannula oxygen Cardiovascular status: blood pressure returned to baseline and stable Postop Assessment: no headache, spinal receding, patient able to bend at knees and no signs of nausea or vomiting Anesthetic complications: no    Last Vitals:  Vitals:   02/26/16 1430 02/26/16 1445  BP: (!) 106/43 (!) 117/48  Pulse: (!) 51 (!) 58  Resp: 13 14  Temp:      Last Pain:  Vitals:   02/26/16 1230  TempSrc:   PainSc: 0-No pain                 Valmore Arabie,E. Ledarrius Beauchaine

## 2016-02-26 NOTE — Anesthesia Procedure Notes (Signed)
Procedure Name: MAC Date/Time: 02/26/2016 9:15 AM Performed by: Eligha Bridegroom Pre-anesthesia Checklist: Patient identified, Emergency Drugs available, Suction available, Patient being monitored and Timeout performed Patient Re-evaluated:Patient Re-evaluated prior to inductionOxygen Delivery Method: Nasal cannula Preoxygenation: Pre-oxygenation with 100% oxygen Intubation Type: IV induction

## 2016-02-26 NOTE — Op Note (Signed)
02/26/2016  10:46 AM  PATIENT:  Andrea Moreno   MRN: [id number]  PRE-OPERATIVE DIAGNOSIS:  OA RIGHT HIP  POST-OPERATIVE DIAGNOSIS:  OA RIGHT HIP  PROCEDURE:  Procedure(s): TOTAL HIP ARTHROPLASTY ANTERIOR APPROACH  PREOPERATIVE INDICATIONS:    Andrea Moreno is an 75 y.o. female who has a diagnosis of Primary osteoarthritis of right hip and elected for surgical management after failing conservative treatment.  The risks benefits and alternatives were discussed with the patient including but not limited to the risks of nonoperative treatment, versus surgical intervention including infection, bleeding, nerve injury, periprosthetic fracture, the need for revision surgery, dislocation, leg length discrepancy, blood clots, cardiopulmonary complications, morbidity, mortality, among others, and they were willing to proceed.     OPERATIVE REPORT     SURGEON:   MURPHY, TIMOTHY D, MD    ASSISTANT:  none    ANESTHESIA:  General    COMPLICATIONS:  None.     COMPONENTS:  Stryker acolade fit femur size 5 with a 36 mm -5 head ball and a PSL acetabular shell size 46 with a  polyethylene liner    PROCEDURE IN DETAIL:   The patient was met in the holding area and  identified.  The appropriate hip was identified and marked at the operative site.  The patient was then transported to the OR  and  placed under anesthesia per that record.  At that point, the patient was  placed in the supine position and  secured to the operating room table and all bony prominences padded. He received pre-operative antibiotics    The operative lower extremity was prepped from the iliac crest to the distal leg.  Sterile draping was performed.  Time out was performed prior to incision.      Skin incision was made just 2 cm lateral to the ASIS  extending in line with the tensor fascia lata. Electrocautery was used to control all bleeders. I dissected down sharply to the fascia of the tensor fascia lata was confirmed  that the muscle fibers beneath were running posteriorly. I then incised the fascia over the superficial tensor fascia lata in line with the incision. The fascia was elevated off the anterior aspect of the muscle the muscle was retracted posteriorly and protected throughout the case. I then used electrocautery to incise the tensor fascia lata fascia control and all bleeders. Immediately visible was the fat over top of the anterior neck and capsule.  I removed the anterior fat from the capsule and elevated the rectus muscle off of the anterior capsule. I then removed a large time of capsule. The retractors were then placed over the anterior acetabulum as well as around the superior and inferior neck.  I then removed a section of the femoral neck and a napkin ring fashion. Then used the power course to remove the femoral head from the acetabulum and thoroughly irrigated the acetabulum. I sized the femoral head.    I then exposed the deep acetabulum, cleared out any tissue including the ligamentum teres.   After adequate visualization, I excised the labrum, and then sequentially reamed.  I then impacted the acetabular implant into place using fluoroscopy for guidance.  Appropriate version and inclination was confirmed clinically matching their bony anatomy, and with fluoroscopy.  I placed a 83m screw in the posterior/superio position with an excellent bite.    I then placed the polyethylene liner in place  I then adducted the leg and released the external rotators from the posterior femur  allowing it to be easily delivered up lateral and anterior to the acetabulum for preparation of the femoral canal.    I then prepared the proximal femur using the cookie-cutter and then sequentially reamed and broached.  A trial broach, neck, and head was utilized, and I reduced the hip and used floroscopy to assess the neck length and femoral implant.  I then impacted the femoral prosthesis into place into the  appropriate version. The hip was then reduced and fluoroscopy confirmed appropriate position. Leg lengths were restored.  I then irrigated the hip copiously again with, and repaired the fascia with Vicryl, followed by monocryl for the subcutaneous tissue, Monocryl for the skin, Steri-Strips and sterile gauze. The patient was then awakened and returned to PACU in stable and satisfactory condition. There were no complications.  POST OPERATIVE PLAN: WBAT, DVT px: SCD's/TED and xarelto  Edmonia Lynch, MD Orthopedic Surgeon [phone]

## 2016-02-26 NOTE — Anesthesia Preprocedure Evaluation (Addendum)
Anesthesia Evaluation  Patient identified by MRN, date of birth, ID band Patient awake    Reviewed: Allergy & Precautions, NPO status , Patient's Chart, lab work & pertinent test results  History of Anesthesia Complications Negative for: history of anesthetic complications  Airway Mallampati: II  TM Distance: >3 FB Neck ROM: Full    Dental  (+) Dental Advisory Given   Pulmonary former smoker (quit 1987),    breath sounds clear to auscultation       Cardiovascular hypertension, Pt. on medications (-) angina Rhythm:Regular Rate:Normal  '14 ECHO: EF 60-65%, mild-mod MR   Neuro/Psych Anxiety TIA   GI/Hepatic negative GI ROS, Neg liver ROS,   Endo/Other  negative endocrine ROS  Renal/GU negative Renal ROS     Musculoskeletal  (+) Arthritis , Osteoarthritis,    Abdominal   Peds  Hematology negative hematology ROS (+)   Anesthesia Other Findings   Reproductive/Obstetrics                            Anesthesia Physical Anesthesia Plan  ASA: II  Anesthesia Plan: Spinal   Post-op Pain Management:    Induction:   Airway Management Planned: Natural Airway and Simple Face Mask  Additional Equipment:   Intra-op Plan:   Post-operative Plan:   Informed Consent: I have reviewed the patients History and Physical, chart, labs and discussed the procedure including the risks, benefits and alternatives for the proposed anesthesia with the patient or authorized representative who has indicated his/her understanding and acceptance.   Dental advisory given  Plan Discussed with: CRNA and Surgeon  Anesthesia Plan Comments: (Plan routine monitors, SAB)        Anesthesia Quick Evaluation

## 2016-02-27 ENCOUNTER — Encounter (HOSPITAL_COMMUNITY): Payer: Self-pay | Admitting: Orthopedic Surgery

## 2016-02-27 LAB — CBC
HEMATOCRIT: 27 % — AB (ref 36.0–46.0)
Hemoglobin: 8.9 g/dL — ABNORMAL LOW (ref 12.0–15.0)
MCH: 30.3 pg (ref 26.0–34.0)
MCHC: 33 g/dL (ref 30.0–36.0)
MCV: 91.8 fL (ref 78.0–100.0)
PLATELETS: 139 10*3/uL — AB (ref 150–400)
RBC: 2.94 MIL/uL — ABNORMAL LOW (ref 3.87–5.11)
RDW: 12.9 % (ref 11.5–15.5)
WBC: 4.4 10*3/uL (ref 4.0–10.5)

## 2016-02-27 MED ORDER — RIVAROXABAN 10 MG PO TABS: 10.0000 mg | ORAL_TABLET | Freq: Every day | ORAL | 0 refills | Status: DC

## 2016-02-27 MED ORDER — ONDANSETRON HCL 4 MG PO TABS: 4.0000 mg | ORAL_TABLET | Freq: Three times a day (TID) | ORAL | 0 refills | Status: DC | PRN

## 2016-02-27 MED ORDER — METHOCARBAMOL 500 MG PO TABS: 500.0000 mg | ORAL_TABLET | Freq: Four times a day (QID) | ORAL | 1 refills | Status: DC | PRN

## 2016-02-27 MED ORDER — HYDROCODONE-ACETAMINOPHEN 5-325 MG PO TABS: 1.0000 | ORAL_TABLET | ORAL | 0 refills | Status: DC | PRN

## 2016-02-27 NOTE — Progress Notes (Signed)
Physical Therapy Treatment Patient Details Name: Andrea Moreno MRN: [id number] DOB: 1940/07/24 Today's Date: 02/27/2016    History of Present Illness Pt is a 75 y/o female s/p R THA (direct anterior approach). PMH including but not limited to HTN and TIA.    PT Comments    Pt presented sitting OOB in recliner when PT entered room. Pt successfully completed stair training during this session and is making excellent progress towards achieving her functional goals. Pt would continue to benefit from skilled physical therapy services at this time while admitted and after d/c to address her limitations in order to improve her overall safety and independence with functional mobility.   Follow Up Recommendations  Home health PT;Supervision for mobility/OOB     Equipment Recommendations  None recommended by PT    Recommendations for Other Services       Precautions / Restrictions Precautions Precautions: Fall Restrictions Weight Bearing Restrictions: Yes RLE Weight Bearing: Weight bearing as tolerated    Mobility  Bed Mobility Overal bed mobility: Needs Assistance Bed Mobility: Supine to Sit     Supine to sit: Supervision     General bed mobility comments: pt sitting OOB in recliner when PT entered room  Transfers Overall transfer level: Needs assistance Equipment used: Rolling walker (2 wheeled) Transfers: Sit to/from [facility] Sit to Stand: Supervision Stand pivot transfers: Supervision       General transfer comment: pt required increased time  Ambulation/Gait Ambulation/Gait assistance: Min guard Ambulation Distance (Feet): 150 Feet Assistive device: Rolling walker (2 wheeled) Gait Pattern/deviations: Step-through pattern;Decreased step length - left;Decreased stance time - right;Decreased weight shift to right Gait velocity: decreased Gait velocity interpretation: Below normal speed for age/gender General Gait Details: pt required  occasional VC'ing to remain a safe distance from the RW.   Stairs Stairs: Yes Stairs assistance: Min guard Stair Management: One rail Right;Sideways;Step to pattern Number of Stairs: 13 General stair comments: pt ascended with L LE leading and descended with R LE leading  Wheelchair Mobility    Modified Rankin (Stroke Patients Only)       Balance Overall balance assessment: Needs assistance Sitting-balance support: Feet supported;No upper extremity supported Sitting balance-Leahy Scale: Good     Standing balance support: During functional activity;No upper extremity supported Standing balance-Leahy Scale: Fair Standing balance comment: pt stood at sink to wash her hands but maintained one UE during task                    Cognition Arousal/Alertness: Awake/alert Behavior During Therapy: WFL for tasks assessed/performed Overall Cognitive Status: Within Functional Limits for tasks assessed                      Exercises Total Joint Exercises Ankle Circles/Pumps: AROM;Right;10 reps;Seated Quad Sets: AROM;Strengthening;Right;10 reps;Seated Heel Slides: AAROM;Right;10 reps;Seated Hip ABduction/ADduction: AROM;Strengthening;Right;10 reps;Seated Long Arc Quad: AROM;Strengthening;Right;10 reps;Seated    General Comments        Pertinent Vitals/Pain Pain Assessment: Faces Faces Pain Scale: Hurts a little bit Pain Location: R thigh Pain Descriptors / Indicators: Discomfort;Sore Pain Intervention(s): Monitored during session;Repositioned    Home Living Family/patient expects to be discharged to:: Private residence Living Arrangements: Spouse/significant other Available Help at Discharge: Family;Available 24 hours/day Type of Home: House Home Access: Stairs to enter Entrance Stairs-Rails: None Home Layout: Two level Home Equipment: Environmental consultant - 2 wheels;Cane - single point;Bedside commode;Other (comment) (reacher, "walking stick")      Prior Function  Level of Independence: Independent  Comments: pt stated that she occasionally used a "walking stick" when she was outside. 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   PT Goals (current goals can now be found in the care plan section) Acute Rehab PT Goals Patient Stated Goal: return home today PT Goal Formulation: With patient Time For Goal Achievement: 03/05/16 Potential to Achieve Goals: Good Progress towards PT goals: Progressing toward goals    Frequency    7X/week      PT Plan Current plan remains appropriate    Co-evaluation             End of Session Equipment Utilized During Treatment: Gait belt Activity Tolerance: Patient tolerated treatment well Patient left: in chair;with call bell/phone within reach     Time: 1145-1205 PT Time Calculation (min) (ACUTE ONLY): 20 min  Charges:  $Gait Training: 8-22 mins                    G CodesClearnce Sorrel Claud Gowan 03-15-2016, 12:13 PM Sherie Don, [place], DPT [phone]

## 2016-02-27 NOTE — Progress Notes (Addendum)
OT Cancellation Note  Patient Details Name: Andrea Moreno MRN: [id number] DOB: [birth year]   Cancelled Treatment:  Reason Eval/Treat Not Completed: OT screened, no needs identified, will sign off.  OT spoke with Pt. Pt mobilizing well and has no DME needs.  [address], OTR/L [id number] 02/27/2016, 12:38 PM

## 2016-02-27 NOTE — Care Management Note (Signed)
Case Management Note  Patient Details  Name: CHIDINMA GARL MRN: [id number] Date of Birth: 04-Jul-1940  Subjective/Objective:   75 yr old female s/p right total hip arthroplasty.                 Action/Plan: Patient was preoperatively setup with [place], no changes. Patient has all DME. Will have family support at discharge.  Expected Discharge Date:   02/27/16               Expected Discharge Plan:   Home with Home Health  In-House Referral:     Discharge planning Services  CM Consult  Post Acute Care Choice:  Home Health Choice offered to:  Patient  DME Arranged:  N/A DME Agency:     HH Arranged:  PT [place] Agency:  [place]  Status of Service:  Completed, signed off  If discussed at [place] of Stay Meetings, dates discussed:    Additional Comments:  Ninfa Meeker, RN 02/27/2016, 10:51 AM

## 2016-02-27 NOTE — Evaluation (Signed)
Physical Therapy Evaluation Patient Details Name: Andrea Moreno MRN: [id number] DOB: 1941/03/28 Today's Date: 02/27/2016   History of Present Illness  Pt is a 75 y/o female s/p R THA (direct anterior approach). PMH including but not limited to HTN and TIA.  Clinical Impression  Pt presented supine in bed with HOB elevated, awake and eager to participate in therapy session. Prior to admission, pt reported being independent with all functional mobility with the exception of occasional use of a "walking stick" when outside. Pt moved well during evaluation and PT planning to perform stair training at next session. Pt would continue to benefit from skilled physical therapy services at this time while admitted and after d/c to address her below listed limitations in order to improve her overall safety and independence with functional mobility.     Follow Up Recommendations Home health PT;Supervision for mobility/OOB    Equipment Recommendations  None recommended by PT;Other (comment) (pt reported having all necessary DME at home)    Recommendations for Other Services       Precautions / Restrictions Precautions Precautions: Fall Restrictions Weight Bearing Restrictions: Yes RLE Weight Bearing: Weight bearing as tolerated      Mobility  Bed Mobility Overal bed mobility: Needs Assistance Bed Mobility: Supine to Sit     Supine to sit: Supervision     General bed mobility comments: pt required increased time and use of bed rail. HOB was flat.  Transfers Overall transfer level: Needs assistance Equipment used: Rolling walker (2 wheeled) Transfers: Sit to/from Stand Sit to Stand: Min guard         General transfer comment: pt required increased time and VC'ing for bilateral hand positioning  Ambulation/Gait Ambulation/Gait assistance: Min guard Ambulation Distance (Feet): 100 Feet Assistive device: Rolling walker (2 wheeled) Gait Pattern/deviations: Step-to  pattern;Step-through pattern;Decreased step length - left;Decreased stance time - right;Decreased weight shift to right Gait velocity: decreased Gait velocity interpretation: Below normal speed for age/gender General Gait Details: pt required occasional VC'ing to remain a safe distance from the RW.  Stairs            Wheelchair Mobility    Modified Rankin (Stroke Patients Only)       Balance Overall balance assessment: Needs assistance Sitting-balance support: Feet supported;No upper extremity supported Sitting balance-Leahy Scale: Fair     Standing balance support: During functional activity;Single extremity supported Standing balance-Leahy Scale: Poor Standing balance comment: pt stood at sink to wash her hands but maintained one UE during task                             Pertinent Vitals/Pain Pain Assessment: 0-10 Pain Score: 4  Pain Location: R hip and thigh Pain Descriptors / Indicators: Sore;Grimacing;Guarding Pain Intervention(s): Monitored during session;Repositioned    Home Living Family/patient expects to be discharged to:: Private residence Living Arrangements: Spouse/significant other Available Help at Discharge: Family;Available 24 hours/day Type of Home: House Home Access: Stairs to enter Entrance Stairs-Rails: None Entrance Stairs-Number of Steps: 1 Home Layout: Two level Home Equipment: Walker - 2 wheels;Cane - single point;Bedside commode;Other (comment) (reacher, "walking stick")      Prior Function Level of Independence: Independent         Comments: pt stated that she occasionally used a "walking stick" when she was outside. pt is a retired Building control surveyor.     Hand Dominance        Extremity/Trunk Assessment   Upper Extremity  Assessment: Overall WFL for tasks assessed           Lower Extremity Assessment: RLE deficits/detail RLE Deficits / Details: pt with decreased strength and ROM limitations secondary to post-op.     Cervical / Trunk Assessment: Normal  Communication   Communication: No difficulties  Cognition Arousal/Alertness: Awake/alert Behavior During Therapy: WFL for tasks assessed/performed Overall Cognitive Status: Within Functional Limits for tasks assessed                      General Comments      Exercises Total Joint Exercises Ankle Circles/Pumps: AROM;Both;10 reps;Seated Long Arc Quad: AROM;Strengthening;Right;10 reps;Seated   Assessment/Plan    PT Assessment Patient needs continued PT services  PT Problem List Decreased strength;Decreased range of motion;Decreased activity tolerance;Decreased balance;Decreased mobility;Decreased coordination;Decreased knowledge of use of DME;Pain          PT Treatment Interventions DME instruction;Gait training;Stair training;Functional mobility training;Therapeutic activities;Therapeutic exercise;Balance training;Neuromuscular re-education;Patient/family education    PT Goals (Current goals can be found in the Care Plan section)  Acute Rehab PT Goals Patient Stated Goal: return home PT Goal Formulation: With patient Time For Goal Achievement: 03/05/16 Potential to Achieve Goals: Good    Frequency 7X/week   Barriers to discharge        Co-evaluation               End of Session Equipment Utilized During Treatment: Gait belt Activity Tolerance: Patient tolerated treatment well Patient left: in chair;with call bell/phone within reach Nurse Communication: Mobility status         Time: [id number] PT Time Calculation (min) (ACUTE ONLY): 32 min   Charges:   PT Evaluation $PT Eval Moderate Complexity: 1 Procedure PT Treatments $Gait Training: 8-22 mins   PT G CodesClearnce Sorrel Danford Tat 02/27/2016, 11:32 AM Sherie Don, PT, DPT [phone]

## 2016-02-27 NOTE — Discharge Summary (Signed)
Physician Discharge Summary  Patient ID: Andrea Moreno MRN: [id number] DOB/AGE: Feb 04, 1941 75 y.o.  Admit date: 02/26/2016 Discharge date: 02/27/2016  Admission Diagnoses:  Primary osteoarthritis of right hip  Discharge Diagnoses:  Principal Problem:   Primary osteoarthritis of right hip Active Problems:   History of TIA (transient ischemic attack)   Hyperlipidemia   HTN (hypertension)   Osteopenia   Vitamin D deficiency   GAD (generalized anxiety disorder)   Osteoarthritis   Aortic atherosclerosis (HCC)   Past Medical History:  Diagnosis Date  . Allergy   . Arthritis    "back, right shoulder, hips" (10/14/2012)  . Cataract   . Complication of anesthesia    "1950's had appendix out; kicked the RN; no problems since" (10/14/2012)  . Heart murmur    normal Echo 2014 per pt  . History of migraine    1960s  . Hyperlipidemia   . Hypertension   . Osteopenia   . Pneumonia    "couple of times; last time in the mid 1990's" (10/14/2012)  . TIA (transient ischemic attack) 10/14/2012    Surgeries: Procedure(s): TOTAL HIP ARTHROPLASTY ANTERIOR APPROACH on 02/26/2016   Consultants (if any):   Discharged Condition: Improved  Hospital Course: Andrea Moreno is an 75 y.o. female who was admitted 02/26/2016 with a diagnosis of Primary osteoarthritis of right hip and went to the operating room on 02/26/2016 and underwent the above named procedures.    She was given perioperative antibiotics:  Anti-infectives    Start     Dose/Rate Route Frequency Ordered Stop   02/26/16 1530  ceFAZolin (ANCEF) IVPB 2g/100 mL premix     2 g 200 mL/hr over 30 Minutes Intravenous Every 6 hours 02/26/16 1527 02/26/16 2231   02/26/16 0719  ceFAZolin (ANCEF) IVPB 2g/100 mL premix     2 g 200 mL/hr over 30 Minutes Intravenous On call to O.R. 02/26/16 0719 02/26/16 0945    .  She was given sequential compression devices, early ambulation, and xarelto for DVT prophylaxis.  She benefited maximally  from the hospital stay and there were no complications.    Recent vital signs:  Vitals:   02/27/16 0015 02/27/16 0352  BP: (!) 128/45 (!) 132/52  Pulse: 72 75  Resp: 16 (!) 196  Temp: 98.1 F (36.7 C) 98.1 F (36.7 C)    Recent laboratory studies:  Lab Results  Component Value Date   HGB 8.9 (L) 02/27/2016   HGB 10.7 (L) 02/26/2016   HGB 12.8 02/15/2016   Lab Results  Component Value Date   WBC 4.4 02/27/2016   PLT 139 (L) 02/27/2016   Lab Results  Component Value Date   INR 1.00 02/15/2016   Lab Results  Component Value Date   NA 139 02/15/2016   K 3.2 (L) 02/15/2016   CL 106 02/15/2016   CO2 25 02/15/2016   BUN 19 02/15/2016   CREATININE 0.78 02/15/2016   GLUCOSE 106 (H) 02/15/2016    Discharge Medications:     Medication List    STOP taking these medications   aspirin 325 MG EC tablet     TAKE these medications   ALPRAZolam 0.25 MG tablet Commonly known as:  XANAX TAKE 1 TABLET AT BEDTIME AS NEEDED FOR ANXIETY   calcium carbonate 500 MG chewable tablet Commonly known as:  TUMS - dosed in mg elemental calcium Chew 1 tablet by mouth daily.   cholecalciferol 400 units Tabs tablet Commonly known as:  VITAMIN D Take 400 Units  by mouth 2 (two) times daily.   cyclobenzaprine 5 MG tablet Commonly known as:  FLEXERIL Take 1 tablet (5 mg total) by mouth 2 (two) times daily as needed. What changed:  when to take this   ezetimibe 10 MG tablet Commonly known as:  ZETIA Take 1 tablet (10 mg total) by mouth daily.   fish oil-omega-3 fatty acids 1000 MG capsule Take 1 g by mouth 2 (two) times daily.   HYDROcodone-acetaminophen 5-325 MG tablet Commonly known as:  NORCO Take 1-2 tablets by mouth every 4 (four) hours as needed for moderate pain.   losartan-hydrochlorothiazide 100-25 MG tablet Commonly known as:  HYZAAR Take 1 tablet by mouth daily.   methocarbamol 500 MG tablet Commonly known as:  ROBAXIN Take 1 tablet (500 mg total) by mouth every 6  (six) hours as needed.   multivitamin capsule Take 1 capsule by mouth daily.   ondansetron 4 MG tablet Commonly known as:  ZOFRAN Take 1 tablet (4 mg total) by mouth every 8 (eight) hours as needed for nausea.   Potassium 99 MG Tabs Take 99 mg by mouth daily.   raloxifene 60 MG tablet Commonly known as:  EVISTA Take 1 tablet (60 mg total) by mouth daily.   rivaroxaban 10 MG Tabs tablet Commonly known as:  XARELTO Take 1 tablet (10 mg total) by mouth daily.       Diagnostic Studies: Dg C-arm 1-60 Min  Result Date: 02/26/2016 CLINICAL DATA:  75 year old female undergoing right hip replacement EXAM: DG C-ARM 61-120 MIN; OPERATIVE RIGHT HIP WITH PELVIS COMPARISON:  Prior MRI of the right hip 01/10/2016 FINDINGS: Two frontal views of the right hip demonstrate surgical changes of total hip arthroplasty. No evidence of immediate hardware complication. The femoral head component appears located within the acetabular component. IMPRESSION: Intraoperative radiographs during right-sided total hip replacement without evidence of complication. Electronically Signed   By: Jacqulynn Cadet M.D.   On: 02/26/2016 14:34   Dg Hip Operative Unilat With Pelvis Right  Result Date: 02/26/2016 CLINICAL DATA:  75 year old female undergoing right hip replacement EXAM: DG C-ARM 61-120 MIN; OPERATIVE RIGHT HIP WITH PELVIS COMPARISON:  Prior MRI of the right hip 01/10/2016 FINDINGS: Two frontal views of the right hip demonstrate surgical changes of total hip arthroplasty. No evidence of immediate hardware complication. The femoral head component appears located within the acetabular component. IMPRESSION: Intraoperative radiographs during right-sided total hip replacement without evidence of complication. Electronically Signed   By: Jacqulynn Cadet M.D.   On: 02/26/2016 14:34    Disposition: 01-Home or Self Care       Signed: Renette Butters 02/27/2016, 8:41 AM

## 2016-02-27 NOTE — Progress Notes (Signed)
AVS discharge instructions were reviewed with patient. Pt was given prescriptions for norco, zofran, xarelto,and robaxin to take to her pharmacy.Pt stated that she did not have any questions or concerns. Pt d/c in stable condition.Volunteers called to come and assist pt to her transportation.

## 2016-02-27 NOTE — Discharge Instructions (Signed)

## 2016-02-28 DIAGNOSIS — E559 Vitamin D deficiency, unspecified: Secondary | ICD-10-CM | POA: Diagnosis not present

## 2016-02-28 DIAGNOSIS — Z8673 Personal history of transient ischemic attack (TIA), and cerebral infarction without residual deficits: Secondary | ICD-10-CM | POA: Diagnosis not present

## 2016-02-28 DIAGNOSIS — M1990 Unspecified osteoarthritis, unspecified site: Secondary | ICD-10-CM | POA: Diagnosis not present

## 2016-02-28 DIAGNOSIS — I10 Essential (primary) hypertension: Secondary | ICD-10-CM | POA: Diagnosis not present

## 2016-02-28 DIAGNOSIS — M1611 Unilateral primary osteoarthritis, right hip: Secondary | ICD-10-CM | POA: Diagnosis not present

## 2016-02-28 DIAGNOSIS — I700 Atherosclerosis of aorta: Secondary | ICD-10-CM | POA: Diagnosis not present

## 2016-02-28 DIAGNOSIS — Z471 Aftercare following joint replacement surgery: Secondary | ICD-10-CM | POA: Diagnosis not present

## 2016-02-28 DIAGNOSIS — Z96641 Presence of right artificial hip joint: Secondary | ICD-10-CM | POA: Diagnosis not present

## 2016-02-28 DIAGNOSIS — E785 Hyperlipidemia, unspecified: Secondary | ICD-10-CM | POA: Diagnosis not present

## 2016-02-28 DIAGNOSIS — F419 Anxiety disorder, unspecified: Secondary | ICD-10-CM | POA: Diagnosis not present

## 2016-03-03 DIAGNOSIS — F419 Anxiety disorder, unspecified: Secondary | ICD-10-CM | POA: Diagnosis not present

## 2016-03-03 DIAGNOSIS — Z96641 Presence of right artificial hip joint: Secondary | ICD-10-CM | POA: Diagnosis not present

## 2016-03-03 DIAGNOSIS — M1611 Unilateral primary osteoarthritis, right hip: Secondary | ICD-10-CM | POA: Diagnosis not present

## 2016-03-03 DIAGNOSIS — I10 Essential (primary) hypertension: Secondary | ICD-10-CM | POA: Diagnosis not present

## 2016-03-03 DIAGNOSIS — I700 Atherosclerosis of aorta: Secondary | ICD-10-CM | POA: Diagnosis not present

## 2016-03-03 DIAGNOSIS — Z471 Aftercare following joint replacement surgery: Secondary | ICD-10-CM | POA: Diagnosis not present

## 2016-03-04 DIAGNOSIS — M1611 Unilateral primary osteoarthritis, right hip: Secondary | ICD-10-CM | POA: Diagnosis not present

## 2016-03-04 DIAGNOSIS — Z471 Aftercare following joint replacement surgery: Secondary | ICD-10-CM | POA: Diagnosis not present

## 2016-03-04 DIAGNOSIS — Z96641 Presence of right artificial hip joint: Secondary | ICD-10-CM | POA: Diagnosis not present

## 2016-03-04 DIAGNOSIS — I10 Essential (primary) hypertension: Secondary | ICD-10-CM | POA: Diagnosis not present

## 2016-03-04 DIAGNOSIS — F419 Anxiety disorder, unspecified: Secondary | ICD-10-CM | POA: Diagnosis not present

## 2016-03-04 DIAGNOSIS — I700 Atherosclerosis of aorta: Secondary | ICD-10-CM | POA: Diagnosis not present

## 2016-03-06 DIAGNOSIS — I10 Essential (primary) hypertension: Secondary | ICD-10-CM | POA: Diagnosis not present

## 2016-03-06 DIAGNOSIS — I700 Atherosclerosis of aorta: Secondary | ICD-10-CM | POA: Diagnosis not present

## 2016-03-06 DIAGNOSIS — M1611 Unilateral primary osteoarthritis, right hip: Secondary | ICD-10-CM | POA: Diagnosis not present

## 2016-03-06 DIAGNOSIS — F419 Anxiety disorder, unspecified: Secondary | ICD-10-CM | POA: Diagnosis not present

## 2016-03-06 DIAGNOSIS — Z471 Aftercare following joint replacement surgery: Secondary | ICD-10-CM | POA: Diagnosis not present

## 2016-03-06 DIAGNOSIS — Z96641 Presence of right artificial hip joint: Secondary | ICD-10-CM | POA: Diagnosis not present

## 2016-03-10 ENCOUNTER — Ambulatory Visit: Payer: Medicare Other | Admitting: Physical Therapy

## 2016-03-11 DIAGNOSIS — I10 Essential (primary) hypertension: Secondary | ICD-10-CM | POA: Diagnosis not present

## 2016-03-11 DIAGNOSIS — Z96641 Presence of right artificial hip joint: Secondary | ICD-10-CM | POA: Diagnosis not present

## 2016-03-11 DIAGNOSIS — F419 Anxiety disorder, unspecified: Secondary | ICD-10-CM | POA: Diagnosis not present

## 2016-03-11 DIAGNOSIS — I700 Atherosclerosis of aorta: Secondary | ICD-10-CM | POA: Diagnosis not present

## 2016-03-11 DIAGNOSIS — Z471 Aftercare following joint replacement surgery: Secondary | ICD-10-CM | POA: Diagnosis not present

## 2016-03-11 DIAGNOSIS — M1611 Unilateral primary osteoarthritis, right hip: Secondary | ICD-10-CM | POA: Diagnosis not present

## 2016-03-12 DIAGNOSIS — M1611 Unilateral primary osteoarthritis, right hip: Secondary | ICD-10-CM | POA: Diagnosis not present

## 2016-03-14 ENCOUNTER — Telehealth: Payer: Self-pay | Admitting: Family

## 2016-03-14 DIAGNOSIS — Z23 Encounter for immunization: Secondary | ICD-10-CM | POA: Diagnosis not present

## 2016-03-17 NOTE — Telephone Encounter (Signed)
Denied.

## 2016-04-09 DIAGNOSIS — M1611 Unilateral primary osteoarthritis, right hip: Secondary | ICD-10-CM | POA: Diagnosis not present

## 2016-04-14 ENCOUNTER — Other Ambulatory Visit: Payer: Self-pay | Admitting: Family

## 2016-04-15 NOTE — Telephone Encounter (Signed)
Xanax rx called to pharmacy

## 2016-04-29 ENCOUNTER — Encounter: Payer: Self-pay | Admitting: Family

## 2016-04-29 ENCOUNTER — Ambulatory Visit (INDEPENDENT_AMBULATORY_CARE_PROVIDER_SITE_OTHER): Payer: Medicare Other | Admitting: Family

## 2016-04-29 ENCOUNTER — Telehealth: Payer: Self-pay | Admitting: Family

## 2016-04-29 VITALS — BP 120/64 | HR 60 | Temp 97.0°F | Ht 60.0 in | Wt 148.6 lb

## 2016-04-29 DIAGNOSIS — G4700 Insomnia, unspecified: Secondary | ICD-10-CM

## 2016-04-29 MED ORDER — SUVOREXANT 10 MG PO TABS: 10.0000 mg | ORAL_TABLET | Freq: Every day | ORAL | 3 refills | Status: DC

## 2016-04-29 MED ORDER — TRAZODONE HCL 50 MG PO TABS: 50.0000 mg | ORAL_TABLET | Freq: Every evening | ORAL | 3 refills | Status: DC | PRN

## 2016-04-29 NOTE — Patient Instructions (Signed)
Insomnia Insomnia is a sleep disorder that makes it difficult to fall asleep or to stay asleep. Insomnia can cause tiredness (fatigue), low energy, difficulty concentrating, mood swings, and poor performance at work or school. There are three different ways to classify insomnia:  Difficulty falling asleep.  Difficulty staying asleep.  Waking up too early in the morning. Any type of insomnia can be long-term (chronic) or short-term (acute). Both are common. Short-term insomnia usually lasts for three months or less. Chronic insomnia occurs at least three times a week for longer than three months. What are the causes? Insomnia may be caused by another condition, situation, or substance, such as:  Anxiety.  Certain medicines.  Gastroesophageal reflux disease (GERD) or other gastrointestinal conditions.  Asthma or other breathing conditions.  Restless legs syndrome, sleep apnea, or other sleep disorders.  Chronic pain.  Menopause. This may include hot flashes.  Stroke.  Abuse of alcohol, tobacco, or illegal drugs.  Depression.  Caffeine.  Neurological disorders, such as Alzheimer disease.  An overactive thyroid (hyperthyroidism). The cause of insomnia may not be known. What increases the risk? Risk factors for insomnia include:  Gender. Women are more commonly affected than men.  Age. Insomnia is more common as you get older.  Stress. This may involve your professional or personal life.  Income. Insomnia is more common in people with lower income.  Lack of exercise.  Irregular work schedule or night shifts.  Traveling between different time zones. What are the signs or symptoms? If you have insomnia, trouble falling asleep or trouble staying asleep is the main symptom. This may lead to other symptoms, such as:  Feeling fatigued.  Feeling nervous about going to sleep.  Not feeling rested in the morning.  Having trouble concentrating.  Feeling irritable,  anxious, or depressed. How is this treated? Treatment for insomnia depends on the cause. If your insomnia is caused by an underlying condition, treatment will focus on addressing the condition. Treatment may also include:  Medicines to help you sleep.  Counseling or therapy.  Lifestyle adjustments. Follow these instructions at home:  Take medicines only as directed by your health care provider.  Keep regular sleeping and waking hours. Avoid naps.  Keep a sleep diary to help you and your health care provider figure out what could be causing your insomnia. Include:  When you sleep.  When you wake up during the night.  How well you sleep.  How rested you feel the next day.  Any side effects of medicines you are taking.  What you eat and drink.  Make your bedroom a comfortable place where it is easy to fall asleep:  Put up shades or special blackout curtains to block light from outside.  Use a white noise machine to block noise.  Keep the temperature cool.  Exercise regularly as directed by your health care provider. Avoid exercising right before bedtime.  Use relaxation techniques to manage stress. Ask your health care provider to suggest some techniques that may work well for you. These may include:  Breathing exercises.  Routines to release muscle tension.  Visualizing peaceful scenes.  Cut back on alcohol, caffeinated beverages, and cigarettes, especially close to bedtime. These can disrupt your sleep.  Do not overeat or eat spicy foods right before bedtime. This can lead to digestive discomfort that can make it hard for you to sleep.  Limit screen use before bedtime. This includes:  Watching TV.  Using your smartphone, tablet, and computer.  Stick to a   routine. This can help you fall asleep faster. Try to do a quiet activity, brush your teeth, and go to bed at the same time each night.  Get out of bed if you are still awake after 15 minutes of trying to  sleep. Keep the lights down, but try reading or doing a quiet activity. When you feel sleepy, go back to bed.  Make sure that you drive carefully. Avoid driving if you feel very sleepy.  Keep all follow-up appointments as directed by your health care provider. This is important. Contact a health care provider if:  You are tired throughout the day or have trouble in your daily routine due to sleepiness.  You continue to have sleep problems or your sleep problems get worse. Get help right away if:  You have serious thoughts about hurting yourself or someone else. This information is not intended to replace advice given to you by your health care provider. Make sure you discuss any questions you have with your health care provider. Document Released: 05/16/2000 Document Revised: 10/19/2015 Document Reviewed: 02/17/2014 Elsevier Interactive Patient Education  2017 [employer]  

## 2016-04-29 NOTE — Telephone Encounter (Signed)
Please review and advise.

## 2016-04-29 NOTE — Telephone Encounter (Signed)
Pt aware.

## 2016-04-29 NOTE — Telephone Encounter (Signed)
Trazodone Prescription sent to pharmacy  ° °

## 2016-04-29 NOTE — Progress Notes (Signed)
   Subjective:    Patient ID: Andrea Moreno, female    DOB: 1940-10-23, 75 y.o.   MRN: [id number]  PT presents to the office today with insomnia.  Insomnia  Primary symptoms: difficulty falling asleep, premature morning awakening.  The current episode started more than one year. The onset quality is gradual. The problem occurs every several days. The problem has been waxing and waning since onset. The symptoms are aggravated by anxiety. How many beverages per day that contain caffeine: 0 - 1.  The symptoms are relieved by medication. Past treatments include medication. The treatment provided moderate relief. Typical bedtime:  8-10 P.M..       Review of Systems  Psychiatric/Behavioral: The patient has insomnia.   All other systems reviewed and are negative.      Objective:   Physical Exam  Constitutional: She is oriented to person, place, and time. She appears well-developed and well-nourished. No distress.  Eyes: Pupils are equal, round, and reactive to light.  Neck: Normal range of motion. Neck supple. No thyromegaly present.  Cardiovascular: Normal rate, regular rhythm, normal heart sounds and intact distal pulses.   No murmur heard. Pulmonary/Chest: Effort normal and breath sounds normal. No respiratory distress. She has no wheezes.  Abdominal: Soft. Bowel sounds are normal. She exhibits no distension. There is no tenderness.  Musculoskeletal: Normal range of motion. She exhibits no edema or tenderness.  Neurological: She is alert and oriented to person, place, and time.  Skin: Skin is warm and dry.  Psychiatric: She has a normal mood and affect. Her behavior is normal. Judgment and thought content normal.  Vitals reviewed.     BP 120/64   Pulse 60   Temp 97 F (36.1 C) (Oral)   Ht 5' (1.524 m)   Wt 148 lb 9.6 oz (67.4 kg)   BMI 29.02 kg/m      Assessment & Plan:  1. Insomnia, unspecified type -Sleep ritual -Avoid reading or TV in bedroom -Pt started on  Belsomra 10 mg today -RTO prn - Suvorexant (BELSOMRA) 10 MG TABS; Take 10 mg by mouth at bedtime.  Dispense: 30 tablet; Refill: [place], FNP

## 2016-06-17 ENCOUNTER — Telehealth: Payer: Self-pay | Admitting: Family

## 2016-06-17 DIAGNOSIS — M25551 Pain in right hip: Secondary | ICD-10-CM | POA: Diagnosis not present

## 2016-06-17 DIAGNOSIS — M79604 Pain in right leg: Secondary | ICD-10-CM | POA: Diagnosis not present

## 2016-06-17 DIAGNOSIS — N809 Endometriosis, unspecified: Secondary | ICD-10-CM

## 2016-06-20 NOTE — Telephone Encounter (Signed)
Abd and Transvaginal US ordered

## 2016-06-20 NOTE — Telephone Encounter (Signed)
Why is patient being seen at [facility]? I do not see where we placed a referral for this?

## 2016-06-20 NOTE — Telephone Encounter (Signed)
Patient will send a letter,   per partner, Monday with details for the need for MRI  She recently got the results from 'MRI done in August by ortho checking a hip problem. Another ortho discovered results and advised follow up. Women[facility] required new MRI.

## 2016-06-20 NOTE — Telephone Encounter (Signed)
Please address

## 2016-06-24 ENCOUNTER — Telehealth: Payer: Self-pay | Admitting: Family

## 2016-06-24 ENCOUNTER — Telehealth: Payer: Self-pay

## 2016-06-24 NOTE — Telephone Encounter (Signed)
Patient aware that [place] ordered and we will contact her with appointment. Patient verbalizes understanding.

## 2016-06-25 NOTE — Telephone Encounter (Signed)
I ordered ultrasound on 06/20/16.Can we please check status of this? Thanks!

## 2016-06-25 NOTE — Telephone Encounter (Signed)
Patient states she is already aware of her [place] appointment.

## 2016-06-25 NOTE — Telephone Encounter (Signed)
Lmtcb.

## 2016-06-27 ENCOUNTER — Ambulatory Visit (HOSPITAL_COMMUNITY)
Admission: RE | Admit: 2016-06-27 | Discharge: 2016-06-27 | Disposition: A | Discharge status: Home / Self Care | Payer: Medicare Other | Source: Ambulatory Visit | Attending: Family | Admitting: Family

## 2016-06-27 DIAGNOSIS — R938 Abnormal findings on diagnostic imaging of other specified body structures: Secondary | ICD-10-CM | POA: Insufficient documentation

## 2016-06-27 DIAGNOSIS — Z780 Asymptomatic menopausal state: Secondary | ICD-10-CM | POA: Diagnosis not present

## 2016-06-27 DIAGNOSIS — N809 Endometriosis, unspecified: Secondary | ICD-10-CM | POA: Insufficient documentation

## 2016-06-30 DIAGNOSIS — Z0100 Encounter for examination of eyes and vision without abnormal findings: Secondary | ICD-10-CM | POA: Diagnosis not present

## 2016-06-30 DIAGNOSIS — H2513 Age-related nuclear cataract, bilateral: Secondary | ICD-10-CM | POA: Diagnosis not present

## 2016-07-01 DIAGNOSIS — Z6829 Body mass index (BMI) 29.0-29.9, adult: Secondary | ICD-10-CM | POA: Diagnosis not present

## 2016-07-01 DIAGNOSIS — R938 Abnormal findings on diagnostic imaging of other specified body structures: Secondary | ICD-10-CM | POA: Diagnosis not present

## 2016-07-11 DIAGNOSIS — M5416 Radiculopathy, lumbar region: Secondary | ICD-10-CM | POA: Diagnosis not present

## 2016-07-21 ENCOUNTER — Ambulatory Visit (INDEPENDENT_AMBULATORY_CARE_PROVIDER_SITE_OTHER): Payer: Medicare Other | Admitting: Family

## 2016-07-21 ENCOUNTER — Encounter: Payer: Self-pay | Admitting: Family

## 2016-07-21 VITALS — BP 114/67 | HR 53 | Temp 97.0°F | Ht 60.0 in | Wt 150.4 lb

## 2016-07-21 DIAGNOSIS — M8580 Other specified disorders of bone density and structure, unspecified site: Secondary | ICD-10-CM

## 2016-07-21 DIAGNOSIS — I10 Essential (primary) hypertension: Secondary | ICD-10-CM

## 2016-07-21 DIAGNOSIS — I700 Atherosclerosis of aorta: Secondary | ICD-10-CM

## 2016-07-21 DIAGNOSIS — G4700 Insomnia, unspecified: Secondary | ICD-10-CM | POA: Diagnosis not present

## 2016-07-21 DIAGNOSIS — E785 Hyperlipidemia, unspecified: Secondary | ICD-10-CM | POA: Diagnosis not present

## 2016-07-21 DIAGNOSIS — M159 Polyosteoarthritis, unspecified: Secondary | ICD-10-CM | POA: Diagnosis not present

## 2016-07-21 DIAGNOSIS — F411 Generalized anxiety disorder: Secondary | ICD-10-CM

## 2016-07-21 DIAGNOSIS — E559 Vitamin D deficiency, unspecified: Secondary | ICD-10-CM

## 2016-07-21 DIAGNOSIS — M1611 Unilateral primary osteoarthritis, right hip: Secondary | ICD-10-CM

## 2016-07-21 DIAGNOSIS — E663 Overweight: Secondary | ICD-10-CM

## 2016-07-21 MED ORDER — EZETIMIBE 10 MG PO TABS: 10.0000 mg | ORAL_TABLET | Freq: Every day | ORAL | 3 refills | Status: DC

## 2016-07-21 MED ORDER — RALOXIFENE HCL 60 MG PO TABS: 60.0000 mg | ORAL_TABLET | Freq: Every day | ORAL | 3 refills | Status: DC

## 2016-07-21 MED ORDER — TRAZODONE HCL 50 MG PO TABS: 50.0000 mg | ORAL_TABLET | Freq: Every evening | ORAL | 3 refills | Status: DC | PRN

## 2016-07-21 NOTE — Progress Notes (Signed)
Subjective:    Patient ID: Andrea Moreno, female    DOB: 11-11-1940, 76 y.o.   MRN: [id number]  Pt presents to the office today for chronic follow up.  Hypertension  This is a chronic problem. The current episode started more than 1 year ago. The problem has been resolved since onset. The problem is controlled. Associated symptoms include anxiety. Pertinent negatives include no headaches, palpitations, peripheral edema or shortness of breath. Risk factors for coronary artery disease include dyslipidemia, family history, obesity and post-menopausal state. Past treatments include angiotensin blockers and diuretics. The current treatment provides significant improvement. Hypertensive end-organ damage includes CVA (TIA). There is no history of kidney disease, CAD/MI or heart failure. There is no history of sleep apnea or a thyroid problem.  Hyperlipidemia  This is a chronic problem. The current episode started more than 1 year ago. The problem is controlled. Recent lipid tests were reviewed and are normal. Exacerbating diseases include obesity. She has no history of diabetes or hypothyroidism. Pertinent negatives include no leg pain, myalgias or shortness of breath. Current antihyperlipidemic treatment includes ezetimibe and diet change. The current treatment provides moderate improvement of lipids. Risk factors for coronary artery disease include hypertension, post-menopausal, dyslipidemia and family history.  Anxiety  Presents for initial visit. Onset was 6 to 12 months ago. The problem has been waxing and waning. Patient reports no decreased concentration, excessive worry, insomnia, nervous/anxious behavior, palpitations, panic, shortness of breath or suicidal ideas. Symptoms occur most days (nightly). The severity of symptoms is moderate. The quality of sleep is poor. Nighttime awakenings: several, occasional.   Her past medical history is significant for anxiety/panic attacks. Past treatments  include lifestyle changes and benzodiazephines. The treatment provided no relief. Compliance with prior treatments has been good.  Arthritis  Presents for follow-up visit. The disease course has been worsening. She complains of pain. Affected locations include the left MCP and right hip. Her pain is at a severity of 4/10 (when legs are crossed or walking). Associated symptoms include pain while resting. Pertinent negatives include no fatigue. Her past medical history is significant for osteoarthritis. Past treatments include rest and NSAIDs. Factors aggravating her arthritis include activity. Compliance with prior treatments has been good.  Insomnia  Primary symptoms: difficulty falling asleep, no frequent awakening.  The current episode started more than one year. The problem has been waxing and waning since onset.  Osteopenia PT currently taking Evista 60 mg daily. Pt's last Bone Density Scan was 09/20/14.    Review of Systems  Constitutional: Negative for fatigue.  HENT: Negative.   Eyes: Negative.   Respiratory: Negative.  Negative for shortness of breath.   Cardiovascular: Negative.  Negative for palpitations.  Gastrointestinal: Negative.   Endocrine: Negative.   Genitourinary: Negative.   Musculoskeletal: Positive for arthritis. Negative for myalgias.  Neurological: Negative.  Negative for headaches.  Hematological: Negative.   Psychiatric/Behavioral: Negative for decreased concentration and suicidal ideas. The patient is not nervous/anxious and does not have insomnia.   All other systems reviewed and are negative.      Objective:   Physical Exam  Constitutional: She is oriented to person, place, and time. She appears well-developed and well-nourished. No distress.  HENT:  Head: Normocephalic and atraumatic.  Right Ear: External ear normal.  Left Ear: External ear normal.  Nose: Nose normal.  Mouth/Throat: Oropharynx is clear and moist.  Eyes: Pupils are equal, round, and  reactive to light.  Neck: Normal range of motion. Neck supple. No thyromegaly  present.  Cardiovascular: Normal rate, regular rhythm, normal heart sounds and intact distal pulses.   No murmur heard. Pulmonary/Chest: Effort normal and breath sounds normal. No respiratory distress. She has no wheezes.  Abdominal: Soft. Bowel sounds are normal. She exhibits no distension. There is no tenderness.  Musculoskeletal: Normal range of motion. She exhibits no edema or tenderness.     Neurological: She is alert and oriented to person, place, and time.  Skin: Skin is warm and dry.  Psychiatric: She has a normal mood and affect. Her behavior is normal. Judgment and thought content normal.  Vitals reviewed.   BP 114/67   Pulse (!) 53   Temp 97 F (36.1 C) (Oral)   Ht 5' (1.524 m)   Wt 150 lb 6.4 oz (68.2 kg)   BMI 29.37 kg/m        Assessment & Plan:  1. Essential hypertension - CMP14+EGFR  2. Osteoarthritis of multiple joints, unspecified osteoarthritis type - CMP14+EGFR  3. Osteopenia, unspecified location - CMP14+EGFR - VITAMIN D 25 Hydroxy (Vit-D Deficiency, Fractures) - raloxifene (EVISTA) 60 MG tablet; Take 1 tablet (60 mg total) by mouth daily.  Dispense: 90 tablet; Refill: 3  4. Vitamin D deficiency - CMP14+EGFR - VITAMIN D 25 Hydroxy (Vit-D Deficiency, Fractures)  5. Hyperlipidemia, unspecified hyperlipidemia type - CMP14+EGFR - Lipid panel - ezetimibe (ZETIA) 10 MG tablet; Take 1 tablet (10 mg total) by mouth daily.  Dispense: 90 tablet; Refill: 3  6. GAD (generalized anxiety disorder) - CMP14+EGFR  7. Primary osteoarthritis of right hip  - CMP14+EGFR  8. Aortic atherosclerosis (HCC) - CMP14+EGFR - Lipid panel  9. Overweight (BMI 25.0-29.9) - CMP14+EGFR  10. Insomnia, unspecified type - CMP14+EGFR - traZODone (DESYREL) 50 MG tablet; Take 1-2 tablets (50-100 mg total) by mouth at bedtime as needed for sleep.  Dispense: 60 tablet; Refill: 3   Continue all  meds Labs pending Health Maintenance reviewed Diet and exercise encouraged RTO 6 months   Evelina Dun, FNP

## 2016-07-21 NOTE — Patient Instructions (Signed)

## 2016-07-22 LAB — LIPID PANEL
CHOLESTEROL TOTAL: 158 mg/dL (ref 100–199)
Chol/HDL Ratio: 2.7 ratio units (ref 0.0–4.4)
HDL: 58 mg/dL (ref >39)
LDL Calculated: 81 mg/dL (ref 0–99)
TRIGLYCERIDES: 97 mg/dL (ref 0–149)
VLDL Cholesterol Cal: 19 mg/dL (ref 5–40)

## 2016-07-22 LAB — CMP14+EGFR
A/G RATIO: 1.7 (ref 1.2–2.2)
ALT: 16 IU/L (ref 0–32)
AST: 15 IU/L (ref 0–40)
Albumin: 3.8 g/dL (ref 3.5–4.8)
Alkaline Phosphatase: 38 IU/L — ABNORMAL LOW (ref 39–117)
BILIRUBIN TOTAL: 0.4 mg/dL (ref 0.0–1.2)
BUN/Creatinine Ratio: 21 (ref 12–28)
BUN: 16 mg/dL (ref 8–27)
CALCIUM: 8.9 mg/dL (ref 8.7–10.3)
CO2: 24 mmol/L (ref 18–29)
Chloride: 103 mmol/L (ref 96–106)
Creatinine, Ser: 0.75 mg/dL (ref 0.57–1.00)
GFR calc Af Amer: 90 mL/min/{1.73_m2} (ref >59)
GFR calc non Af Amer: 78 mL/min/{1.73_m2} (ref >59)
Globulin, Total: 2.2 g/dL (ref 1.5–4.5)
Glucose: 94 mg/dL (ref 65–99)
POTASSIUM: 4.2 mmol/L (ref 3.5–5.2)
Sodium: 142 mmol/L (ref 134–144)
Total Protein: 6 g/dL (ref 6.0–8.5)

## 2016-07-22 LAB — VITAMIN D 25 HYDROXY (VIT D DEFICIENCY, FRACTURES): Vit D, 25-Hydroxy: 42.9 ng/mL (ref 30.0–100.0)

## 2016-10-06 ENCOUNTER — Telehealth: Payer: Self-pay | Admitting: Family

## 2016-10-07 ENCOUNTER — Other Ambulatory Visit: Payer: Self-pay | Admitting: *Deleted

## 2016-10-07 MED ORDER — CALCIUM CARBONATE 1500 (600 CA) MG PO TABS: 1.0000 | ORAL_TABLET | Freq: Two times a day (BID) | ORAL | 5 refills | Status: DC

## 2016-10-07 NOTE — Progress Notes (Signed)
Pt came into to request med list be updated Reviewed and corrected med list per pt request

## 2016-10-21 ENCOUNTER — Ambulatory Visit (INDEPENDENT_AMBULATORY_CARE_PROVIDER_SITE_OTHER): Payer: Medicare Other

## 2016-10-21 ENCOUNTER — Ambulatory Visit (INDEPENDENT_AMBULATORY_CARE_PROVIDER_SITE_OTHER): Payer: Medicare Other | Admitting: *Deleted

## 2016-10-21 VITALS — BP 108/72 | HR 68 | Ht 62.0 in | Wt 150.0 lb

## 2016-10-21 DIAGNOSIS — Z780 Asymptomatic menopausal state: Secondary | ICD-10-CM | POA: Diagnosis not present

## 2016-10-21 DIAGNOSIS — Z0000 Encounter for general adult medical examination without abnormal findings: Secondary | ICD-10-CM

## 2016-10-21 NOTE — Patient Instructions (Addendum)
   Andrea Moreno , Thank you for taking time to come for your Medicare Wellness Visit. I appreciate your ongoing commitment to your health goals. Please review the following plan we discussed and let me know if I can assist you in the future.   These are the goals we discussed: Goals    . Increase physical activity          Continue to stay physically active for at least 30 min daily.       This is a list of the screening recommended for you and due dates:  Health Maintenance  Topic Date Due  . Flu Shot  12/31/2016  . Stool Blood Test  01/21/2017  . Tetanus Vaccine  08/11/2023  . DEXA scan (bone density measurement)  Completed  . Pneumonia vaccines  Completed   A bone density scan will be done today. You will be called with the results. Complete a stool specimen card to check for blood at your next office visit in August. Keep follow up appointment with Evelina Dun, FNP in Aug 2018.

## 2016-10-21 NOTE — Progress Notes (Signed)
Subjective:   Andrea Moreno is a 76 y.o. female who presents for a subsequent Medicare Annual Wellness Visit. Andrea Moreno is retired and lives at home with her partner. She enjoys yardwork and gardening and is very active. She does not have any children.  Review of Systems     Cardiac Risk Factors include: advanced age (>74men, >44 women);family history of premature cardiovascular disease  Ortho: s/p right hip replacement by Fredonia Highland, MD in 02/2016. Feels that it is improving.   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   Other systems negative    Objective:    Today's Vitals   10/21/16 1055  BP: 108/72  Pulse: 68  Weight: 150 lb (68 kg)  Height: 5\' 2"  (1.575 m)   Body mass index is 27.44 kg/m.   Current Medications (verified) Outpatient Encounter Prescriptions as of 10/21/2016  Medication Sig  . acetaminophen (TYLENOL) 325 MG tablet Take 325 mg by mouth 2 (two) times daily.  Marland Kitchen aspirin 325 MG EC tablet Take 325 mg by mouth daily.  . calcium carbonate (CALTRATE 600) 1500 (600 Ca) MG TABS tablet Take 1 tablet (1,500 mg total) by mouth 2 (two) times daily with a meal.  . ezetimibe (ZETIA) 10 MG tablet Take 1 tablet (10 mg total) by mouth daily.  . fish oil-omega-3 fatty acids 1000 MG capsule Take 1 g by mouth 2 (two) times daily.   . Glucosamine HCl 1500 MG TABS Take 1,500 mg by mouth daily.   Marland Kitchen losartan-hydrochlorothiazide (HYZAAR) 100-25 MG tablet Take 1 tablet by mouth daily.  . Multiple Vitamin (MULTIVITAMIN) capsule Take 1 capsule by mouth daily.  . raloxifene (EVISTA) 60 MG tablet Take 1 tablet (60 mg total) by mouth daily.  . traZODone (DESYREL) 50 MG tablet Take 1-2 tablets (50-100 mg total) by mouth at bedtime as needed for sleep.   No facility-administered encounter medications on file as of 10/21/2016.     Allergies (verified) Celebrex [celecoxib]; Demerol [meperidine]; Lisinopril; Statins; and  Zyrtec [cetirizine]   History: Past Medical History:  Diagnosis Date  . Allergy   . Arthritis    "back, right shoulder, hips" (10/14/2012)  . Cataract   . Complication of anesthesia    "1950's had appendix out; kicked the RN; no problems since" (10/14/2012)  . Heart murmur    normal Echo 2014 per pt  . History of migraine    1960s  . Hyperlipidemia   . Hypertension   . Osteopenia   . Pneumonia    "couple of times; last time in the mid 1990's" (10/14/2012)  . TIA (transient ischemic attack) 10/14/2012   Past Surgical History:  Procedure Laterality Date  . APPENDECTOMY  1950's  . BACK SURGERY    . COLONOSCOPY    . DILATION AND CURETTAGE OF UTERUS  ~ 1975  . [place]   "had a disc removed" (10/14/2012)  . TONSILLECTOMY  1940's  . TOTAL HIP ARTHROPLASTY Right 02/26/2016   Procedure: TOTAL HIP ARTHROPLASTY ANTERIOR APPROACH;  Surgeon: Renette Butters, MD;  Location: [facility];  Service: Orthopedics;  Laterality: Right;  . TUBAL LIGATION  1960's   Family History  Problem Relation Age of Onset  . Heart disease Mother   . Diabetes Mother   . Arthritis Mother   . Vascular Disease Mother   . Diabetes Sister   . Hypertension Sister   . Arthritis Sister   .  Early death Brother   . Vascular Disease Brother   . Heart attack Brother   . Arthritis Father   . Heart attack Brother   . Diabetes Sister   . Hypertension Sister   . Arthritis Sister    Social History   Occupational History  . Not on file.   Social History Main Topics  . Smoking status: Former Smoker    Packs/day: 0.30    Years: 5.00    Types: Cigarettes    Quit date: 08/31/1985  . Smokeless tobacco: Never Used  . Alcohol use 0.6 oz/week    1 Cans of beer per week     Comment: 10/14/2012 "~ 6 oz wine/day", 3 times a month (9/17)  . Drug use: No  . Sexual activity: No    Tobacco Counseling No tobacco use  Activities of Daily Living In your present state of health, do you have any difficulty  performing the following activities: 10/21/2016 02/15/2016  Hearing? Tempie Donning  Vision? N N  Difficulty concentrating or making decisions? N N  Walking or climbing stairs? Y N  Dressing or bathing? N N  Doing errands, shopping? N N  Preparing Food and eating ? N -  Using the Toilet? N -  In the past six months, have you accidently leaked urine? N -  Do you have problems with loss of bowel control? N -  Managing your Medications? N -  Managing your Finances? N -  Housekeeping or managing your Housekeeping? N -  Some recent data might be hidden  Wears hearing aids. Has some difficulty with stairs due to right hip replacement and left leg weakness. Uses handrails.   Immunizations and Health Maintenance Immunization History  Administered Date(s) Administered  . Influenza, High Dose Seasonal PF 03/09/2013, 02/15/2014, 03/20/2015  . Influenza-Unspecified 03/14/2016  . Pneumococcal Conjugate-13 01/16/2015  . Pneumococcal Polysaccharide-23 10/15/2012  . Tdap 08/10/2013  . Zoster 06/02/2004   There are no preventive care reminders to display for this patient.  Patient Care Team: Sharion Balloon, FNP as PCP - General (Nurse Practitioner) Almedia Balls, MD as Consulting Physician (Orthopedic Surgery) Williams Che, MD (Inactive) as Consulting Physician (Ophthalmology)     Assessment:   This is a routine wellness examination for Andrea Moreno.   Hearing/Vision screen Mild hearing deficit noted. No vision deficits noted during visit.  Dietary issues and exercise activities discussed: Current Exercise Habits: The patient does not participate in regular exercise at present (Does not formally exercise but works in her yard and gardens daily. ), Exercise limited by: orthopedic condition(s) (Left leg weakness due to nerve injury in lower back and recent right hip replacement)  Goals    . Increase physical activity          Continue to stay physically active for at least 30 min daily.       Depression Screen PHQ 2/9 Scores 10/21/2016 07/21/2016 04/29/2016 01/22/2016 01/17/2016 07/20/2015 01/16/2015  PHQ - 2 Score 0 0 0 0 0 0 0    Fall Risk Fall Risk  10/21/2016 07/21/2016 04/29/2016 01/22/2016 01/17/2016  Falls in the past year? No No No No No  Number falls in past yr: (No Data) - - - -    Cognitive Function: MMSE - Mini Mental State Exam 10/21/2016 09/20/2014  Orientation to time 5 5  Orientation to Place 5 5  Registration 3 3  Attention/ Calculation 3 5  Recall 3 2  Language- name 2 objects 2 2  Language- repeat 1  1  Language- follow 3 step command 3 3  Language- read & follow direction 1 1  Write a sentence 1 1  Copy design 1 1  Total score 28 29        Screening Tests Health Maintenance  Topic Date Due  . INFLUENZA VACCINE  12/31/2016  . COLON CANCER SCREENING ANNUAL FOBT  01/21/2017  . TETANUS/TDAP  08/11/2023  . DEXA SCAN  Completed  . PNA vac Low Risk Adult  Completed      Plan:  Continue to stay active for at least 30 minutes daily.  Keep f/u with Evelina Dun, FNP on 01/19/17. Dexa scan done today. Move slowly to avoid falls.   I have personally reviewed and noted the following in the patient's chart:   . Medical and social history . Use of alcohol, tobacco or illicit drugs  . Current medications and supplements . Functional ability and status . Nutritional status . Physical activity . Advanced directives . List of other physicians . Hospitalizations, surgeries, and ER visits in previous 12 months . Vitals . Screenings to include cognitive, depression, and falls . Referrals and appointments  In addition, I have reviewed and discussed with patient certain preventive protocols, quality metrics, and best practice recommendations. A written personalized care plan for preventive services as well as general preventive health recommendations were provided to patient.     Chong Sicilian, RN  10/21/2016   I have reviewed and agree with the above  AWV documentation.   Evelina Dun, FNP

## 2016-11-18 DIAGNOSIS — M25551 Pain in right hip: Secondary | ICD-10-CM | POA: Diagnosis not present

## 2016-12-11 DIAGNOSIS — M5136 Other intervertebral disc degeneration, lumbar region: Secondary | ICD-10-CM | POA: Diagnosis not present

## 2016-12-11 DIAGNOSIS — M810 Age-related osteoporosis without current pathological fracture: Secondary | ICD-10-CM | POA: Diagnosis not present

## 2016-12-11 DIAGNOSIS — M150 Primary generalized (osteo)arthritis: Secondary | ICD-10-CM | POA: Diagnosis not present

## 2016-12-11 DIAGNOSIS — Z8673 Personal history of transient ischemic attack (TIA), and cerebral infarction without residual deficits: Secondary | ICD-10-CM | POA: Diagnosis not present

## 2016-12-18 ENCOUNTER — Other Ambulatory Visit: Payer: Self-pay | Admitting: Family

## 2016-12-18 DIAGNOSIS — G4700 Insomnia, unspecified: Secondary | ICD-10-CM

## 2016-12-30 ENCOUNTER — Ambulatory Visit: Payer: Medicare Other | Attending: Internal Medicine | Admitting: Physical Therapy

## 2016-12-30 DIAGNOSIS — R293 Abnormal posture: Secondary | ICD-10-CM | POA: Diagnosis not present

## 2016-12-30 NOTE — Therapy (Signed)
[facility] [place], [place], [address] Phone: [phone]   Fax:  [phone]  Physical Therapy Evaluation  Patient Details  Name: Andrea Moreno MRN: [id number] Date of Birth: 03/27/1941 Referring Provider: Leigh Aurora MD  Encounter Date: 12/30/2016      PT End of Session - 12/30/16 1333    Visit Number 1   Number of Visits 8   Date for PT Re-Evaluation 01/27/17   PT Start Time 1030   PT Stop Time 1059   PT Time Calculation (min) 29 min   Activity Tolerance Patient tolerated treatment well   Behavior During Therapy [facility] for tasks assessed/performed      Past Medical History:  Diagnosis Date  . Allergy   . Arthritis    "back, right shoulder, hips" (10/14/2012)  . Cataract   . Complication of anesthesia    "1950's had appendix out; kicked the RN; no problems since" (10/14/2012)  . Heart murmur    normal Echo 2014 per pt  . History of migraine    1960s  . Hyperlipidemia   . Hypertension   . Osteopenia   . Pneumonia    "couple of times; last time in the mid 1990's" (10/14/2012)  . TIA (transient ischemic attack) 10/14/2012    Past Surgical History:  Procedure Laterality Date  . APPENDECTOMY  1950's  . BACK SURGERY    . COLONOSCOPY    . DILATION AND CURETTAGE OF UTERUS  ~ 1975  . [place]   "had a disc removed" (10/14/2012)  . TONSILLECTOMY  1940's  . TOTAL HIP ARTHROPLASTY Right 02/26/2016   Procedure: TOTAL HIP ARTHROPLASTY ANTERIOR APPROACH;  Surgeon: Renette Butters, MD;  Location: [facility];  Service: Orthopedics;  Laterality: Right;  . TUBAL LIGATION  1960's    There were no vitals filed for this visit.       Subjective Assessment - 12/30/16 1319    Subjective The patient presents to OPPT with c/o right hip stiffness.  She is not to hopeful that therapy will work.  She has a TENS unit that does not help and has massage in the past that provided only temporary relief and does not  want to try that again.  Futhermore, she has no interest in exercise or ultrasound.  I told her about the only other viable option would be to try dry needling.  She was provided with information and informed consent and seemed willing to try it when she returns next week.  She states she has always been stiff and feels it is related to an inability to sleep well.  However, she is now on a sleep medication and it seems to be wroking.     Pertinent History Right total hip replacement.  Osteopenia and OA.   Patient Stated Goals Be less stiff.   Currently in Pain? No/denies            [facility] PT Assessment - 12/30/16 0001      Assessment   Medical Diagnosis Lumbar DDD; tight hamstrings.   Referring Provider Leigh Aurora MD   Onset Date/Surgical Date --  Ongoing.     Precautions   Precautions --  Right THA.     Restrictions   Weight Bearing Restrictions No     Balance Screen   Has the patient fallen in the past 6 months No   Has the patient had a decrease in activity level because of a fear of falling?  No   Is the patient reluctant to leave their home because of a fear of falling?  No     Home Environment   Living Environment Private residence     Prior Function   Level of Independence Independent     Posture/Postural Control   Posture Comments Scoliosis of spine and trunk flexed in standing.     ROM / Strength   AROM / PROM / Strength AROM;Strength     AROM   Overall AROM Comments Very tight hamstings measured at 35 degrees bilaterally.  WFL for LE mindful of right THA.     Strength   Overall Strength Comments LE bilaterally 4 to 4+/5 throughout.     Palpation   Palpation comment Very tender to palpation on right Piriformis; TFL; Glut min; med and max and to a lesser degrees on the left.     Ambulation/Gait   Gait Comments The patient walks in trunck flexion with a decreased step and stride length.            Objective measurements completed on examination:  See above findings.                  PT Education - 12/30/16 1330    Education provided Yes   Education Details Supine HSS.   Person(s) Educated Patient   Methods Explanation   Comprehension Verbalized understanding;Returned demonstration;Need further instruction          PT Short Term Goals - 12/30/16 1337      PT SHORT TERM GOAL #1   Title STG's=LTG's.           PT Long Term Goals - 12/30/16 1337      PT LONG TERM GOAL #1   Title Patient reports a 50% reduction in stiffness.   Time 4   Period Weeks   Status New                Plan - 12/30/16 1334    Clinical Impression Statement The patient presents with c/o stiffness in bilateral buttocks right > left.  She has very tight hamstrings.  Patient really only willing to try dry needling at this time.   History and Personal Factors relevant to plan of care: Right THA; OA; osteopenia.   Clinical Presentation Evolving   Clinical Decision Making Low   Rehab Potential Fair   PT Frequency 2x / week   PT Duration 4 weeks   PT Treatment/Interventions Moist Heat;Dry needling;Passive range of motion   PT Next Visit Plan Hamstring stretching; HMP (per patient) and dry needling.   Consulted and Agree with Plan of Care Patient      Patient will benefit from skilled therapeutic intervention in order to improve the following deficits and impairments:  Decreased activity tolerance, Postural dysfunction  Visit Diagnosis: Abnormal posture - Plan: PT plan of care cert/re-cert      G-Codes - 75/64/33 1241    Functional Assessment Tool Used (Outpatient Only) Clinical judgement.   Functional Limitation Mobility: Walking and moving around   Mobility: Walking and Moving Around Current Status [phone]) At least 40 percent but less than 60 percent impaired, limited or restricted   Mobility: Walking and Moving Around Goal Status [phone]) At least 1 percent but less than 20 percent impaired, limited or restricted        Problem List Patient Active Problem List   Diagnosis Date Noted  . Overweight (BMI 25.0-29.9) 07/21/2016  . Insomnia 07/21/2016  . Primary osteoarthritis of  right hip 01/28/2016  . Aortic atherosclerosis ([place]) 01/22/2016  . Osteoarthritis 07/20/2015  . GAD (generalized anxiety disorder) 01/16/2015  . Vitamin D deficiency 01/16/2014  . Osteopenia 01/21/2013  . History of TIA (transient ischemic attack) 10/14/2012  . Hyperlipidemia 10/14/2012  . HTN (hypertension) 10/14/2012  . Leukopenia 10/14/2012    APPLEGATE, [place] MPT 12/30/2016, 1:39 PM  [facility] [address] [place], [place], [address] Phone: [phone]   Fax:  [phone]  Name: RACHELL DRUCKENMILLER MRN: [id number] Date of Birth: 1941/03/21

## 2017-01-05 ENCOUNTER — Ambulatory Visit: Payer: Medicare Other | Attending: Internal Medicine | Admitting: Physical Therapy

## 2017-01-05 DIAGNOSIS — R293 Abnormal posture: Secondary | ICD-10-CM | POA: Insufficient documentation

## 2017-01-05 NOTE — Patient Instructions (Signed)
Hip Flexor Stretch   Lying on back near edge of bed, bend one leg, foot flat. Hang other leg over edge, relaxed, thigh resting entirely on bed for _1___ minutes. Repeat __3 times. Do _2-3___ sessions per day. Advanced Exercise: Bend knee back keeping thigh in contact with bed.  http://[email or web address].[email or web address]   HIP: Flexors - Supine   Lie on edge of surface. Place leg off the surface, allow knee to bend. Bring other knee toward chest. Hold _60__ seconds. _3__ reps per set, _2-3__ times per day, Rest lowered foot on stool if needed.  Leg Extension (Hamstring)   Sit toward front edge of chair, with leg out straight, heel on floor, toes pointing toward body. Keeping back straight, bend forward at hip, until a stretch is felt. Hold 30-60 seconds. Repeat _3__ times. Repeat with other leg. Do __2-3_ sessions per day. Variation: Place stool under your foot for a better stretch.  Trigger Point Dry Needling  . What is Trigger Point Dry Needling (DN)? o DN is a physical therapy technique used to treat muscle pain and dysfunction. Specifically, DN helps deactivate muscle trigger points (muscle knots).  o A thin filiform needle is used to penetrate the skin and stimulate the underlying trigger point. The goal is for a local twitch response (LTR) to occur and for the trigger point to relax. No medication of any kind is injected during the procedure.   . What Does Trigger Point Dry Needling Feel Like?  o The procedure feels different for each individual patient. Some patients report that they do not actually feel the needle enter the skin and overall the process is not painful. Very mild bleeding may occur. However, many patients feel a deep cramping in the muscle in which the needle was inserted. This is the local twitch response.   Marland Kitchen How Will I feel after the treatment? o Soreness is normal, and the onset of soreness may not occur for a few hours. Typically this soreness does not last longer than two days.    o Bruising is uncommon, however; ice can be used to decrease any possible bruising.  o In rare cases feeling tired or nauseous after the treatment is normal. In addition, your symptoms may get worse before they get better, this period will typically not last longer than 24 hours.   . What Can I do After My Treatment? o Increase your hydration by drinking more water for the next 24 hours. o You may place ice or heat on the areas treated that have become sore, however, do not use heat on inflamed or bruised areas. Heat often brings more relief post needling. o You can continue your regular activities, but vigorous activity is not recommended initially after the treatment for 24 hours. o DN is best combined with other physical therapy such as strengthening, stretching, and other therapies.    Precautions:  In some cases, dry needling is done over the lung field. While rare, there is a risk of pneumothorax (punctured lung). Because of this, if you ever experience shortness of breath on exertion, difficulty taking a deep breath, chest pain or a dry cough following dry needling, you should report to an emergency room and tell them that you have been dry needled over the thorax.   [name], PT 01/05/17 10:31 AM [facility] [address] [place], [place], [address] Phone: [phone]   Fax:  [phone]

## 2017-01-05 NOTE — Therapy (Signed)
[facility] [place], [place], [address] Phone: [phone]   Fax:  [phone]  Physical Therapy Treatment  Patient Details  Name: Andrea Moreno MRN: [id number] Date of Birth: March 25, 1941 Referring Provider: Leigh Aurora MD  Encounter Date: 01/05/2017      PT End of Session - 01/05/17 0945    Visit Number 2   Number of Visits 8   Date for PT Re-Evaluation 01/27/17   PT Start Time 0945   PT Stop Time 1032   PT Time Calculation (min) 47 min   Activity Tolerance Patient tolerated treatment well   Behavior During Therapy [facility] for tasks assessed/performed      Past Medical History:  Diagnosis Date  . Allergy   . Arthritis    "back, right shoulder, hips" (10/14/2012)  . Cataract   . Complication of anesthesia    "1950's had appendix out; kicked the RN; no problems since" (10/14/2012)  . Heart murmur    normal Echo 2014 per pt  . History of migraine    1960s  . Hyperlipidemia   . Hypertension   . Osteopenia   . Pneumonia    "couple of times; last time in the mid 1990's" (10/14/2012)  . TIA (transient ischemic attack) 10/14/2012    Past Surgical History:  Procedure Laterality Date  . APPENDECTOMY  1950's  . BACK SURGERY    . COLONOSCOPY    . DILATION AND CURETTAGE OF UTERUS  ~ 1975  . [place]   "had a disc removed" (10/14/2012)  . TONSILLECTOMY  1940's  . TOTAL HIP ARTHROPLASTY Right 02/26/2016   Procedure: TOTAL HIP ARTHROPLASTY ANTERIOR APPROACH;  Surgeon: Renette Butters, MD;  Location: [facility];  Service: Orthopedics;  Laterality: Right;  . TUBAL LIGATION  1960's    There were no vitals filed for this visit.      Subjective Assessment - 01/05/17 0945    Subjective Patient presents today with complaints of R hip stiffness and pain in the R hip into buttock when she stands from sitting.    Pertinent History Right total hip replacement.  Osteopenia and OA.   Patient Stated Goals Be less  stiff.   Currently in Pain? Yes   Pain Score 1    Pain Location Hip   Pain Orientation Right   Pain Descriptors / Indicators Aching   Pain Onset More than a month ago   Pain Frequency Intermittent   Aggravating Factors  sit to stand   Pain Relieving Factors moving                         [facility] Adult PT Treatment/Exercise - 01/05/17 0001      Exercises   Exercises Knee/Hip     Knee/Hip Exercises: Stretches   Active Hamstring Stretch 1 rep;30 seconds   Active Hamstring Stretch Limitations marked HS tightness; multiple HS stretches attempted including dynamic stretches in standing before seated with foot on stool decided upon.   Hip Flexor Stretch Right;20 seconds;2 reps   Hip Flexor Stretch Limitations with leg of EOB; pt had difficulty tolerating if knee bent at all   ITB Stretch Left   ITB Stretch Limitations attempted in [facility] butpatient too tight to tolerate.     Manual Therapy   Manual Therapy Soft tissue mobilization;Myofascial release   Soft tissue mobilization to R gluteals, TFL, lateral quads and HS   Myofascial Release to R ITB  Trigger Point Dry Needling - 01/05/17 1217    Consent Given? Yes   Education Handout Provided Yes   Muscles Treated Lower Body Gluteus minimus;Gluteus maximus;Tensor fascia lata;Quadriceps  R; also glut medius   Gluteus Maximus Response Twitch response elicited;Palpable increased muscle length   Gluteus Minimus Response Twitch response elicited;Palpable increased muscle length   Tensor Fascia Lata Response Twitch response elicited;Palpable increased muscle length   Quadriceps Response Twitch response elicited;Palpable increased muscle length              PT Education - 01/05/17 1218    Education provided Yes   Education Details DN education and aftercare; HEP   Methods Explanation;Demonstration;Handout   Comprehension Verbalized understanding;Returned demonstration          PT Short Term Goals -  12/30/16 1337      PT SHORT TERM GOAL #1   Title STG's=LTG's.           PT Long Term Goals - 12/30/16 1337      PT LONG TERM GOAL #1   Title Patient reports a 50% reduction in stiffness.   Time 4   Period Weeks   Status New               Plan - 01/05/17 1218    Clinical Impression Statement Patient was educated re: TPDN and consented to treatment. She has marked tightness of B HS, R HF, TFL, quads and ITB with latent trigger points throughout. She had ++ localized twitch responses in all muscles needled and reported immediate relief in R lateral quads. She tolerated HS and HF stretches only fair. Compliance with HEP was emphasized by PT. No goals met as only second visit.   Rehab Potential Fair   PT Frequency 2x / week   PT Duration 4 weeks   PT Treatment/Interventions Moist Heat;Dry needling;Passive range of motion   PT Next Visit Plan Assess DN, continue manual to gluteals, TFL and R quads/HS; DN prn; HS, HF, ITB, quad stretching; HMP (per patient) and dry needling.   PT Home Exercise Plan supine HF and seated HS stretch   Consulted and Agree with Plan of Care Patient      Patient will benefit from skilled therapeutic intervention in order to improve the following deficits and impairments:  Decreased activity tolerance, Postural dysfunction  Visit Diagnosis: Abnormal posture     Problem List Patient Active Problem List   Diagnosis Date Noted  . Overweight (BMI 25.0-29.9) 07/21/2016  . Insomnia 07/21/2016  . Primary osteoarthritis of right hip 01/28/2016  . Aortic atherosclerosis ([place]) 01/22/2016  . Osteoarthritis 07/20/2015  . GAD (generalized anxiety disorder) 01/16/2015  . Vitamin D deficiency 01/16/2014  . Osteopenia 01/21/2013  . History of TIA (transient ischemic attack) 10/14/2012  . Hyperlipidemia 10/14/2012  . HTN (hypertension) 10/14/2012  . Leukopenia 10/14/2012    [name] PT 01/05/2017, 12:25 PM  [facility] [address] [place], [place], [address] Phone: [phone]   Fax:  [phone]  Name: Andrea Moreno MRN: [id number] Date of Birth: [birth year]

## 2017-01-13 ENCOUNTER — Ambulatory Visit: Payer: Medicare Other | Admitting: Physical Therapy

## 2017-01-13 DIAGNOSIS — R293 Abnormal posture: Secondary | ICD-10-CM

## 2017-01-13 NOTE — Therapy (Signed)
[facility] [place], [place], [address] Phone: [phone]   Fax:  [phone]  Physical Therapy Treatment  Patient Details  Name: Andrea Moreno MRN: [id number] Date of Birth: 1941-04-04 Referring Provider: Leigh Aurora MD  Encounter Date: 01/13/2017      PT End of Session - 01/13/17 0906    Visit Number 3   Number of Visits 8   Date for PT Re-Evaluation 01/27/17   PT Start Time 0905   PT Stop Time 0943   PT Time Calculation (min) 38 min   Activity Tolerance Patient tolerated treatment well   Behavior During Therapy [facility] for tasks assessed/performed      Past Medical History:  Diagnosis Date  . Allergy   . Arthritis    "back, right shoulder, hips" (10/14/2012)  . Cataract   . Complication of anesthesia    "1950's had appendix out; kicked the RN; no problems since" (10/14/2012)  . Heart murmur    normal Echo 2014 per pt  . History of migraine    1960s  . Hyperlipidemia   . Hypertension   . Osteopenia   . Pneumonia    "couple of times; last time in the mid 1990's" (10/14/2012)  . TIA (transient ischemic attack) 10/14/2012    Past Surgical History:  Procedure Laterality Date  . APPENDECTOMY  1950's  . BACK SURGERY    . COLONOSCOPY    . DILATION AND CURETTAGE OF UTERUS  ~ 1975  . [place]   "had a disc removed" (10/14/2012)  . TONSILLECTOMY  1940's  . TOTAL HIP ARTHROPLASTY Right 02/26/2016   Procedure: TOTAL HIP ARTHROPLASTY ANTERIOR APPROACH;  Surgeon: Renette Butters, MD;  Location: [facility];  Service: Orthopedics;  Laterality: Right;  . TUBAL LIGATION  1960's    There were no vitals filed for this visit.      Subjective Assessment - 01/13/17 0906    Subjective Patient presents today with reports of improvement in flexibility.    Pertinent History Right total hip replacement.  Osteopenia and OA.   Patient Stated Goals Be less stiff.   Currently in Pain? Yes   Pain Score 1    Pain Location Hip   Pain Orientation Right   Pain Descriptors / Indicators Aching   Pain Onset More than a month ago   Pain Frequency Intermittent   Aggravating Factors  sit to stand (first step)   Pain Relieving Factors moving                         [facility] Adult PT Treatment/Exercise - 01/13/17 0001      Self-Care   Self-Care Other Self-Care Comments   Other Self-Care Comments  self MFR using various balls against wall and sitting; discussion of POC and progress overall     Knee/Hip Exercises: Stretches   Other Knee/Hip Stretches Reviewed HF stretch on EOB   Other Knee/Hip Stretches attempted SDLY quad stretch with strap but pt won't do due to shoulder arthritis     Manual Therapy   Manual Therapy Soft tissue mobilization;Myofascial release   Soft tissue mobilization to R gluteals, TFL, lateral quads and HS   Myofascial Release to R ITB          Trigger Point Dry Needling - 01/13/17 0947    Consent Given? Yes   Education Handout Provided No   Muscles Treated Lower Body Gluteus minimus;Gluteus maximus;Tensor fascia lata;Quadriceps  R  Gluteus Maximus Response Twitch response elicited;Palpable increased muscle length   Gluteus Minimus Response Twitch response elicited;Palpable increased muscle length   Tensor Fascia Lata Response Twitch response elicited;Palpable increased muscle length   Quadriceps Response Twitch response elicited;Palpable increased muscle length                PT Short Term Goals - 12/30/16 1337      PT SHORT TERM GOAL #1   Title STG's=LTG's.           PT Long Term Goals - 12/30/16 1337      PT LONG TERM GOAL #1   Title Patient reports a 50% reduction in stiffness.   Time 4   Period Weeks   Status New               Plan - 01/13/17 1220    Clinical Impression Statement Patient presents today with reports of improvement overall. Patient states she does not have pain with stretching anymore and this is the  first time she feels that PT is working. She plans to return by 01/27/17 for reassessment and progression of HEP. Good response to DN today. She continues to have ++ localized twitch responses in gluteals and lateral quads.    Rehab Potential Fair   PT Frequency 2x / week   PT Duration 4 weeks   PT Treatment/Interventions Moist Heat;Dry needling;Passive range of motion   PT Next Visit Plan Assess DN, continue manual to gluteals, TFL and R quads/HS; DN prn; HS, HF, ITB, quad stretching; HMP (per patient) and dry needling.   PT Home Exercise Plan supine HF and seated HS stretch   Consulted and Agree with Plan of Care Patient      Patient will benefit from skilled therapeutic intervention in order to improve the following deficits and impairments:  Decreased activity tolerance, Postural dysfunction  Visit Diagnosis: Abnormal posture     Problem List Patient Active Problem List   Diagnosis Date Noted  . Overweight (BMI 25.0-29.9) 07/21/2016  . Insomnia 07/21/2016  . Primary osteoarthritis of right hip 01/28/2016  . Aortic atherosclerosis ([place]) 01/22/2016  . Osteoarthritis 07/20/2015  . GAD (generalized anxiety disorder) 01/16/2015  . Vitamin D deficiency 01/16/2014  . Osteopenia 01/21/2013  . History of TIA (transient ischemic attack) 10/14/2012  . Hyperlipidemia 10/14/2012  . HTN (hypertension) 10/14/2012  . Leukopenia 10/14/2012    [name] PT 01/13/2017, 12:29 PM  [facility] [place], [place], [address] Phone: [phone]   Fax:  [phone]  Name: Andrea Moreno MRN: [id number] Date of Birth: May 25, 1941

## 2017-01-19 ENCOUNTER — Encounter: Payer: Self-pay | Admitting: Family

## 2017-01-19 ENCOUNTER — Ambulatory Visit (INDEPENDENT_AMBULATORY_CARE_PROVIDER_SITE_OTHER): Payer: Medicare Other | Admitting: Family

## 2017-01-19 VITALS — BP 126/72 | HR 50 | Temp 97.4°F | Ht 62.0 in | Wt 153.8 lb

## 2017-01-19 DIAGNOSIS — M8580 Other specified disorders of bone density and structure, unspecified site: Secondary | ICD-10-CM

## 2017-01-19 DIAGNOSIS — M159 Polyosteoarthritis, unspecified: Secondary | ICD-10-CM

## 2017-01-19 DIAGNOSIS — G4700 Insomnia, unspecified: Secondary | ICD-10-CM | POA: Diagnosis not present

## 2017-01-19 DIAGNOSIS — E785 Hyperlipidemia, unspecified: Secondary | ICD-10-CM

## 2017-01-19 DIAGNOSIS — E663 Overweight: Secondary | ICD-10-CM | POA: Diagnosis not present

## 2017-01-19 DIAGNOSIS — I10 Essential (primary) hypertension: Secondary | ICD-10-CM | POA: Diagnosis not present

## 2017-01-19 DIAGNOSIS — F411 Generalized anxiety disorder: Secondary | ICD-10-CM | POA: Diagnosis not present

## 2017-01-19 DIAGNOSIS — Z23 Encounter for immunization: Secondary | ICD-10-CM

## 2017-01-19 DIAGNOSIS — E559 Vitamin D deficiency, unspecified: Secondary | ICD-10-CM | POA: Diagnosis not present

## 2017-01-19 MED ORDER — LOSARTAN POTASSIUM-HCTZ 100-25 MG PO TABS: 1.0000 | ORAL_TABLET | Freq: Every day | ORAL | 3 refills | Status: DC

## 2017-01-19 MED ORDER — RALOXIFENE HCL 60 MG PO TABS: 60.0000 mg | ORAL_TABLET | Freq: Every day | ORAL | 3 refills | Status: DC

## 2017-01-19 MED ORDER — TRAZODONE HCL 50 MG PO TABS: ORAL_TABLET | ORAL | 2 refills | Status: DC

## 2017-01-19 MED ORDER — EZETIMIBE 10 MG PO TABS: 10.0000 mg | ORAL_TABLET | Freq: Every day | ORAL | 3 refills | Status: DC

## 2017-01-19 NOTE — Addendum Note (Signed)
Addended by: Shelbie Ammons on: 01/19/2017 09:34 AM   Modules accepted: Orders

## 2017-01-19 NOTE — Progress Notes (Signed)
Subjective:    Patient ID: KEONI HAVEY, female    DOB: 11/26/1940, 76 y.o.   MRN: [id number]  PT presents to the office today for chronic follow  Up.  Hypertension  This is a chronic problem. The current episode started more than 1 year ago. The problem has been resolved since onset. The problem is controlled. Associated symptoms include shortness of breath ("at times"). Pertinent negatives include no peripheral edema. Risk factors for coronary artery disease include dyslipidemia, sedentary lifestyle and family history. The current treatment provides moderate improvement. Hypertensive end-organ damage includes CAD/MI. There is no history of heart failure.  Arthritis  Presents for follow-up visit. She complains of pain and stiffness. The symptoms have been worsening. Affected locations include the right knee, left knee, left shoulder, right shoulder, left hip and right hip (back). Her pain is at a severity of 2/10.  Hyperlipidemia  This is a chronic problem. The current episode started more than 1 year ago. The problem is controlled. Recent lipid tests were reviewed and are normal. Associated symptoms include shortness of breath ("at times"). Current antihyperlipidemic treatment includes ezetimibe. The current treatment provides moderate improvement of lipids. Risk factors for coronary artery disease include dyslipidemia, family history, post-menopausal and a sedentary lifestyle.  Insomnia  Primary symptoms: difficulty falling asleep, frequent awakening.  The current episode started more than one year. The onset quality is gradual. The problem occurs rarely. The problem has been resolved since onset.  Osteopenia  PT taking calcium, Vit D, and the Evista. Last Dexa scan 10/21/16.   Review of Systems  Respiratory: Positive for shortness of breath ("at times").   Musculoskeletal: Positive for arthritis and stiffness.  Psychiatric/Behavioral: The patient has insomnia.   All other systems  reviewed and are negative.      Objective:   Physical Exam  Constitutional: She is oriented to person, place, and time. She appears well-developed and well-nourished. No distress.  HENT:  Head: Normocephalic and atraumatic.  Right Ear: External ear normal.  Left Ear: External ear normal.  Nose: Nose normal.  Mouth/Throat: Oropharynx is clear and moist.  Eyes: Pupils are equal, round, and reactive to light.  Neck: Normal range of motion. Neck supple. No thyromegaly present.  Cardiovascular: Normal rate, regular rhythm, normal heart sounds and intact distal pulses.   No murmur heard. Pulmonary/Chest: Effort normal and breath sounds normal. No respiratory distress. She has no wheezes.  Abdominal: Soft. Bowel sounds are normal. She exhibits no distension. There is no tenderness.  Musculoskeletal: Normal range of motion. She exhibits no edema or tenderness.  Neurological: She is alert and oriented to person, place, and time.  Skin: Skin is warm and dry.  Psychiatric: She has a normal mood and affect. Her behavior is normal. Judgment and thought content normal.  Vitals reviewed.    BP 126/72 (BP Location: Left Arm, Patient Position: Sitting, Cuff Size: Normal)   Pulse (!) 50   Temp (!) 97.4 F (36.3 C) (Oral)   Ht 5' 2" (1.575 m)   Wt 153 lb 12.8 oz (69.8 kg)   BMI 28.13 kg/m      Assessment & Plan:  1. Essential hypertension - CMP14+EGFR - losartan-hydrochlorothiazide (HYZAAR) 100-25 MG tablet; Take 1 tablet by mouth daily.  Dispense: 90 tablet; Refill: 3  2. Osteopenia, unspecified location - CMP14+EGFR - VITAMIN D 25 Hydroxy (Vit-D Deficiency, Fractures) - raloxifene (EVISTA) 60 MG tablet; Take 1 tablet (60 mg total) by mouth daily.  Dispense: 90 tablet; Refill: 3  3. Osteoarthritis of multiple joints, unspecified osteoarthritis type - CMP14+EGFR  4. Insomnia, unspecified type  - CMP14+EGFR - traZODone (DESYREL) 50 MG tablet; TAKE 1 OR 2 TABLETS AT BEDTIME AS NEEDED  FOR SLEEP  Dispense: 180 tablet; Refill: 2  5. Hyperlipidemia, unspecified hyperlipidemia typ - CMP14+EGFR - Lipid panel - ezetimibe (ZETIA) 10 MG tablet; Take 1 tablet (10 mg total) by mouth daily.  Dispense: 90 tablet; Refill: 3  6. GAD (generalized anxiety disorder) - CMP14+EGFR  7. Overweight (BMI 25.0-29.9) - CMP14+EGFR  8. Vitamin D deficiency - CMP14+EGFR - VITAMIN D 25 Hydroxy (Vit-D Deficiency, Fractures)   Continue all meds Labs pending Health Maintenance reviewed Diet and exercise encouraged RTO 6 months   Evelina Dun, FNP

## 2017-01-19 NOTE — Patient Instructions (Signed)

## 2017-01-20 LAB — CMP14+EGFR
A/G RATIO: 2 (ref 1.2–2.2)
ALK PHOS: 35 IU/L — AB (ref 39–117)
ALT: 13 IU/L (ref 0–32)
AST: 17 IU/L (ref 0–40)
Albumin: 4.1 g/dL (ref 3.5–4.8)
BILIRUBIN TOTAL: 0.4 mg/dL (ref 0.0–1.2)
BUN/Creatinine Ratio: 22 (ref 12–28)
BUN: 18 mg/dL (ref 8–27)
CALCIUM: 9 mg/dL (ref 8.7–10.3)
CHLORIDE: 104 mmol/L (ref 96–106)
CO2: 24 mmol/L (ref 20–29)
Creatinine, Ser: 0.82 mg/dL (ref 0.57–1.00)
GFR calc Af Amer: 80 mL/min/{1.73_m2} (ref >59)
GFR calc non Af Amer: 70 mL/min/{1.73_m2} (ref >59)
GLUCOSE: 99 mg/dL (ref 65–99)
Globulin, Total: 2.1 g/dL (ref 1.5–4.5)
POTASSIUM: 4.2 mmol/L (ref 3.5–5.2)
SODIUM: 141 mmol/L (ref 134–144)
Total Protein: 6.2 g/dL (ref 6.0–8.5)

## 2017-01-20 LAB — LIPID PANEL
CHOLESTEROL TOTAL: 159 mg/dL (ref 100–199)
Chol/HDL Ratio: 2.7 ratio (ref 0.0–4.4)
HDL: 58 mg/dL (ref >39)
LDL CALC: 81 mg/dL (ref 0–99)
Triglycerides: 100 mg/dL (ref 0–149)
VLDL Cholesterol Cal: 20 mg/dL (ref 5–40)

## 2017-01-20 LAB — VITAMIN D 25 HYDROXY (VIT D DEFICIENCY, FRACTURES): Vit D, 25-Hydroxy: 52.9 ng/mL (ref 30.0–100.0)

## 2017-01-27 ENCOUNTER — Ambulatory Visit: Payer: Medicare Other | Admitting: Physical Therapy

## 2017-01-27 NOTE — Therapy (Deleted)
[facility] [place], [place], [address] Phone: [phone]   Fax:  [phone]  Patient Details  Name: Andrea Moreno MRN: [id number] Date of Birth: January 29, 1941 Referring Provider:  Sharion Balloon, FNP  Encounter Date: 01/27/2017   [name] 01/27/2017, 9:59 AM  [facility] [place], [place], [address] Phone: [phone]   Fax:  [phone]

## 2017-01-27 NOTE — Therapy (Signed)
[facility] [place], [place], [address] Phone: [phone]   Fax:  [phone]  Physical Therapy Treatment  Patient Details  Name: ADIA CRAMMER MRN: [id number] Date of Birth: [birth year] Referring Provider: Leigh Aurora MD  Encounter Date: 01/27/2017      PT End of Session - 01/27/17 0948    Visit Number 4   Number of Visits 8   Date for PT Re-Evaluation 01/27/17   PT Start Time 0948   PT Stop Time 0955   PT Time Calculation (min) 7 min      Past Medical History:  Diagnosis Date  . Allergy   . Arthritis    "back, right shoulder, hips" (10/14/2012)  . Cataract   . Complication of anesthesia    "1950's had appendix out; kicked the RN; no problems since" (10/14/2012)  . Heart murmur    normal Echo 2014 per pt  . History of migraine    1960s  . Hyperlipidemia   . Hypertension   . Osteopenia   . Pneumonia    "couple of times; last time in the mid 1990's" (10/14/2012)  . TIA (transient ischemic attack) 10/14/2012    Past Surgical History:  Procedure Laterality Date  . APPENDECTOMY  1950's  . BACK SURGERY    . COLONOSCOPY    . DILATION AND CURETTAGE OF UTERUS  ~ 1975  . [place]   "had a disc removed" (10/14/2012)  . TONSILLECTOMY  1940's  . TOTAL HIP ARTHROPLASTY Right 02/26/2016   Procedure: TOTAL HIP ARTHROPLASTY ANTERIOR APPROACH;  Surgeon: Renette Butters, MD;  Location: [facility];  Service: Orthopedics;  Laterality: Right;  . TUBAL LIGATION  1960's    There were no vitals filed for this visit.      Subjective Assessment - 01/27/17 0958    Subjective Patient reports she has no pain in her hip.   Currently in Pain? No/denies                                   PT Short Term Goals - 12/30/16 1337      PT SHORT TERM GOAL #1   Title STG's=LTG's.           PT Long Term Goals - 01/27/17 0948      PT LONG TERM GOAL #1   Title Patient reports a 50%  reduction in pain (revised from stiffness)   Time 4   Period Weeks   Status Achieved          Patient presented today for D/C visit, but did not want any treatment due to being pleased with reduction in pain and with her current HEP.   PHYSICAL THERAPY DISCHARGE SUMMARY  Visits from Start of Care: 3  Current functional level related to goals / functional outcomes: Patient reports she is able to do all the things she wants to do without hip pain.   Remaining deficits: Ongoing stiffness from arthritis which patient reports is acceptable to her.   Education / Equipment: HEP: stretching and self MFR Plan: Patient agrees to discharge.  Patient goals were met. Patient is being discharged due to being pleased with the current functional level.  ?????           Patient will benefit from skilled therapeutic intervention in order to improve the following deficits and impairments:     Visit Diagnosis: Abnormal posture  G-Codes - 01/27/17 3149    Functional Assessment Tool Used (Outpatient Only) Clinical judgement.   Functional Limitation Mobility: Walking and moving around   Mobility: Walking and Moving Around Goal Status [phone]) At least 1 percent but less than 20 percent impaired, limited or restricted      Problem List Patient Active Problem List   Diagnosis Date Noted  . Overweight (BMI 25.0-29.9) 07/21/2016  . Insomnia 07/21/2016  . Primary osteoarthritis of right hip 01/28/2016  . Aortic atherosclerosis ([place]) 01/22/2016  . Osteoarthritis 07/20/2015  . GAD (generalized anxiety disorder) 01/16/2015  . Vitamin D deficiency 01/16/2014  . Osteopenia 01/21/2013  . History of TIA (transient ischemic attack) 10/14/2012  . Hyperlipidemia 10/14/2012  . HTN (hypertension) 10/14/2012  . Leukopenia 10/14/2012    [name] PT 01/27/2017, 10:17 AM  [facility] [address] [place], [place], [address] Phone:  [phone]   Fax:  [phone]  Name: GUY TONEY MRN: [id number] Date of Birth: 1941-02-04

## 2017-02-18 ENCOUNTER — Telehealth: Payer: Self-pay | Admitting: Family

## 2017-02-18 NOTE — Telephone Encounter (Signed)
Patient aware that she may get shingrix and influenza vaccines on the same day.

## 2017-03-06 DIAGNOSIS — Z23 Encounter for immunization: Secondary | ICD-10-CM | POA: Diagnosis not present

## 2017-03-23 ENCOUNTER — Ambulatory Visit (INDEPENDENT_AMBULATORY_CARE_PROVIDER_SITE_OTHER): Payer: Medicare Other | Admitting: *Deleted

## 2017-03-23 DIAGNOSIS — Z23 Encounter for immunization: Secondary | ICD-10-CM

## 2017-03-23 NOTE — Progress Notes (Signed)
Pt given 2nd Shingrix vaccine Tolerated well 

## 2017-05-20 ENCOUNTER — Ambulatory Visit (INDEPENDENT_AMBULATORY_CARE_PROVIDER_SITE_OTHER): Payer: Medicare Other | Admitting: Family

## 2017-05-20 ENCOUNTER — Encounter: Payer: Self-pay | Admitting: Family

## 2017-05-20 VITALS — BP 128/78 | HR 78 | Temp 97.8°F | Ht 62.0 in | Wt 156.0 lb

## 2017-05-20 DIAGNOSIS — M79604 Pain in right leg: Secondary | ICD-10-CM | POA: Diagnosis not present

## 2017-05-20 DIAGNOSIS — M79605 Pain in left leg: Secondary | ICD-10-CM

## 2017-05-20 DIAGNOSIS — M159 Polyosteoarthritis, unspecified: Secondary | ICD-10-CM

## 2017-05-20 DIAGNOSIS — M2550 Pain in unspecified joint: Secondary | ICD-10-CM | POA: Diagnosis not present

## 2017-05-20 DIAGNOSIS — M150 Primary generalized (osteo)arthritis: Secondary | ICD-10-CM

## 2017-05-20 MED ORDER — GABAPENTIN 100 MG PO CAPS: ORAL_CAPSULE | ORAL | 3 refills | Status: DC

## 2017-05-20 NOTE — Progress Notes (Signed)
   Subjective:    Patient ID: Andrea Moreno, female    DOB: 10-08-1940, 76 y.o.   MRN: [id number]  PT presents to the office today with pain in bilateral legs and generalized joint pain.  Arthritis  Presents for follow-up visit. She complains of pain and stiffness. She reports no joint warmth. The symptoms have been worsening. Affected locations include the right MCP, left MCP and right hip. Her pain is at a severity of 8/10. Associated symptoms include pain at night.      Review of Systems  Musculoskeletal: Positive for arthralgias, arthritis, neck pain and stiffness.  All other systems reviewed and are negative.      Objective:   Physical Exam  Constitutional: She is oriented to person, place, and time. She appears well-developed and well-nourished. No distress.  HENT:  Head: Normocephalic.  Cardiovascular: Normal rate, regular rhythm, normal heart sounds and intact distal pulses.  No murmur heard. Pulmonary/Chest: Effort normal and breath sounds normal. No respiratory distress. She has no wheezes.  Abdominal: Soft. Bowel sounds are normal. She exhibits no distension. There is no tenderness.  Musculoskeletal: Normal range of motion. She exhibits tenderness. She exhibits no edema.  Tenderness in lower back with flexion and extensioin  Neurological: She is alert and oriented to person, place, and time.  Skin: Skin is warm and dry.  Psychiatric: She has a normal mood and affect. Her behavior is normal. Judgment and thought content normal.  Vitals reviewed.     Temp 97.8 F (36.6 C) (Oral)   Ht 5\' 2"  (1.575 m)   Wt 156 lb (70.8 kg)   BMI 28.53 kg/m      Assessment & Plan:  1. Generalized joint pain - Arthritis Panel  2. Primary osteoarthritis involving multiple joints - Arthritis Panel  3. Pain in both lower extremities - Arthritis Panel - gabapentin (NEURONTIN) 100 MG capsule; Take 100 mg AM, 100 mg afternoon, and 300 mg at bedtime  Dispense: 150 capsule;  Refill: 3   Will do arthritis panel today to rule out Rheumatoid arthritis, but I do not believe this is RA.  I think her bilateral leg pain is neuropathic pain and will restart gabapentin. Pt states she has taken this in the past and has worked Engineer, manufacturing. Pt states she does want to become addicted and wants the lowest dose.   Evelina Dun, FNP

## 2017-05-20 NOTE — Patient Instructions (Signed)

## 2017-05-21 LAB — ARTHRITIS PANEL
BASOS ABS: 0 10*3/uL (ref 0.0–0.2)
BASOS: 1 %
EOS (ABSOLUTE): 0.1 10*3/uL (ref 0.0–0.4)
Eos: 2 %
Hematocrit: 37.8 % (ref 34.0–46.6)
Hemoglobin: 12.2 g/dL (ref 11.1–15.9)
IMMATURE GRANULOCYTES: 0 %
Immature Grans (Abs): 0 10*3/uL (ref 0.0–0.1)
Lymphocytes Absolute: 1.2 10*3/uL (ref 0.7–3.1)
Lymphs: 30 %
MCH: 29.8 pg (ref 26.6–33.0)
MCHC: 32.3 g/dL (ref 31.5–35.7)
MCV: 92 fL (ref 79–97)
Monocytes Absolute: 0.3 10*3/uL (ref 0.1–0.9)
Monocytes: 8 %
NEUTROS PCT: 59 %
Neutrophils Absolute: 2.2 10*3/uL (ref 1.4–7.0)
PLATELETS: 202 10*3/uL (ref 150–379)
RBC: 4.1 x10E6/uL (ref 3.77–5.28)
RDW: 13.8 % (ref 12.3–15.4)
Sed Rate: 2 mm/hr (ref 0–40)
URIC ACID: 3.6 mg/dL (ref 2.5–7.1)
WBC: 3.8 10*3/uL (ref 3.4–10.8)

## 2017-06-29 DIAGNOSIS — M180 Bilateral primary osteoarthritis of first carpometacarpal joints: Secondary | ICD-10-CM | POA: Diagnosis not present

## 2017-06-29 DIAGNOSIS — M79641 Pain in right hand: Secondary | ICD-10-CM | POA: Diagnosis not present

## 2017-07-23 ENCOUNTER — Ambulatory Visit: Payer: Medicare Other | Admitting: Family

## 2017-07-23 ENCOUNTER — Other Ambulatory Visit: Payer: Self-pay | Admitting: Family

## 2017-07-23 DIAGNOSIS — E785 Hyperlipidemia, unspecified: Secondary | ICD-10-CM

## 2017-07-28 ENCOUNTER — Ambulatory Visit (INDEPENDENT_AMBULATORY_CARE_PROVIDER_SITE_OTHER): Payer: Medicare Other | Admitting: Family

## 2017-07-28 ENCOUNTER — Encounter: Payer: Self-pay | Admitting: Family

## 2017-07-28 VITALS — BP 135/76 | HR 51 | Temp 96.8°F | Ht 62.0 in | Wt 156.6 lb

## 2017-07-28 DIAGNOSIS — I700 Atherosclerosis of aorta: Secondary | ICD-10-CM

## 2017-07-28 DIAGNOSIS — M8580 Other specified disorders of bone density and structure, unspecified site: Secondary | ICD-10-CM | POA: Diagnosis not present

## 2017-07-28 DIAGNOSIS — M159 Polyosteoarthritis, unspecified: Secondary | ICD-10-CM | POA: Diagnosis not present

## 2017-07-28 DIAGNOSIS — E663 Overweight: Secondary | ICD-10-CM | POA: Diagnosis not present

## 2017-07-28 DIAGNOSIS — Z1211 Encounter for screening for malignant neoplasm of colon: Secondary | ICD-10-CM | POA: Diagnosis not present

## 2017-07-28 DIAGNOSIS — I10 Essential (primary) hypertension: Secondary | ICD-10-CM

## 2017-07-28 DIAGNOSIS — E785 Hyperlipidemia, unspecified: Secondary | ICD-10-CM | POA: Diagnosis not present

## 2017-07-28 DIAGNOSIS — F411 Generalized anxiety disorder: Secondary | ICD-10-CM

## 2017-07-28 LAB — CMP14+EGFR
A/G RATIO: 1.6 (ref 1.2–2.2)
ALT: 15 IU/L (ref 0–32)
AST: 15 IU/L (ref 0–40)
Albumin: 3.9 g/dL (ref 3.5–4.8)
Alkaline Phosphatase: 47 IU/L (ref 39–117)
BILIRUBIN TOTAL: 0.5 mg/dL (ref 0.0–1.2)
BUN/Creatinine Ratio: 25 (ref 12–28)
BUN: 20 mg/dL (ref 8–27)
CO2: 24 mmol/L (ref 20–29)
Calcium: 9.5 mg/dL (ref 8.7–10.3)
Chloride: 103 mmol/L (ref 96–106)
Creatinine, Ser: 0.79 mg/dL (ref 0.57–1.00)
GFR, EST AFRICAN AMERICAN: 84 mL/min/{1.73_m2} (ref >59)
GFR, EST NON AFRICAN AMERICAN: 73 mL/min/{1.73_m2} (ref >59)
GLOBULIN, TOTAL: 2.5 g/dL (ref 1.5–4.5)
Glucose: 94 mg/dL (ref 65–99)
Potassium: 3.7 mmol/L (ref 3.5–5.2)
SODIUM: 142 mmol/L (ref 134–144)
TOTAL PROTEIN: 6.4 g/dL (ref 6.0–8.5)

## 2017-07-28 LAB — LIPID PANEL
Chol/HDL Ratio: 2.6 ratio (ref 0.0–4.4)
Cholesterol, Total: 157 mg/dL (ref 100–199)
HDL: 61 mg/dL (ref >39)
LDL CALC: 75 mg/dL (ref 0–99)
TRIGLYCERIDES: 107 mg/dL (ref 0–149)
VLDL CHOLESTEROL CAL: 21 mg/dL (ref 5–40)

## 2017-07-28 MED ORDER — RALOXIFENE HCL 60 MG PO TABS: 60.0000 mg | ORAL_TABLET | Freq: Every day | ORAL | 3 refills | Status: DC

## 2017-07-28 MED ORDER — EZETIMIBE 10 MG PO TABS: 10.0000 mg | ORAL_TABLET | Freq: Every day | ORAL | 3 refills | Status: DC

## 2017-07-28 NOTE — Progress Notes (Signed)
Subjective:    Patient ID: Andrea Moreno, female    DOB: [birth year], 77 y.o.   MRN: [id number]  Pt presents to the office today for chronic follow up.  Hypertension  This is a chronic problem. The current episode started more than 1 year ago. The problem has been resolved since onset. The problem is controlled. Associated symptoms include anxiety and shortness of breath ("when I walk up hill"). Pertinent negatives include no malaise/fatigue or peripheral edema. Risk factors for coronary artery disease include dyslipidemia, obesity, sedentary lifestyle and family history. The current treatment provides moderate improvement. Hypertensive end-organ damage includes CAD/MI. There is no history of CVA or heart failure.  Arthritis  Presents for follow-up visit. She complains of pain and stiffness. The symptoms have been stable. Affected locations include the right knee and left knee. Her pain is at a severity of 4/10.  Hyperlipidemia  This is a chronic problem. The current episode started more than 1 year ago. The problem is controlled. Recent lipid tests were reviewed and are normal. Associated symptoms include shortness of breath ("when I walk up hill"). Current antihyperlipidemic treatment includes ezetimibe. The current treatment provides moderate improvement of lipids. Risk factors for coronary artery disease include hypertension and dyslipidemia.  Insomnia  Primary symptoms: difficulty falling asleep, frequent awakening, no malaise/fatigue.  The current episode started more than one year. The onset quality is gradual. The treatment provided moderate relief.  Anxiety  Presents for follow-up visit. Symptoms include depressed mood, excessive worry, insomnia, nervous/anxious behavior and shortness of breath ("when I walk up hill"). Symptoms occur most days. The severity of symptoms is mild.    Osteopenia PT currently taking Evista 60 mg daily. Last Dexascan 10/21/16.     Review of Systems    Constitutional: Negative for malaise/fatigue.  Respiratory: Positive for shortness of breath ("when I walk up hill").   Musculoskeletal: Positive for arthritis and stiffness.  Psychiatric/Behavioral: The patient is nervous/anxious and has insomnia.   All other systems reviewed and are negative.      Objective:   Physical Exam  Constitutional: She is oriented to person, place, and time. She appears well-developed and well-nourished. No distress.  HENT:  Head: Normocephalic and atraumatic.  Right Ear: External ear normal.  Mouth/Throat: Oropharynx is clear and moist.  Eyes: Pupils are equal, round, and reactive to light.  Neck: Normal range of motion. Neck supple. No thyromegaly present.  Cardiovascular: Normal rate, regular rhythm, normal heart sounds and intact distal pulses.  No murmur heard. Pulmonary/Chest: Effort normal and breath sounds normal. No respiratory distress. She has no wheezes.  Abdominal: Soft. Bowel sounds are normal. She exhibits no distension. There is no tenderness.  Musculoskeletal: Normal range of motion. She exhibits no edema or tenderness.  Neurological: She is alert and oriented to person, place, and time.  Skin: Skin is warm and dry.  Psychiatric: She has a normal mood and affect. Her behavior is normal. Judgment and thought content normal.  Vitals reviewed.    BP 135/76   Pulse (!) 51   Temp (!) 96.8 F (36 C) (Oral)   Ht '5\' 2"'  (1.575 m)   Wt 156 lb 9.6 oz (71 kg)   BMI 28.64 kg/m      Assessment & Plan:  1. Essential hypertension - CMP14+EGFR  2. Aortic atherosclerosis (HCC) - CMP14+EGFR  3. Osteoarthritis of multiple joints, unspecified osteoarthritis type - CMP14+EGFR  4. Hyperlipidemia, unspecified hyperlipidemia type Pt will hold Zetia and recheck LDL in 6 months  if she has to self pay for medication  - ezetimibe (ZETIA) 10 MG tablet; Take 1 tablet (10 mg total) by mouth daily.  Dispense: 90 tablet; Refill: 3 - CMP14+EGFR - Lipid  panel  5. Overweight (BMI 25.0-29.9)  - CMP14+EGFR  6. GAD (generalized anxiety disorder) - CMP14+EGFR  7. Osteopenia, unspecified location - raloxifene (EVISTA) 60 MG tablet; Take 1 tablet (60 mg total) by mouth daily.  Dispense: 90 tablet; Refill: 3 - CMP14+EGFR  8. Colon cancer screening  - Fecal occult blood, imunochemical; Future   Continue all meds Labs pending Health Maintenance reviewed Diet and exercise encouraged RTO 6 months   Evelina Dun, FNP

## 2017-07-28 NOTE — Patient Instructions (Signed)

## 2017-08-14 DIAGNOSIS — W540XXA Bitten by dog, initial encounter: Secondary | ICD-10-CM | POA: Diagnosis not present

## 2017-08-14 DIAGNOSIS — S61512A Laceration without foreign body of left wrist, initial encounter: Secondary | ICD-10-CM | POA: Diagnosis not present

## 2017-08-17 ENCOUNTER — Ambulatory Visit: Payer: Medicare Other

## 2017-09-21 ENCOUNTER — Ambulatory Visit (INDEPENDENT_AMBULATORY_CARE_PROVIDER_SITE_OTHER): Payer: Medicare Other | Admitting: Family

## 2017-09-21 ENCOUNTER — Encounter: Payer: Self-pay | Admitting: Family

## 2017-09-21 VITALS — BP 128/74 | HR 55 | Temp 97.8°F | Ht 62.0 in | Wt 155.6 lb

## 2017-09-21 DIAGNOSIS — W57XXXA Bitten or stung by nonvenomous insect and other nonvenomous arthropods, initial encounter: Secondary | ICD-10-CM

## 2017-09-21 DIAGNOSIS — S70922A Unspecified superficial injury of left thigh, initial encounter: Secondary | ICD-10-CM | POA: Diagnosis not present

## 2017-09-21 MED ORDER — DOXYCYCLINE HYCLATE 100 MG PO TABS: 200.0000 mg | ORAL_TABLET | Freq: Once | ORAL | 0 refills | Status: AC

## 2017-09-21 NOTE — Patient Instructions (Signed)

## 2017-09-21 NOTE — Progress Notes (Signed)
   Subjective:    Patient ID: Andrea Moreno, female    DOB: 07-Mar-1941, 77 y.o.   MRN: [id number]  HPI PT presents to the office today after a tick bite. PT states she saw the tick on her left inner thigh on 09/16/17 and removed. She reports the area is red and bruised. She denies any fever, new joint pain, or headache.    Review of Systems  Skin: Positive for rash.  All other systems reviewed and are negative.      Objective:   Physical Exam  Constitutional: She is oriented to person, place, and time. She appears well-developed and well-nourished. No distress.  HENT:  Head: Normocephalic.  Eyes: Pupils are equal, round, and reactive to light.  Neck: Normal range of motion. Neck supple. No thyromegaly present.  Cardiovascular: Normal rate, regular rhythm, normal heart sounds and intact distal pulses.  No murmur heard. Pulmonary/Chest: Effort normal and breath sounds normal. No respiratory distress. She has no wheezes.  Abdominal: Soft. Bowel sounds are normal. She exhibits no distension. There is no tenderness.  Musculoskeletal: Normal range of motion. She exhibits no edema or tenderness.  Neurological: She is alert and oriented to person, place, and time.  Skin: Skin is warm and dry. Rash noted.  Left medial thigh, erythemas scabbed area that is approx 0.4X0.3cm that has healing ecchymosis extending around area approx 4.5X4 cm  Psychiatric: She has a normal mood and affect. Her behavior is normal. Judgment and thought content normal.  Vitals reviewed.     BP 128/74   Pulse (!) 55   Temp 97.8 F (36.6 C) (Oral)   Ht 5\' 2"  (1.575 m)   Wt 155 lb 9.6 oz (70.6 kg)   BMI 28.46 kg/m      Assessment & Plan:  1. Tick bite, initial encounter -Pt to report any new fever, joint pain, or rash -Wear protective clothing while outside- Long sleeves and long pants -Put insect repellent on all exposed skin and along clothing -Take a shower as soon as possible after being  outside RTO prn  - doxycycline (VIBRA-TABS) 100 MG tablet; Take 2 tablets (200 mg total) by mouth once for 1 dose.  Dispense: 2 tablet; Refill: 0    Evelina Dun, FNP

## 2017-10-08 ENCOUNTER — Other Ambulatory Visit: Payer: Self-pay | Admitting: Family

## 2017-10-08 DIAGNOSIS — M79605 Pain in left leg: Principal | ICD-10-CM

## 2017-10-08 DIAGNOSIS — M79604 Pain in right leg: Secondary | ICD-10-CM

## 2017-10-22 ENCOUNTER — Ambulatory Visit: Payer: Medicare Other | Admitting: *Deleted

## 2017-10-22 ENCOUNTER — Encounter: Payer: Self-pay | Admitting: *Deleted

## 2017-10-22 VITALS — BP 110/66 | HR 56 | Ht 61.0 in | Wt 156.0 lb

## 2017-10-22 DIAGNOSIS — Z1211 Encounter for screening for malignant neoplasm of colon: Secondary | ICD-10-CM

## 2017-10-22 DIAGNOSIS — Z0000 Encounter for general adult medical examination without abnormal findings: Secondary | ICD-10-CM

## 2017-10-22 NOTE — Progress Notes (Addendum)
Subjective:   Andrea Moreno is a 77 y.o. female who presents for a subsequent Medicare Annual Wellness Visit. 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 They enjoy gardening and she loves to read and do puzzles.   Review of Systems    Arthritis is worse than last year. Pain during the night but mostly stiffness during the day. Takes Tylenol Arthritis for the pain at night.   Cardiac Risk Factors include: advanced age (>77men, >8 women);family history of premature cardiovascular disease;hypertension;dyslipidemia     Objective:    Today's Vitals   10/22/17 0858 10/22/17 0901  BP: 110/66   Pulse: (!) 56   Weight: 156 lb (70.8 kg)   Height: 5\' 1"  (1.549 m)   PainSc:  5    Body mass index is 29.48 kg/m.  Advanced Directives 10/22/2017 02/26/2016 02/15/2016 09/20/2014 10/14/2012  Does Patient Have a Medical Advance Directive? Yes Yes Yes Yes Patient has advance directive, copy not in chart  Type of Advance Directive [place];Living will [place];Living will Healthcare Power of [place] [facility] [place];Living will  Does patient want to make changes to medical advance directive? No - Patient declined No - Patient declined - No - Patient declined -  Copy of [place] in Chart? Yes Yes No - copy requested No - copy requested -    Current Medications (verified) Outpatient Encounter Medications as of 10/22/2017  Medication Sig  . acetaminophen (TYLENOL) 650 MG CR tablet Take 650 mg by mouth at bedtime.  Marland Kitchen aspirin 325 MG EC tablet Take 325 mg by mouth daily.  . Calcium Citrate (CITRACAL PO) Take 600 mg by mouth 2 (two) times daily.  Marland Kitchen ezetimibe (ZETIA) 10 MG tablet Take 1 tablet (10 mg total) by mouth daily.  . fish oil-omega-3 fatty acids 1000 MG capsule Take 1 g by mouth daily.   Marland Kitchen gabapentin (NEURONTIN) 100 MG capsule TAKE 1 CAPSULE EVERY MORNING &  AFTERNOON & 3 CAPSULES AT BEDTIME (Patient taking differently: Take 400 mg by mouth at bedtime. )  . losartan-hydrochlorothiazide (HYZAAR) 100-25 MG tablet Take 1 tablet by mouth daily.  . Multiple Vitamin (MULTIVITAMIN) capsule Take 1 capsule by mouth daily.  . raloxifene (EVISTA) 60 MG tablet Take 1 tablet (60 mg total) by mouth daily.   No facility-administered encounter medications on file as of 10/22/2017.     Allergies (verified) Celebrex [celecoxib]; Demerol [meperidine]; Lisinopril; Statins; and Zyrtec [cetirizine]   History: Past Medical History:  Diagnosis Date  . Allergy   . Arthritis    "back, right shoulder, hips" (10/14/2012)  . Cataract   . Complication of anesthesia    "1950's had appendix out; kicked the RN; no problems since" (10/14/2012)  . Heart murmur    normal Echo 2014 per pt  . History of migraine    1960s  . Hyperlipidemia   . Hypertension   . Osteopenia   . Pneumonia    "couple of times; last time in the mid 1990's" (10/14/2012)  . TIA (transient ischemic attack) 10/14/2012   Past Surgical History:  Procedure Laterality Date  . APPENDECTOMY  1950's  . BACK SURGERY    . COLONOSCOPY    . DILATION AND CURETTAGE OF UTERUS  ~ 1975  . [place]   "had a disc removed" (10/14/2012)  . TONSILLECTOMY  1940's  . TOTAL HIP ARTHROPLASTY Right 02/26/2016  Procedure: TOTAL HIP ARTHROPLASTY ANTERIOR APPROACH;  Surgeon: Renette Butters, MD;  Location: [facility];  Service: Orthopedics;  Laterality: Right;  . TUBAL LIGATION  1960's   Family History  Problem Relation Age of Onset  . Heart disease Mother   . Diabetes Mother   . Arthritis Mother   . Vascular Disease Mother   . Diabetes Sister   . Hypertension Sister   . Arthritis Sister   . Early death Brother   . Vascular Disease Brother   . Heart attack Brother   . Arthritis Father   . Heart attack Brother   . Diabetes Sister   . Hypertension Sister   . Arthritis Sister    Social History    Socioeconomic History  . Marital status: Significant Other    Spouse name: Not on file  . Number of children: 0  . Years of education: 16  . Highest education level: Bachelor's degree (e.g., BA, AB, BS)  Occupational History  . Occupation: Retired    Comment: occupational therapy  Social Needs  . Financial resource strain: Not hard at all  . Food insecurity:    Worry: Never true    Inability: Never true  . Transportation needs:    Medical: No    Non-medical: No  Tobacco Use  . Smoking status: Former Smoker    Packs/day: 0.30    Years: 5.00    Pack years: 1.50    Types: Cigarettes    Last attempt to quit: 08/31/1985    Years since quitting: 32.1  . Smokeless tobacco: Never Used  Substance and Sexual Activity  . Alcohol use: Yes    Alcohol/week: 0.6 oz    Types: 1 Cans of beer per week    Comment: 10/14/2012 "~ 6 oz wine/day", 3 times a month (9/17)  . Drug use: No  . Sexual activity: Not Currently  Lifestyle  . Physical activity:    Days per week: 7 days    Minutes per session: 90 min  . Stress: To some extent  Relationships  . Social connections:    Talks on phone: More than three times a week    Gets together: More than three times a week    Attends religious service: Never    Active member of club or organization: No    Attends meetings of clubs or organizations: Never    Relationship status: Living with partner  Other Topics Concern  . Not on file  Social History Narrative  . Not on file    Tobacco Counseling No tobacco use  Clinical Intake:  Pain : No/denies pain Pain Score: 5  Pain Type: Chronic pain Pain Location: Generalized(joints) Pain Descriptors / Indicators: Aching Pain Onset: More than a month ago Pain Frequency: Constant Pain Relieving Factors: tylenol arthritis Effect of Pain on Daily Activities: none  Pain Relieving Factors: tylenol arthritis  Nutritional Status: BMI > 30  Obese Diabetes: No  How often do you need to have  someone help you when you read instructions, pamphlets, or other written materials from your doctor or pharmacy?: 4 - Often What is the last grade level you completed in school?: bachelor's in occupational therapy     Information entered by :: Chong Sicilian, RN   Activities of Daily Living In your present state of health, do you have any difficulty performing the following activities: 10/22/2017  Hearing? Y  Comment normally wears hearing aids   Vision? N  Comment Sees eye dr regularly  Difficulty concentrating  or making decisions? N  Walking or climbing stairs? N  Comment some difficulty with walking due to arthritis  Dressing or bathing? N  Doing errands, shopping? N  Preparing Food and eating ? N  Using the Toilet? N  In the past six months, have you accidently leaked urine? N  Do you have problems with loss of bowel control? N  Managing your Medications? N  Managing your Finances? N  Housekeeping or managing your Housekeeping? N  Some recent data might be hidden     Immunizations and Health Maintenance Immunization History  Administered Date(s) Administered  . Influenza, High Dose Seasonal PF 03/09/2013, 02/15/2014, 03/20/2015  . Influenza-Unspecified 03/14/2016  . Pneumococcal Conjugate-13 01/16/2015  . Pneumococcal Polysaccharide-23 10/15/2012  . Tdap 08/10/2013  . Zoster 06/02/2004  . Zoster Recombinat (Shingrix) 01/19/2017, 03/23/2017   There are no preventive care reminders to display for this patient.  Patient Care Team: Sharion Balloon, FNP as PCP - General (Nurse Practitioner) Almedia Balls, MD as Consulting Physician (Orthopedic Surgery) Williams Che, MD (Inactive) as Consulting Physician (Ophthalmology)  No hospitalizations, ER visits, or surgeries this past year.       Assessment:   This is a routine wellness examination for Zaydah.  Hearing/Vision screen No deficits noted during visit.  Dietary issues and exercise activities  discussed: Diet Fixes 3 meals a day Drinks water as needed throughout the day  Current Exercise Habits: Home exercise routine(works around home and garden. Does not sit around house), Type of exercise: walking, Time (Minutes): 60, Frequency (Times/Week): 7, Weekly Exercise (Minutes/Week): 420, Intensity: Mild, Exercise limited by: orthopedic condition(s)  Goals    . Exercise 150 min/wk Moderate Activity      Depression Screen PHQ 2/9 Scores 10/22/2017 09/21/2017 07/28/2017 05/20/2017 01/19/2017 10/21/2016 07/21/2016  PHQ - 2 Score 0 0 0 0 0 0 0    Fall Risk Fall Risk  10/22/2017 09/21/2017 07/28/2017 05/20/2017 01/19/2017  Falls in the past year? Yes Yes No No No  Number falls in past yr: 1 1 - - -  Comment - - - - -  Injury with Fall? No No - - -  Risk for fall due to : History of fall(s) - - - -  Follow up Falls prevention discussed - - - -    Is the patient's home free of loose throw rugs in walkways, pet beds, electrical cords, etc?   yes      Grab bars in the bathroom? no      Handrails on the stairs?   yes      Adequate lighting?   yes   Cognitive Function: MMSE - Mini Mental State Exam 10/22/2017 10/21/2016 09/20/2014  Orientation to time 5 5 5   Orientation to Place 5 5 5   Registration 3 3 3   Attention/ Calculation 5 3 5   Recall 3 3 2   Language- name 2 objects 2 2 2   Language- repeat 1 1 1   Language- follow 3 step command 3 3 3   Language- read & follow direction 1 1 1   Write a sentence 1 1 1   Copy design 1 1 1   Total score 30 28 29         Screening Tests Health Maintenance  Topic Date Due  . INFLUENZA VACCINE  12/31/2017  . TETANUS/TDAP  08/11/2023  . DEXA SCAN  Completed  . PNA vac Low Risk Adult  Completed    Cancer Screenings: Lung: Low Dose CT Chest recommended if Age 59-80 years, 30 pack-year  currently smoking OR have quit w/in 15years. Patient does not qualify. Breast: Up to date on Mammogram? Not recommended Up to date of Bone Density/Dexa?  Yes Colorectal: FOBT turned in today     Plan:  Keep f/u with PCP Continue to stay active. Aim for at least 150 minutes a week of moderate activity.  Move carefully to avoid falls  I have personally reviewed and noted the following in the patient's chart:   . Medical and social history . Use of alcohol, tobacco or illicit drugs  . Current medications and supplements . Functional ability and status . Nutritional status . Physical activity . Advanced directives . List of other physicians . Hospitalizations, surgeries, and ER visits in previous 12 months . Vitals . Screenings to include cognitive, depression, and falls . Referrals and appointments  In addition, I have reviewed and discussed with patient certain preventive protocols, quality metrics, and best practice recommendations. A written personalized care plan for preventive services as well as general preventive health recommendations were provided to patient.     Chong Sicilian, RN   10/22/2017   I have reviewed and agree with the above AWV documentation.   Evelina Dun, FNP

## 2017-10-22 NOTE — Patient Instructions (Addendum)
  Andrea Moreno , Thank you for taking time to come for your Medicare Wellness Visit. I appreciate your ongoing commitment to your health goals. Please review the following plan we discussed and let me know if I can assist you in the future.   These are the goals we discussed: Goals    . Exercise 150 min/wk Moderate Activity       This is a list of the screening recommended for you and due dates:  Health Maintenance  Topic Date Due  . Flu Shot  12/31/2017  . Tetanus Vaccine  08/11/2023  . DEXA scan (bone density measurement)  Completed  . Pneumonia vaccines  Completed

## 2017-10-23 LAB — FECAL OCCULT BLOOD, IMMUNOCHEMICAL: Fecal Occult Bld: NEGATIVE

## 2017-11-02 ENCOUNTER — Ambulatory Visit: Payer: Medicare Other | Admitting: *Deleted

## 2017-11-09 ENCOUNTER — Other Ambulatory Visit: Payer: Self-pay | Admitting: Family

## 2017-11-09 DIAGNOSIS — M79604 Pain in right leg: Secondary | ICD-10-CM

## 2017-11-09 DIAGNOSIS — M79605 Pain in left leg: Principal | ICD-10-CM

## 2017-12-24 DIAGNOSIS — L821 Other seborrheic keratosis: Secondary | ICD-10-CM | POA: Diagnosis not present

## 2017-12-24 DIAGNOSIS — D225 Melanocytic nevi of trunk: Secondary | ICD-10-CM | POA: Diagnosis not present

## 2017-12-24 DIAGNOSIS — L210 Seborrhea capitis: Secondary | ICD-10-CM | POA: Diagnosis not present

## 2017-12-24 DIAGNOSIS — D1801 Hemangioma of skin and subcutaneous tissue: Secondary | ICD-10-CM | POA: Diagnosis not present

## 2017-12-24 DIAGNOSIS — L303 Infective dermatitis: Secondary | ICD-10-CM | POA: Diagnosis not present

## 2018-01-13 ENCOUNTER — Other Ambulatory Visit: Payer: Self-pay | Admitting: Family

## 2018-01-13 DIAGNOSIS — M79604 Pain in right leg: Secondary | ICD-10-CM

## 2018-01-13 DIAGNOSIS — M79605 Pain in left leg: Principal | ICD-10-CM

## 2018-01-25 ENCOUNTER — Encounter: Payer: Self-pay | Admitting: Family

## 2018-01-25 ENCOUNTER — Ambulatory Visit (INDEPENDENT_AMBULATORY_CARE_PROVIDER_SITE_OTHER): Payer: Medicare Other | Admitting: Family

## 2018-01-25 ENCOUNTER — Ambulatory Visit (INDEPENDENT_AMBULATORY_CARE_PROVIDER_SITE_OTHER): Payer: Medicare Other

## 2018-01-25 VITALS — BP 131/69 | HR 43 | Temp 97.0°F | Ht 61.0 in | Wt 152.0 lb

## 2018-01-25 DIAGNOSIS — I700 Atherosclerosis of aorta: Secondary | ICD-10-CM | POA: Diagnosis not present

## 2018-01-25 DIAGNOSIS — E785 Hyperlipidemia, unspecified: Secondary | ICD-10-CM | POA: Diagnosis not present

## 2018-01-25 DIAGNOSIS — G4700 Insomnia, unspecified: Secondary | ICD-10-CM | POA: Diagnosis not present

## 2018-01-25 DIAGNOSIS — M545 Low back pain, unspecified: Secondary | ICD-10-CM

## 2018-01-25 DIAGNOSIS — M8580 Other specified disorders of bone density and structure, unspecified site: Secondary | ICD-10-CM

## 2018-01-25 DIAGNOSIS — M159 Polyosteoarthritis, unspecified: Secondary | ICD-10-CM

## 2018-01-25 DIAGNOSIS — M1611 Unilateral primary osteoarthritis, right hip: Secondary | ICD-10-CM | POA: Diagnosis not present

## 2018-01-25 DIAGNOSIS — E663 Overweight: Secondary | ICD-10-CM | POA: Diagnosis not present

## 2018-01-25 DIAGNOSIS — E559 Vitamin D deficiency, unspecified: Secondary | ICD-10-CM

## 2018-01-25 DIAGNOSIS — I10 Essential (primary) hypertension: Secondary | ICD-10-CM | POA: Diagnosis not present

## 2018-01-25 DIAGNOSIS — M79604 Pain in right leg: Secondary | ICD-10-CM

## 2018-01-25 DIAGNOSIS — Z8673 Personal history of transient ischemic attack (TIA), and cerebral infarction without residual deficits: Secondary | ICD-10-CM

## 2018-01-25 DIAGNOSIS — F411 Generalized anxiety disorder: Secondary | ICD-10-CM | POA: Diagnosis not present

## 2018-01-25 DIAGNOSIS — M79605 Pain in left leg: Secondary | ICD-10-CM

## 2018-01-25 MED ORDER — BUSPIRONE HCL 5 MG PO TABS: 5.0000 mg | ORAL_TABLET | Freq: Three times a day (TID) | ORAL | 1 refills | Status: DC

## 2018-01-25 MED ORDER — GABAPENTIN 100 MG PO CAPS: ORAL_CAPSULE | ORAL | 2 refills | Status: DC

## 2018-01-25 MED ORDER — LOSARTAN POTASSIUM-HCTZ 100-25 MG PO TABS: 1.0000 | ORAL_TABLET | Freq: Every day | ORAL | 3 refills | Status: DC

## 2018-01-25 MED ORDER — RALOXIFENE HCL 60 MG PO TABS: 60.0000 mg | ORAL_TABLET | Freq: Every day | ORAL | 3 refills | Status: DC

## 2018-01-25 NOTE — Patient Instructions (Signed)

## 2018-01-25 NOTE — Progress Notes (Signed)
Subjective:    Patient ID: Andrea Moreno, female    DOB: 1940-10-26, 77 y.o.   MRN: [id number]   Chief Complaint  Patient presents with  . Medical Management of Chronic Issues    six month recheck   Pt presents to the office today for chronic follow up.  Hypertension  This is a chronic problem. The current episode started more than 1 year ago. The problem has been resolved since onset. The problem is controlled. Associated symptoms include anxiety. Pertinent negatives include no malaise/fatigue, peripheral edema or shortness of breath. Risk factors for coronary artery disease include dyslipidemia and sedentary lifestyle. The current treatment provides moderate improvement. Hypertensive end-organ damage includes CAD/MI. There is no history of CVA.  Hyperlipidemia  This is a chronic problem. The current episode started more than 1 year ago. The problem is controlled. Recent lipid tests were reviewed and are normal. Pertinent negatives include no shortness of breath. Current antihyperlipidemic treatment includes diet change. The current treatment provides moderate improvement of lipids. Risk factors for coronary artery disease include dyslipidemia, hypertension and a sedentary lifestyle.  Anxiety  Presents for follow-up visit. Symptoms include excessive worry, insomnia, irritability, nervous/anxious behavior and restlessness. Patient reports no shortness of breath. Symptoms occur most days. The severity of symptoms is moderate. The quality of sleep is good.    Arthritis  Presents for follow-up visit. She complains of pain and stiffness. The symptoms have been worsening. Affected locations include the left knee, right knee, right MCP, left MCP, left foot and right foot (back). Her pain is at a severity of 5/10.  Insomnia  Primary symptoms: difficulty falling asleep, frequent awakening, no malaise/fatigue.  The current episode started more than one year. The onset quality is gradual. The  problem occurs intermittently. The problem has been waxing and waning since onset.  Back Pain  This is a chronic problem. The current episode started more than 1 year ago. The problem occurs intermittently. The problem has been waxing and waning since onset. The pain is present in the lumbar spine. The quality of the pain is described as aching. The pain is at a severity of 8/10. The pain is moderate. The pain is worse during the day. The symptoms are aggravated by standing.  Osteopenic PT currently taking Evista daily.     Review of Systems  Constitutional: Positive for irritability. Negative for malaise/fatigue.  Respiratory: Negative for shortness of breath.   Musculoskeletal: Positive for arthritis, back pain and stiffness.  Psychiatric/Behavioral: The patient is nervous/anxious and has insomnia.   All other systems reviewed and are negative.      Objective:   Physical Exam  Constitutional: She is oriented to person, place, and time. She appears well-developed and well-nourished. No distress.  HENT:  Head: Normocephalic and atraumatic.  Right Ear: External ear normal.  Left Ear: External ear normal.  Mouth/Throat: Oropharynx is clear and moist.  Eyes: Pupils are equal, round, and reactive to light.  Neck: Normal range of motion. Neck supple. No thyromegaly present.  Cardiovascular: Normal rate, regular rhythm, normal heart sounds and intact distal pulses.  No murmur heard. Pulmonary/Chest: Effort normal and breath sounds normal. No respiratory distress. She has no wheezes.  Abdominal: Soft. Bowel sounds are normal. She exhibits no distension. There is no tenderness.  Musculoskeletal: Normal range of motion. She exhibits no edema or tenderness.  Neurological: She is alert and oriented to person, place, and time. She has normal reflexes. No cranial nerve deficit.  Skin: Skin is  warm and dry.  Psychiatric: She has a normal mood and affect. Her behavior is normal. Judgment and  thought content normal.  Vitals reviewed.   BP 131/69   Pulse (!) 43   Temp (!) 97 F (36.1 C) (Oral)   Ht _0  (1.549 m)   Wt 152 lb (68.9 kg)   BMI 28.72 kg/m       Assessment & Plan:  Andrea Moreno comes in today with chief complaint of Medical Management of Chronic Issues (six month recheck)   Diagnosis and orders addressed:  1. Essential hypertension - CMP14+EGFR - CBC with Differential/Platelet - losartan-hydrochlorothiazide (HYZAAR) 100-25 MG tablet; Take 1 tablet by mouth daily.  Dispense: 90 tablet; Refill: 3  2. Hyperlipidemia, unspecified hyperlipidemia type - CMP14+EGFR - Lipid panel - CBC with Differential/Platelet  3. Insomnia, unspecified type - CMP14+EGFR - CBC with Differential/Platelet  4. GAD (generalized anxiety disorder) Will add buspar today as needed Stress management discussed - CMP14+EGFR - CBC with Differential/Platelet - busPIRone (BUSPAR) 5 MG tablet; Take 1 tablet (5 mg total) by mouth 3 (three) times daily.  Dispense: 90 tablet; Refill: 1  5. Aortic atherosclerosis (HCC) - CMP14+EGFR - CBC with Differential/Platelet  6. History of TIA (transient ischemic attack) - CMP14+EGFR - CBC with Differential/Platelet  7. Osteoarthritis of multiple joints, unspecified osteoarthritis type - CMP14+EGFR - CBC with Differential/Platelet - DG Lumbar Spine 2-3 Views; Future  8. Vitamin D deficiency - CMP14+EGFR - CBC with Differential/Platelet  9. Primary osteoarthritis of right hip - CMP14+EGFR - CBC with Differential/Platelet  10. Overweight (BMI 25.0-29.9) - CMP14+EGFR - CBC with Differential/Platelet  11. Osteopenia, unspecified location - CMP14+EGFR - CBC with Differential/Platelet - raloxifene (EVISTA) 60 MG tablet; Take 1 tablet (60 mg total) by mouth daily.  Dispense: 90 tablet; Refill: 3  12. Acute bilateral low back pain without sciatica - CMP14+EGFR - CBC with Differential/Platelet - DG Lumbar Spine 2-3 Views;  Future  13. Pain in both lower extremities - gabapentin (NEURONTIN) 100 MG capsule; TAKE UP TO 4-6 CAPSULES AT BEDTIME  Dispense: 150 capsule; Refill: 2   Labs pending Health Maintenance reviewed Diet and exercise encouraged  Follow up plan: 6 months    Evelina Dun, FNP

## 2018-01-26 ENCOUNTER — Telehealth: Payer: Self-pay | Admitting: Family

## 2018-01-26 ENCOUNTER — Other Ambulatory Visit: Payer: Self-pay | Admitting: Family

## 2018-01-26 LAB — CBC WITH DIFFERENTIAL/PLATELET
BASOS ABS: 0 10*3/uL (ref 0.0–0.2)
Basos: 1 %
EOS (ABSOLUTE): 0.1 10*3/uL (ref 0.0–0.4)
Eos: 3 %
HEMOGLOBIN: 13.4 g/dL (ref 11.1–15.9)
Hematocrit: 39.5 % (ref 34.0–46.6)
IMMATURE GRANS (ABS): 0 10*3/uL (ref 0.0–0.1)
IMMATURE GRANULOCYTES: 0 %
LYMPHS: 36 %
Lymphocytes Absolute: 1.3 10*3/uL (ref 0.7–3.1)
MCH: 30.9 pg (ref 26.6–33.0)
MCHC: 33.9 g/dL (ref 31.5–35.7)
MCV: 91 fL (ref 79–97)
MONOCYTES: 8 %
Monocytes Absolute: 0.3 10*3/uL (ref 0.1–0.9)
NEUTROS PCT: 52 %
Neutrophils Absolute: 1.8 10*3/uL (ref 1.4–7.0)
PLATELETS: 210 10*3/uL (ref 150–450)
RBC: 4.34 x10E6/uL (ref 3.77–5.28)
RDW: 13.9 % (ref 12.3–15.4)
WBC: 3.6 10*3/uL (ref 3.4–10.8)

## 2018-01-26 LAB — CMP14+EGFR
A/G RATIO: 2 (ref 1.2–2.2)
ALK PHOS: 39 IU/L (ref 39–117)
ALT: 12 IU/L (ref 0–32)
AST: 17 IU/L (ref 0–40)
Albumin: 4.6 g/dL (ref 3.5–4.8)
BILIRUBIN TOTAL: 0.5 mg/dL (ref 0.0–1.2)
BUN/Creatinine Ratio: 17 (ref 12–28)
BUN: 15 mg/dL (ref 8–27)
CO2: 23 mmol/L (ref 20–29)
Calcium: 9.2 mg/dL (ref 8.7–10.3)
Chloride: 103 mmol/L (ref 96–106)
Creatinine, Ser: 0.9 mg/dL (ref 0.57–1.00)
GFR calc Af Amer: 71 mL/min/{1.73_m2} (ref >59)
GFR calc non Af Amer: 62 mL/min/{1.73_m2} (ref >59)
GLOBULIN, TOTAL: 2.3 g/dL (ref 1.5–4.5)
Glucose: 101 mg/dL — ABNORMAL HIGH (ref 65–99)
POTASSIUM: 3.3 mmol/L — AB (ref 3.5–5.2)
SODIUM: 141 mmol/L (ref 134–144)
Total Protein: 6.9 g/dL (ref 6.0–8.5)

## 2018-01-26 LAB — LIPID PANEL
CHOLESTEROL TOTAL: 188 mg/dL (ref 100–199)
Chol/HDL Ratio: 3.1 ratio (ref 0.0–4.4)
HDL: 60 mg/dL (ref >39)
LDL Calculated: 104 mg/dL — ABNORMAL HIGH (ref 0–99)
TRIGLYCERIDES: 120 mg/dL (ref 0–149)
VLDL Cholesterol Cal: 24 mg/dL (ref 5–40)

## 2018-01-26 MED ORDER — POTASSIUM CHLORIDE ER 10 MEQ PO TBCR: 10.0000 meq | EXTENDED_RELEASE_TABLET | Freq: Every day | ORAL | 1 refills | Status: DC

## 2018-01-26 NOTE — Telephone Encounter (Signed)
Patient notified of lab results

## 2018-03-30 DIAGNOSIS — Z23 Encounter for immunization: Secondary | ICD-10-CM | POA: Diagnosis not present

## 2018-05-08 DIAGNOSIS — J069 Acute upper respiratory infection, unspecified: Secondary | ICD-10-CM | POA: Diagnosis not present

## 2018-05-29 ENCOUNTER — Other Ambulatory Visit: Payer: Self-pay | Admitting: Family

## 2018-05-29 DIAGNOSIS — M79605 Pain in left leg: Principal | ICD-10-CM

## 2018-05-29 DIAGNOSIS — M79604 Pain in right leg: Secondary | ICD-10-CM

## 2018-07-01 ENCOUNTER — Other Ambulatory Visit: Payer: Self-pay | Admitting: Family

## 2018-07-01 DIAGNOSIS — M79604 Pain in right leg: Secondary | ICD-10-CM

## 2018-07-01 DIAGNOSIS — M79605 Pain in left leg: Principal | ICD-10-CM

## 2018-07-02 NOTE — Telephone Encounter (Signed)
Last seen 8/26 last filled 12/30

## 2018-07-08 ENCOUNTER — Other Ambulatory Visit: Payer: Self-pay | Admitting: Family

## 2018-07-08 DIAGNOSIS — F411 Generalized anxiety disorder: Secondary | ICD-10-CM

## 2018-07-10 ENCOUNTER — Other Ambulatory Visit: Payer: Self-pay | Admitting: Family

## 2018-07-29 ENCOUNTER — Ambulatory Visit (INDEPENDENT_AMBULATORY_CARE_PROVIDER_SITE_OTHER): Payer: Medicare Other | Admitting: Family

## 2018-07-29 ENCOUNTER — Encounter: Payer: Self-pay | Admitting: Family

## 2018-07-29 VITALS — BP 129/74 | HR 49 | Temp 97.1°F | Ht 61.0 in | Wt 151.2 lb

## 2018-07-29 DIAGNOSIS — M79604 Pain in right leg: Secondary | ICD-10-CM | POA: Diagnosis not present

## 2018-07-29 DIAGNOSIS — F411 Generalized anxiety disorder: Secondary | ICD-10-CM | POA: Diagnosis not present

## 2018-07-29 DIAGNOSIS — M159 Polyosteoarthritis, unspecified: Secondary | ICD-10-CM | POA: Diagnosis not present

## 2018-07-29 DIAGNOSIS — I700 Atherosclerosis of aorta: Secondary | ICD-10-CM

## 2018-07-29 DIAGNOSIS — M1611 Unilateral primary osteoarthritis, right hip: Secondary | ICD-10-CM | POA: Diagnosis not present

## 2018-07-29 DIAGNOSIS — M79605 Pain in left leg: Secondary | ICD-10-CM

## 2018-07-29 DIAGNOSIS — I10 Essential (primary) hypertension: Secondary | ICD-10-CM

## 2018-07-29 MED ORDER — LOSARTAN POTASSIUM 100 MG PO TABS: 100.0000 mg | ORAL_TABLET | Freq: Every day | ORAL | 3 refills | Status: DC

## 2018-07-29 NOTE — Patient Instructions (Signed)

## 2018-07-29 NOTE — Progress Notes (Signed)
Subjective:    Patient ID: Andrea Moreno, female    DOB: Sep 23, 1940, 78 y.o.   MRN: [id number]  Chief Complaint  Patient presents with  . Medical Management of Chronic Issues    six month recheck   PT presents to the office today for chronic follow up. Pt is followed by Ortho as needed for injections. Has  Hx of right hip replacement in 2017.  Hypertension  This is a chronic problem. The current episode started more than 1 year ago. The problem has been resolved since onset. The problem is controlled. Associated symptoms include anxiety and malaise/fatigue. Pertinent negatives include no peripheral edema or shortness of breath. Risk factors for coronary artery disease include dyslipidemia and obesity. The current treatment provides moderate improvement. There is no history of CAD/MI or heart failure.  Hyperlipidemia  This is a chronic problem. The current episode started more than 1 year ago. The problem is uncontrolled. Recent lipid tests were reviewed and are high. Pertinent negatives include no shortness of breath. Current antihyperlipidemic treatment includes diet change. The current treatment provides mild improvement of lipids.  Arthritis  Presents for follow-up visit. She complains of pain and stiffness. The symptoms have been stable. Affected locations include the left MCP, left hip, right hip and right MCP. Her pain is at a severity of 4/10.  Insomnia  Primary symptoms: difficulty falling asleep, frequent awakening, malaise/fatigue.  The current episode started more than one year. The onset quality is gradual. The problem occurs intermittently. The problem has been waxing and waning since onset.  Anxiety  Presents for follow-up visit. Symptoms include excessive worry, insomnia and nervous/anxious behavior. Patient reports no depressed mood or shortness of breath. Symptoms occur occasionally. The severity of symptoms is mild.    Leg Pain States she has bilateral aching leg pain  of 8 out 10 every night. States the gabapentin 400 mg helps slightly.     Review of Systems  Constitutional: Positive for malaise/fatigue.  Respiratory: Negative for shortness of breath.   Musculoskeletal: Positive for arthritis and stiffness.  Psychiatric/Behavioral: The patient is nervous/anxious and has insomnia.   All other systems reviewed and are negative.      Objective:   Physical Exam Vitals signs reviewed.  Constitutional:      General: She is not in acute distress.    Appearance: She is well-developed.  HENT:     Head: Normocephalic and atraumatic.     Right Ear: Tympanic membrane normal.     Left Ear: Tympanic membrane normal.  Eyes:     Pupils: Pupils are equal, round, and reactive to light.  Neck:     Musculoskeletal: Normal range of motion and neck supple.     Thyroid: No thyromegaly.  Cardiovascular:     Rate and Rhythm: Normal rate and regular rhythm.     Heart sounds: Normal heart sounds. No murmur.  Pulmonary:     Effort: Pulmonary effort is normal. No respiratory distress.     Breath sounds: Normal breath sounds. No wheezing.  Abdominal:     General: Bowel sounds are normal. There is no distension.     Palpations: Abdomen is soft.     Tenderness: There is no abdominal tenderness.  Musculoskeletal: Normal range of motion.        General: No tenderness.  Skin:    General: Skin is warm and dry.  Neurological:     Mental Status: She is alert and oriented to person, place, and time.  Cranial Nerves: No cranial nerve deficit.     Deep Tendon Reflexes: Reflexes are normal and symmetric.  Psychiatric:        Behavior: Behavior normal.        Thought Content: Thought content normal.        Judgment: Judgment normal.       BP 129/74   Pulse (!) 49   Temp (!) 97.1 F (36.2 C) (Oral)   Ht '5\' 1"'  (1.549 m)   Wt 151 lb 3.2 oz (68.6 kg)   BMI 28.57 kg/m      Assessment & Plan:  Andrea Moreno comes in today with chief complaint of Medical  Management of Chronic Issues (six month recheck)   Diagnosis and orders addressed:  1. Essential hypertension We will stop HCTZ 25 mg today, continue Losartan 100 mg -Dash diet information given -Exercise encouraged - CBC with Differential/Platelet - CMP14+EGFR - losartan (COZAAR) 100 MG tablet; Take 1 tablet (100 mg total) by mouth daily.  Dispense: 90 tablet; Refill: 3  2. Osteoarthritis of multiple joints, unspecified osteoarthritis type - CBC with Differential/Platelet - CMP14+EGFR  3. Primary osteoarthritis of right hip - CBC with Differential/Platelet - CMP14+EGFR  4. GAD (generalized anxiety disorder) - CBC with Differential/Platelet - CMP14+EGFR  5. Aortic atherosclerosis (HCC) - CBC with Differential/Platelet - CMP14+EGFR  6. Pain in both lower extremities Will stop HCTZ to see if this pain is related to leg cramps?  Continue gabapentin  - CBC with Differential/Platelet - CMP14+EGFR   Labs pending Health Maintenance reviewed Diet and exercise encouraged  Follow up plan: 2 weeks to recheck HTN after stopping HCTZ 25 mg    Evelina Dun, FNP

## 2018-07-30 LAB — CBC WITH DIFFERENTIAL/PLATELET
Basophils Absolute: 0.1 10*3/uL (ref 0.0–0.2)
Basos: 1 %
EOS (ABSOLUTE): 0.1 10*3/uL (ref 0.0–0.4)
Eos: 4 %
Hematocrit: 37 % (ref 34.0–46.6)
Hemoglobin: 12.7 g/dL (ref 11.1–15.9)
IMMATURE GRANULOCYTES: 0 %
Immature Grans (Abs): 0 10*3/uL (ref 0.0–0.1)
Lymphocytes Absolute: 1.2 10*3/uL (ref 0.7–3.1)
Lymphs: 32 %
MCH: 30.8 pg (ref 26.6–33.0)
MCHC: 34.3 g/dL (ref 31.5–35.7)
MCV: 90 fL (ref 79–97)
Monocytes Absolute: 0.4 10*3/uL (ref 0.1–0.9)
Monocytes: 10 %
NEUTROS PCT: 53 %
Neutrophils Absolute: 1.9 10*3/uL (ref 1.4–7.0)
Platelets: 209 10*3/uL (ref 150–450)
RBC: 4.12 x10E6/uL (ref 3.77–5.28)
RDW: 11.9 % (ref 11.7–15.4)
WBC: 3.6 10*3/uL (ref 3.4–10.8)

## 2018-07-30 LAB — CMP14+EGFR
ALT: 14 IU/L (ref 0–32)
AST: 16 IU/L (ref 0–40)
Albumin/Globulin Ratio: 2 (ref 1.2–2.2)
Albumin: 4.4 g/dL (ref 3.7–4.7)
Alkaline Phosphatase: 35 IU/L — ABNORMAL LOW (ref 39–117)
BUN/Creatinine Ratio: 21 (ref 12–28)
BUN: 18 mg/dL (ref 8–27)
Bilirubin Total: 0.6 mg/dL (ref 0.0–1.2)
CO2: 22 mmol/L (ref 20–29)
Calcium: 10.1 mg/dL (ref 8.7–10.3)
Chloride: 106 mmol/L (ref 96–106)
Creatinine, Ser: 0.86 mg/dL (ref 0.57–1.00)
GFR calc Af Amer: 75 mL/min/{1.73_m2} (ref >59)
GFR calc non Af Amer: 65 mL/min/{1.73_m2} (ref >59)
GLUCOSE: 93 mg/dL (ref 65–99)
Globulin, Total: 2.2 g/dL (ref 1.5–4.5)
Potassium: 4.1 mmol/L (ref 3.5–5.2)
Sodium: 144 mmol/L (ref 134–144)
Total Protein: 6.6 g/dL (ref 6.0–8.5)

## 2018-08-09 ENCOUNTER — Other Ambulatory Visit: Payer: Self-pay | Admitting: Sports Medicine

## 2018-08-09 DIAGNOSIS — M545 Low back pain, unspecified: Secondary | ICD-10-CM

## 2018-08-09 DIAGNOSIS — M47816 Spondylosis without myelopathy or radiculopathy, lumbar region: Secondary | ICD-10-CM

## 2018-08-09 DIAGNOSIS — M5136 Other intervertebral disc degeneration, lumbar region: Secondary | ICD-10-CM | POA: Diagnosis not present

## 2018-08-09 DIAGNOSIS — M1812 Unilateral primary osteoarthritis of first carpometacarpal joint, left hand: Secondary | ICD-10-CM | POA: Diagnosis not present

## 2018-08-12 ENCOUNTER — Ambulatory Visit (INDEPENDENT_AMBULATORY_CARE_PROVIDER_SITE_OTHER): Payer: Medicare Other | Admitting: Family

## 2018-08-12 ENCOUNTER — Encounter: Payer: Self-pay | Admitting: Family

## 2018-08-12 ENCOUNTER — Other Ambulatory Visit: Payer: Self-pay

## 2018-08-12 VITALS — BP 128/51 | HR 50 | Temp 97.2°F | Ht 61.0 in | Wt 152.4 lb

## 2018-08-12 DIAGNOSIS — R252 Cramp and spasm: Secondary | ICD-10-CM

## 2018-08-12 DIAGNOSIS — R350 Frequency of micturition: Secondary | ICD-10-CM

## 2018-08-12 DIAGNOSIS — M545 Low back pain: Secondary | ICD-10-CM

## 2018-08-12 DIAGNOSIS — M549 Dorsalgia, unspecified: Secondary | ICD-10-CM

## 2018-08-12 DIAGNOSIS — G8929 Other chronic pain: Secondary | ICD-10-CM | POA: Insufficient documentation

## 2018-08-12 DIAGNOSIS — I10 Essential (primary) hypertension: Secondary | ICD-10-CM | POA: Diagnosis not present

## 2018-08-12 LAB — URINALYSIS, COMPLETE
Bilirubin, UA: NEGATIVE
Glucose, UA: NEGATIVE
Ketones, UA: NEGATIVE
Leukocytes, UA: NEGATIVE
Nitrite, UA: NEGATIVE
PROTEIN UA: NEGATIVE
RBC, UA: NEGATIVE
Specific Gravity, UA: 1.02 (ref 1.005–1.030)
Urobilinogen, Ur: 0.2 mg/dL (ref 0.2–1.0)
pH, UA: 8.5 — ABNORMAL HIGH (ref 5.0–7.5)

## 2018-08-12 LAB — MICROSCOPIC EXAMINATION
Bacteria, UA: NONE SEEN
Epithelial Cells (non renal): NONE SEEN /hpf (ref 0–10)
RBC, UA: NONE SEEN /hpf (ref 0–2)
Renal Epithel, UA: NONE SEEN /hpf

## 2018-08-12 MED ORDER — BUSPIRONE HCL 5 MG PO TABS: ORAL_TABLET | ORAL | 2 refills | Status: DC

## 2018-08-12 MED ORDER — RALOXIFENE HCL 60 MG PO TABS: 60.0000 mg | ORAL_TABLET | Freq: Every day | ORAL | 3 refills | Status: DC

## 2018-08-12 MED ORDER — GABAPENTIN 100 MG PO CAPS: ORAL_CAPSULE | ORAL | 2 refills | Status: DC

## 2018-08-12 MED ORDER — LOSARTAN POTASSIUM 100 MG PO TABS: 100.0000 mg | ORAL_TABLET | Freq: Every day | ORAL | 3 refills | Status: DC

## 2018-08-12 NOTE — Progress Notes (Signed)
Subjective:    Patient ID: Andrea Moreno, female    DOB: 03-08-1941, 78 y.o.   MRN: [id number]  Chief Complaint  Patient presents with  . Hypertension   Pt presents to the office today to recheck BP after stopping HCTZ for leg cramps. She states her legs cramps are improving. She reports she has noticed urinary frequency over the last few weeks, but denies any dysuria.   Pt states she is having a MRI scheduled on April 3 and plans to restart back injections.  Hypertension  This is a chronic problem. The current episode started more than 1 year ago. The problem has been resolved since onset. The problem is controlled. Associated symptoms include malaise/fatigue. Pertinent negatives include no peripheral edema or shortness of breath. The current treatment provides moderate improvement.  Urinary Frequency   This is a new problem. The current episode started more than 1 year ago. The problem occurs every urination. The problem has been waxing and waning. The pain is at a severity of 0/10. The patient is experiencing no pain. Associated symptoms include frequency and urgency. Pertinent negatives include no hematuria, hesitancy or vomiting. She has tried increased fluids for the symptoms. The treatment provided mild relief.  Back Pain  This is a chronic problem. The current episode started more than 1 year ago. The problem occurs intermittently. The problem has been waxing and waning since onset. The pain is present in the lumbar spine. The pain is at a severity of 8/10. The pain is moderate. Associated symptoms include leg pain. Pertinent negatives include no bladder incontinence or bowel incontinence. She has tried analgesics for the symptoms. The treatment provided mild relief.      Review of Systems  Constitutional: Positive for malaise/fatigue.  Respiratory: Negative for shortness of breath.   Gastrointestinal: Negative for bowel incontinence and vomiting.  Genitourinary: Positive for  frequency and urgency. Negative for bladder incontinence, hematuria and hesitancy.  Musculoskeletal: Positive for back pain.  All other systems reviewed and are negative.      Objective:   Physical Exam Vitals signs reviewed.  Constitutional:      General: She is not in acute distress.    Appearance: She is well-developed.  HENT:     Head: Normocephalic.  Eyes:     Pupils: Pupils are equal, round, and reactive to light.  Neck:     Musculoskeletal: Normal range of motion and neck supple.     Thyroid: No thyromegaly.  Cardiovascular:     Rate and Rhythm: Normal rate and regular rhythm.     Heart sounds: Normal heart sounds. No murmur.  Pulmonary:     Effort: Pulmonary effort is normal. No respiratory distress.     Breath sounds: Normal breath sounds. No wheezing.  Abdominal:     General: Bowel sounds are normal. There is no distension.     Palpations: Abdomen is soft.     Tenderness: There is no abdominal tenderness.  Musculoskeletal:        General: No tenderness.     Comments: Pain in lower back pain with flexion and extension  Skin:    General: Skin is warm and dry.  Neurological:     Mental Status: She is alert and oriented to person, place, and time.     Cranial Nerves: No cranial nerve deficit.     Deep Tendon Reflexes: Reflexes are normal and symmetric.  Psychiatric:        Behavior: Behavior normal.  Thought Content: Thought content normal.        Judgment: Judgment normal.       BP (!) 128/51   Pulse (!) 50   Temp (!) 97.2 F (36.2 C) (Oral)   Ht '5\' 1"'  (1.549 m)   Wt 152 lb 6.4 oz (69.1 kg)   BMI 28.80 kg/m      Assessment & Plan:  Andrea Moreno comes in today with chief complaint of Hypertension   Diagnosis and orders addressed:  1. Essential hypertension Continue losartan - BMP8+EGFR - busPIRone (BUSPAR) 5 MG tablet; TAKE  (1)  TABLET  THREE TIMES DAILY.  Dispense: 90 tablet; Refill: 2 - losartan (COZAAR) 100 MG tablet; Take 1  tablet (100 mg total) by mouth daily.  Dispense: 90 tablet; Refill: 3  2. Urinary frequency - BMP8+EGFR - Urinalysis, Complete  3. Leg cramps Improving since stopping HCTZ  4. Chronic bilateral low back pain, unspecified whether sciatica present Keep ortho appts     Follow up plan: 4 months    Andrea Dun, FNP

## 2018-08-13 LAB — BMP8+EGFR
BUN/Creatinine Ratio: 21 (ref 12–28)
BUN: 18 mg/dL (ref 8–27)
CO2: 23 mmol/L (ref 20–29)
Calcium: 9.4 mg/dL (ref 8.7–10.3)
Chloride: 107 mmol/L — ABNORMAL HIGH (ref 96–106)
Creatinine, Ser: 0.87 mg/dL (ref 0.57–1.00)
GFR calc Af Amer: 74 mL/min/{1.73_m2} (ref >59)
GFR calc non Af Amer: 64 mL/min/{1.73_m2} (ref >59)
Glucose: 84 mg/dL (ref 65–99)
Potassium: 4 mmol/L (ref 3.5–5.2)
SODIUM: 144 mmol/L (ref 134–144)

## 2018-09-03 ENCOUNTER — Other Ambulatory Visit: Payer: Self-pay

## 2018-09-03 ENCOUNTER — Ambulatory Visit (HOSPITAL_COMMUNITY)
Admission: RE | Admit: 2018-09-03 | Discharge: 2018-09-03 | Disposition: A | Discharge status: Home / Self Care | Payer: Medicare Other | Source: Ambulatory Visit | Attending: Sports Medicine | Admitting: Sports Medicine

## 2018-09-03 DIAGNOSIS — M545 Low back pain, unspecified: Secondary | ICD-10-CM

## 2018-09-03 DIAGNOSIS — M47816 Spondylosis without myelopathy or radiculopathy, lumbar region: Secondary | ICD-10-CM | POA: Diagnosis not present

## 2018-09-03 DIAGNOSIS — M5126 Other intervertebral disc displacement, lumbar region: Secondary | ICD-10-CM | POA: Diagnosis not present

## 2018-09-27 DIAGNOSIS — M7062 Trochanteric bursitis, left hip: Secondary | ICD-10-CM | POA: Diagnosis not present

## 2018-09-27 DIAGNOSIS — M5136 Other intervertebral disc degeneration, lumbar region: Secondary | ICD-10-CM | POA: Diagnosis not present

## 2018-09-27 DIAGNOSIS — M47816 Spondylosis without myelopathy or radiculopathy, lumbar region: Secondary | ICD-10-CM | POA: Diagnosis not present

## 2018-10-04 DIAGNOSIS — M47816 Spondylosis without myelopathy or radiculopathy, lumbar region: Secondary | ICD-10-CM | POA: Diagnosis not present

## 2018-10-04 DIAGNOSIS — M5136 Other intervertebral disc degeneration, lumbar region: Secondary | ICD-10-CM | POA: Diagnosis not present

## 2018-10-04 DIAGNOSIS — M545 Low back pain: Secondary | ICD-10-CM | POA: Diagnosis not present

## 2018-10-11 DIAGNOSIS — M25551 Pain in right hip: Secondary | ICD-10-CM | POA: Diagnosis not present

## 2018-10-11 DIAGNOSIS — M7061 Trochanteric bursitis, right hip: Secondary | ICD-10-CM | POA: Diagnosis not present

## 2018-11-02 ENCOUNTER — Ambulatory Visit (INDEPENDENT_AMBULATORY_CARE_PROVIDER_SITE_OTHER): Payer: Medicare Other | Admitting: Family

## 2018-11-02 ENCOUNTER — Encounter: Payer: Self-pay | Admitting: Family

## 2018-11-02 ENCOUNTER — Other Ambulatory Visit: Payer: Self-pay

## 2018-11-02 DIAGNOSIS — B029 Zoster without complications: Secondary | ICD-10-CM | POA: Diagnosis not present

## 2018-11-02 MED ORDER — VALACYCLOVIR HCL 1 G PO TABS: 1000.0000 mg | ORAL_TABLET | Freq: Three times a day (TID) | ORAL | 0 refills | Status: DC

## 2018-11-02 NOTE — Progress Notes (Signed)
   Virtual Visit via telephone Note  I connected with Andrea Moreno on 11/02/18 at 8:55 AM by telephone and verified that I am speaking with the correct person using two identifiers. Andrea Moreno is currently located at home and no one is currently with her during visit. The provider, Evelina Dun, FNP is located in their office at time of visit.  I discussed the limitations, risks, security and privacy concerns of performing an evaluation and management service by telephone and the availability of in person appointments. I also discussed with the patient that there may be a patient responsible charge related to this service. The patient expressed understanding and agreed to proceed.   History and Present Illness:  Rash  This is a new problem. The current episode started in the past 7 days. The problem is unchanged. The affected locations include the chest (left breast and left rib). The rash is characterized by redness and pain (pain of 1 out 10). She was exposed to nothing. Pertinent negatives include no congestion, cough, diarrhea, fever, shortness of breath or sore throat. Past treatments include anti-itch cream and topical steroids. The treatment provided mild relief.      Review of Systems  Constitutional: Negative for fever.  HENT: Negative for congestion and sore throat.   Respiratory: Negative for cough and shortness of breath.   Gastrointestinal: Negative for diarrhea.  Skin: Positive for rash.  All other systems reviewed and are negative.    Observations/Objective: No SOB or distress   Assessment and Plan: 1. Herpes zoster without complication Do not scratch Cover area Keep clean and dry Start Valtrex today, call if rash worsens or does not improve. PT declines any pain medication today, but will call if pain worsens.  - valACYclovir (VALTREX) 1000 MG tablet; Take 1 tablet (1,000 mg total) by mouth 3 (three) times daily.  Dispense: 21 tablet; Refill: 0      I discussed the assessment and treatment plan with the patient. The patient was provided an opportunity to ask questions and all were answered. The patient agreed with the plan and demonstrated an understanding of the instructions.   The patient was advised to call back or seek an in-person evaluation if the symptoms worsen or if the condition fails to improve as anticipated.  The above assessment and management plan was discussed with the patient. The patient verbalized understanding of and has agreed to the management plan. Patient is aware to call the clinic if symptoms persist or worsen. Patient is aware when to return to the clinic for a follow-up visit. Patient educated on when it is appropriate to go to the emergency department.   Time call ended:  9:18 AM  I provided 23 minutes of non-face-to-face time during this encounter.    Evelina Dun, FNP

## 2018-11-02 NOTE — Patient Instructions (Signed)
Shingles    Shingles is an infection. It gives you a painful skin rash and blisters that have fluid in them. Shingles is caused by the same germ (virus) that causes chickenpox.  Shingles only happens in people who:   Have had chickenpox.   Have been given a shot of medicine (vaccine) to protect against chickenpox. Shingles is rare in this group.  The first symptoms of shingles may be itching, tingling, or pain in an area on your skin. A rash will show on your skin a few days or weeks later. The rash is likely to be on one side of your body. The rash usually has a shape like a belt or a band. Over time, the rash turns into fluid-filled blisters. The blisters will break open, change into scabs, and dry up. Medicines may:   Help with pain and itching.   Help you get better sooner.   Help to prevent long-term problems.  Follow these instructions at home:  Medicines   Take over-the-counter and prescription medicines only as told by your doctor.   Put on an anti-itch cream or numbing cream where you have a rash, blisters, or scabs. Do this as told by your doctor.  Helping with itching and discomfort     Put cold, wet cloths (cold compresses) on the area of the rash or blisters as told by your doctor.   Cool baths can help you feel better. Try adding baking soda or dry oatmeal to the water to lessen itching. Do not bathe in hot water.  Blister and rash care   Keep your rash covered with a loose bandage (dressing).   Wear loose clothing that does not rub on your rash.   Keep your rash and blisters clean. To do this, wash the area with mild soap and cool water as told by your doctor.   Check your rash every day for signs of infection. Check for:  ? More redness, swelling, or pain.  ? Fluid or blood.  ? Warmth.  ? Pus or a bad smell.   Do not scratch your rash. Do not pick at your blisters. To help you to not scratch:  ? Keep your fingernails clean and cut short.  ? Wear gloves or mittens when you sleep, if  scratching is a problem.  General instructions   Rest as told by your doctor.   Keep all follow-up visits as told by your doctor. This is important.   Wash your hands often with soap and water. If soap and water are not available, use hand sanitizer. Doing this lowers your chance of getting a skin infection caused by germs (bacteria).   Your infection can cause chickenpox in people who have never had chickenpox or never got a shot of chickenpox vaccine. If you have blisters that did not change into scabs yet, try not to touch other people or be around other people, especially:  ? Babies.  ? Pregnant women.  ? Children who have areas of red, itchy, or rough skin (eczema).  ? Very old people who have transplants.  ? People who have a long-term (chronic) sickness, like cancer or AIDS.  Contact a doctor if:   Your pain does not get better with medicine.   Your pain does not get better after the rash heals.   You have any signs of infection in the rash area. These signs include:  ? More redness, swelling, or pain around the rash.  ? Fluid or blood coming from   the rash.  ? The rash area feeling warm to the touch.  ? Pus or a bad smell coming from the rash.  Get help right away if:   The rash is on your face or nose.   You have pain in your face or pain by your eye.   You lose feeling on one side of your face.   You have trouble seeing.   You have ear pain, or you have ringing in your ear.   You have a loss of taste.   Your condition gets worse.  Summary   Shingles gives you a painful skin rash and blisters that have fluid in them.   Shingles is an infection. It is caused by the same germ (virus) that causes chickenpox.   Keep your rash covered with a loose bandage (dressing). Wear loose clothing that does not rub on your rash.   If you have blisters that did not change into scabs yet, try not to touch other people or be around people.  This information is not intended to replace advice given to you by  your health care provider. Make sure you discuss any questions you have with your health care provider.  Document Released: 11/05/2007 Document Revised: 01/21/2017 Document Reviewed: 01/21/2017  Elsevier Interactive Patient Education  2019 [employer]

## 2018-11-05 ENCOUNTER — Telehealth: Payer: Self-pay | Admitting: Family

## 2018-11-05 NOTE — Telephone Encounter (Signed)
Pt notified to finish [place] understanding

## 2018-11-22 ENCOUNTER — Other Ambulatory Visit: Payer: Self-pay | Admitting: Family

## 2018-12-16 IMAGING — DX DG LUMBAR SPINE 2-3V
2 series · 2 of 2 positions shown · non-contrast
Comparison: None.

CLINICAL DATA: Low back pain

EXAM:
LUMBAR SPINE - 2 VIEW

[l-spine ap]
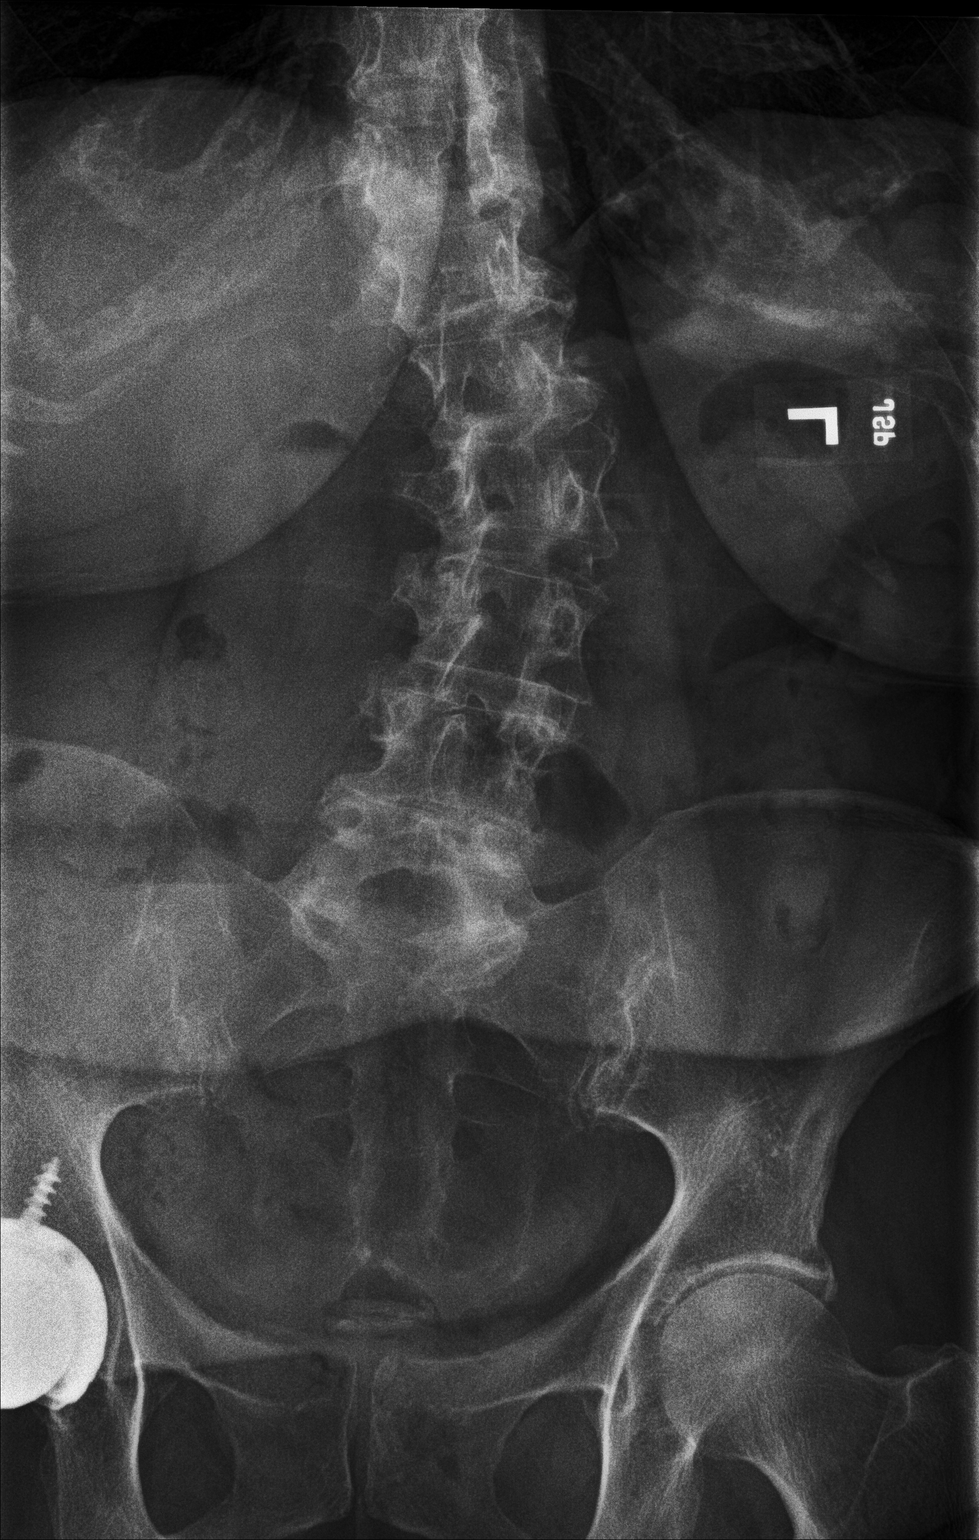

[l-spine lat]
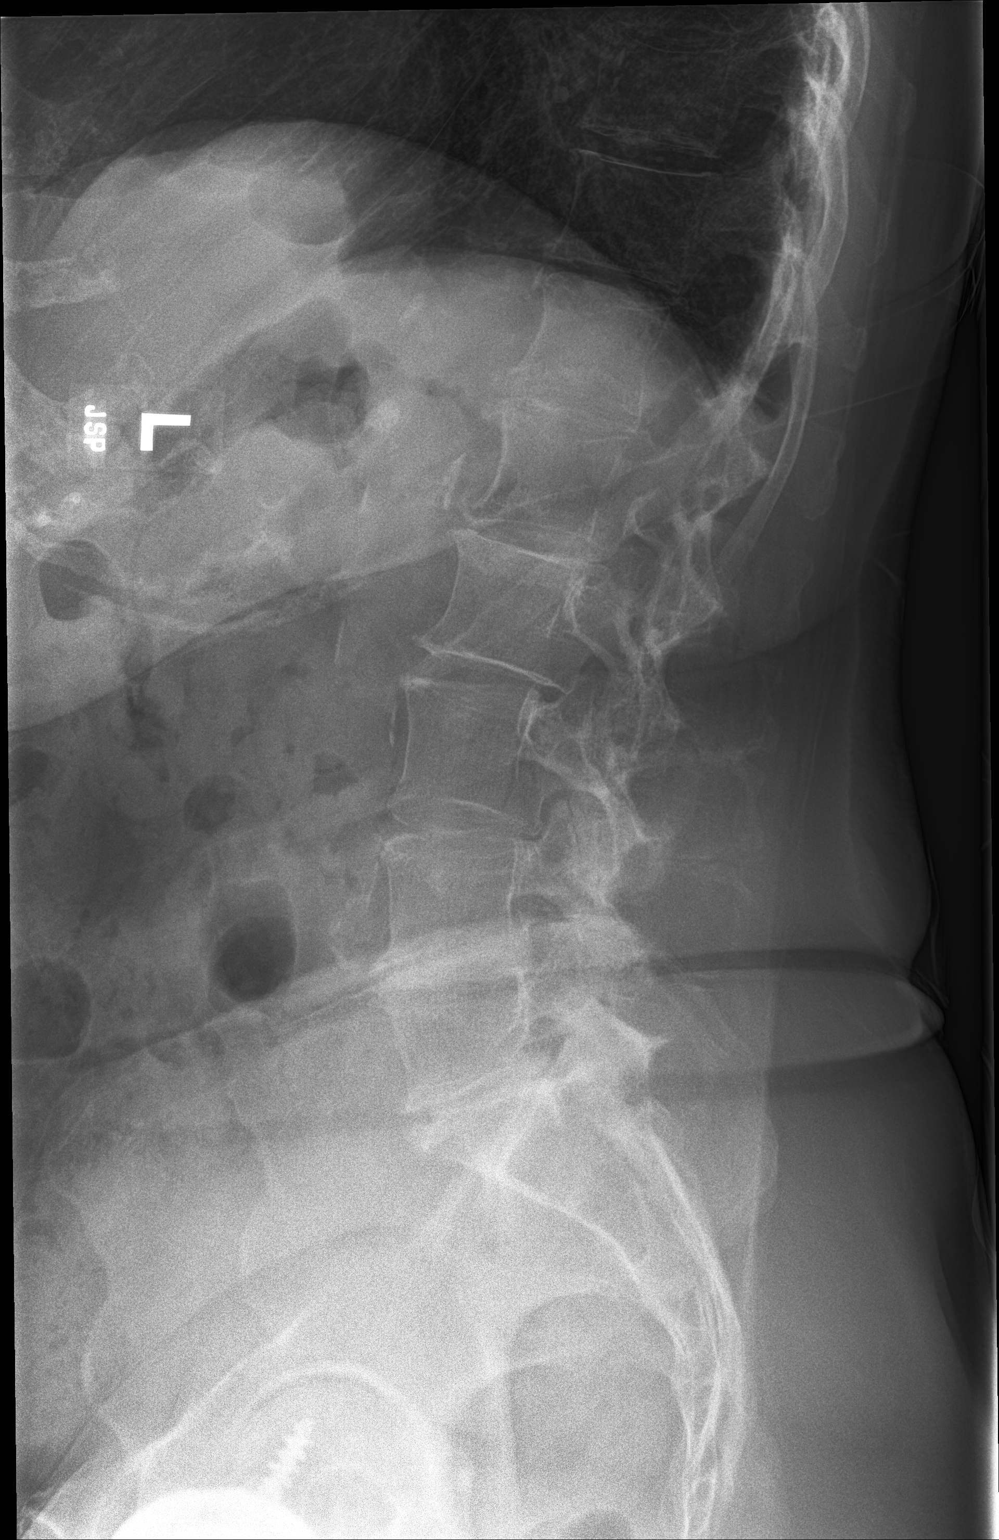

[2 of 2 positions shown; findings below may reference images not displayed]

FINDINGS: Five lumbar type vertebral bodies are well visualized. Scoliosis is
noted at the thoracolumbar junction with concavity to the right.
Mild osteophytic changes are seen. Disc space narrowing at L4-5 and
L5-S1 is noted. Aortic calcifications are noted as well as a right
hip replacement
IMPRESSION: Degenerative changes as described.

## 2018-12-22 ENCOUNTER — Other Ambulatory Visit: Payer: Self-pay | Admitting: Family

## 2019-01-20 ENCOUNTER — Other Ambulatory Visit: Payer: Self-pay | Admitting: Family

## 2019-02-17 ENCOUNTER — Other Ambulatory Visit: Payer: Self-pay | Admitting: Family

## 2019-02-21 ENCOUNTER — Other Ambulatory Visit: Payer: Self-pay

## 2019-02-21 ENCOUNTER — Encounter: Payer: Self-pay | Admitting: Nurse Practitioner

## 2019-02-21 ENCOUNTER — Ambulatory Visit (INDEPENDENT_AMBULATORY_CARE_PROVIDER_SITE_OTHER): Payer: Medicare Other | Admitting: Nurse Practitioner

## 2019-02-21 DIAGNOSIS — J0100 Acute maxillary sinusitis, unspecified: Secondary | ICD-10-CM | POA: Diagnosis not present

## 2019-02-21 MED ORDER — AMOXICILLIN-POT CLAVULANATE 875-125 MG PO TABS: 1.0000 | ORAL_TABLET | Freq: Two times a day (BID) | ORAL | 0 refills | Status: DC

## 2019-02-21 NOTE — Progress Notes (Signed)
Virtual Visit via telephone Note Due to COVID-19 pandemic this visit was conducted virtually. This visit type was conducted due to national recommendations for restrictions regarding the COVID-19 Pandemic (e.g. social distancing, sheltering in place) in an effort to limit this patient's exposure and mitigate transmission in our community. All issues noted in this document were discussed and addressed.  A physical exam was not performed with this format.  I connected with Andrea Moreno on 02/21/19 at 10:30 by telephone and verified that I am speaking with the correct person using two identifiers. Andrea Moreno is currently located at home and her prtner is currently with her during visit. The provider, Mary-Margaret Hassell Done, FNP is located in their office at time of visit.  I discussed the limitations, risks, security and privacy concerns of performing an evaluation and management service by telephone and the availability of in person appointments. I also discussed with the patient that there may be a patient responsible charge related to this service. The patient expressed understanding and agreed to proceed.   History and Present Illness:  patient calls in c/o sinus pressure and headache that started over a week ago. She says she has not been out of the house in several weeks. She was tesated for covid at health department in [facility] and was negative. Feels no better. Has very poor appetite.   Review of Systems  Constitutional: Negative for chills, diaphoresis, fever and weight loss.  HENT: Positive for sinus pain. Negative for sore throat.   Eyes: Negative for blurred vision, double vision and pain.  Respiratory: Negative for cough and shortness of breath.   Cardiovascular: Negative for chest pain, palpitations, orthopnea and leg swelling.  Gastrointestinal: Negative for abdominal pain.  Skin: Negative for rash.  Neurological: Positive for headaches. Negative for dizziness,  sensory change, loss of consciousness and weakness.  Endo/Heme/Allergies: Negative for polydipsia. Does not bruise/bleed easily.  Psychiatric/Behavioral: Negative for memory loss. The patient does not have insomnia.   All other systems reviewed and are negative.    Observations/Objective Alert and oriented- answers all questions appropriately mild distress   Assessment and Plan: [name] in today with chief complaint of Sinusitis   1. Acute maxillary sinusitis, recurrence not specified 1. Take meds as prescribed 2. Use a cool mist humidifier especially during the winter months and when heat has been humid. 3. Use saline nose sprays frequently 4. Saline irrigations of the nose can be very helpful if done frequently.  * 4X daily for 1 week*  * Use of a nettie pot can be helpful with this. Follow directions with this* 5. Drink plenty of fluids 6. Keep thermostat turn down low 7.For any cough or congestion  Use plain Mucinex- regular strength or max strength is fine   * Children- consult with Pharmacist for dosing 8. For fever or aces or pains- take tylenol or ibuprofen appropriate for age and weight.  * for fevers greater than 101 orally you may alternate ibuprofen and tylenol every  3 hours.   Meds ordered this encounter  Medications  . amoxicillin-clavulanate (AUGMENTIN) 875-125 MG tablet    Sig: Take 1 tablet by mouth 2 (two) times daily.    Dispense:  14 tablet    Refill:  0    Order Specific Question:   Supervising Provider    Answer:   Caryl Pina A [id number]       Follow Up Instructions: prn    I discussed the assessment and treatment plan  with the patient. The patient was provided an opportunity to ask questions and all were answered. The patient agreed with the plan and demonstrated an understanding of the instructions.   The patient was advised to call back or seek an in-person evaluation if the symptoms worsen or if the condition fails to  improve as anticipated.  The above assessment and management plan was discussed with the patient. The patient verbalized understanding of and has agreed to the management plan. Patient is aware to call the clinic if symptoms persist or worsen. Patient is aware when to return to the clinic for a follow-up visit. Patient educated on when it is appropriate to go to the emergency department.   Time call ended:  10:40  I provided 10 minutes of non-face-to-face time during this encounter.    Mary-Margaret Hassell Done, FNP

## 2019-02-25 DIAGNOSIS — Z23 Encounter for immunization: Secondary | ICD-10-CM | POA: Diagnosis not present

## 2019-03-21 ENCOUNTER — Other Ambulatory Visit: Payer: Self-pay | Admitting: Family

## 2019-03-21 NOTE — Telephone Encounter (Signed)
[place]. NTBS 30 days given 02/17/19

## 2019-03-21 NOTE — Telephone Encounter (Signed)
Provider out of office today.  To be addressed on Tuesday.     Patient had office visit 08-12-2018 and had two televisits after that.  Please advise if refill on gabapentin will be considered.

## 2019-03-22 MED ORDER — GABAPENTIN 100 MG PO CAPS: ORAL_CAPSULE | ORAL | 3 refills | Status: DC

## 2019-03-22 NOTE — Addendum Note (Signed)
Addended by: Evelina Dun A on: 03/22/2019 08:20 AM   Modules accepted: Orders

## 2019-03-23 ENCOUNTER — Encounter: Payer: Self-pay | Admitting: Family

## 2019-03-23 ENCOUNTER — Ambulatory Visit (INDEPENDENT_AMBULATORY_CARE_PROVIDER_SITE_OTHER): Payer: Medicare Other | Admitting: Family

## 2019-03-23 DIAGNOSIS — E559 Vitamin D deficiency, unspecified: Secondary | ICD-10-CM

## 2019-03-23 DIAGNOSIS — F411 Generalized anxiety disorder: Secondary | ICD-10-CM | POA: Diagnosis not present

## 2019-03-23 DIAGNOSIS — M545 Low back pain, unspecified: Secondary | ICD-10-CM

## 2019-03-23 DIAGNOSIS — M159 Polyosteoarthritis, unspecified: Secondary | ICD-10-CM | POA: Diagnosis not present

## 2019-03-23 DIAGNOSIS — Z8673 Personal history of transient ischemic attack (TIA), and cerebral infarction without residual deficits: Secondary | ICD-10-CM

## 2019-03-23 DIAGNOSIS — G8929 Other chronic pain: Secondary | ICD-10-CM

## 2019-03-23 DIAGNOSIS — G4700 Insomnia, unspecified: Secondary | ICD-10-CM | POA: Diagnosis not present

## 2019-03-23 DIAGNOSIS — I10 Essential (primary) hypertension: Secondary | ICD-10-CM

## 2019-03-23 DIAGNOSIS — M8580 Other specified disorders of bone density and structure, unspecified site: Secondary | ICD-10-CM | POA: Diagnosis not present

## 2019-03-23 DIAGNOSIS — E785 Hyperlipidemia, unspecified: Secondary | ICD-10-CM | POA: Diagnosis not present

## 2019-03-23 DIAGNOSIS — E663 Overweight: Secondary | ICD-10-CM

## 2019-03-23 MED ORDER — BUSPIRONE HCL 5 MG PO TABS: ORAL_TABLET | ORAL | 2 refills | Status: DC

## 2019-03-23 MED ORDER — RALOXIFENE HCL 60 MG PO TABS: 60.0000 mg | ORAL_TABLET | Freq: Every day | ORAL | 3 refills | Status: DC

## 2019-03-23 MED ORDER — GABAPENTIN 100 MG PO CAPS: ORAL_CAPSULE | ORAL | 3 refills | Status: DC

## 2019-03-23 MED ORDER — LOSARTAN POTASSIUM 100 MG PO TABS: 100.0000 mg | ORAL_TABLET | Freq: Every day | ORAL | 3 refills | Status: DC

## 2019-03-23 NOTE — Progress Notes (Signed)
Virtual Visit via telephone Note Due to COVID-19 pandemic this visit was conducted virtually. This visit type was conducted due to national recommendations for restrictions regarding the COVID-19 Pandemic (e.g. social distancing, sheltering in place) in an effort to limit this patient's exposure and mitigate transmission in our community. All issues noted in this document were discussed and addressed.  A physical exam was not performed with this format.  I connected with Andrea Moreno on 03/23/19 at 12:12 pm by telephone and verified that I am speaking with the correct person using two identifiers. Andrea Moreno is currently located at home and no one is currently with her during visit. The provider, Evelina Dun, FNP is located in their office at time of visit.  I discussed the limitations, risks, security and privacy concerns of performing an evaluation and management service by telephone and the availability of in person appointments. I also discussed with the patient that there may be a patient responsible charge related to this service. The patient expressed understanding and agreed to proceed.   History and Present Illness:  Hypertension This is a chronic problem. The current episode started more than 1 year ago. The problem has been resolved since onset. The problem is controlled. Associated symptoms include anxiety. Pertinent negatives include no headaches, malaise/fatigue, peripheral edema or shortness of breath. Risk factors for coronary artery disease include dyslipidemia and sedentary lifestyle. The current treatment provides moderate improvement. Hypertensive end-organ damage includes CVA. There is no history of kidney disease, CAD/MI or heart failure.  Back Pain This is a chronic problem. The current episode started more than 1 year ago. The problem occurs intermittently. The problem has been waxing and waning since onset. The pain is present in the lumbar spine. The quality  of the pain is described as burning and shooting. The pain is at a severity of 2/10 (if working and standing it will increase to a 8-9 out 10). The pain is moderate. Pertinent negatives include no headaches. The treatment provided mild relief.  Anxiety Presents for follow-up visit. Symptoms include excessive worry. Patient reports no depressed mood, dizziness, insomnia, irritability, nervous/anxious behavior or shortness of breath. Symptoms occur most days. The severity of symptoms is moderate. The quality of sleep is good.    Hyperlipidemia This is a chronic problem. The current episode started more than 1 year ago. The problem is uncontrolled. Recent lipid tests were reviewed and are high. Pertinent negatives include no shortness of breath. Current antihyperlipidemic treatment includes herbal therapy. The current treatment provides mild improvement of lipids. Risk factors for coronary artery disease include dyslipidemia, hypertension, a sedentary lifestyle and post-menopausal.  Osteopenia PT taking Evista daily and tolerate well. Last Dexa scan 10/21/16.     Review of Systems  Constitutional: Negative for irritability and malaise/fatigue.  Respiratory: Negative for shortness of breath.   Musculoskeletal: Positive for back pain.  Neurological: Negative for dizziness and headaches.  Psychiatric/Behavioral: The patient is not nervous/anxious and does not have insomnia.      Observations/Objective: No SOB or distress noted  Assessment and Plan: Andrea Moreno comes in today with chief complaint of No chief complaint on file.   Diagnosis and orders addressed:  1. Essential hypertension - losartan (COZAAR) 100 MG tablet; Take 1 tablet (100 mg total) by mouth daily.  Dispense: 90 tablet; Refill: 3 - CBC with Differential/Platelet - CMP14+EGFR  2. Osteoarthritis of multiple joints, unspecified osteoarthritis type - CBC with Differential/Platelet - CMP14+EGFR  3. Overweight (BMI  25.0-29.9) -  CBC with Differential/Platelet - CMP14+EGFR  4. Insomnia, unspecified type - CBC with Differential/Platelet - CMP14+EGFR  5. Chronic bilateral low back pain, unspecified whether sciatica present - gabapentin (NEURONTIN) 100 MG capsule; TAKE UP TO 6 CAPSULES AT BEDTIME AS DIRECTED.  Dispense: 150 capsule; Refill: 3 - CBC with Differential/Platelet - CMP14+EGFR  6. GAD (generalized anxiety disorder) - busPIRone (BUSPAR) 5 MG tablet; TAKE  (1)  TABLET  THREE TIMES DAILY.  Dispense: 90 tablet; Refill: 2 - CBC with Differential/Platelet - CMP14+EGFR  7. History of TIA (transient ischemic attack) - CBC with Differential/Platelet - CMP14+EGFR  8. Hyperlipidemia, unspecified hyperlipidemia type - CBC with Differential/Platelet - CMP14+EGFR - Lipid panel  9. Vitamin D deficiency - CBC with Differential/Platelet - CMP14+EGFR - VITAMIN D 25 Hydroxy (Vit-D Deficiency, Fractures)  10. Osteopenia, unspecified location - raloxifene (EVISTA) 60 MG tablet; Take 1 tablet (60 mg total) by mouth daily.  Dispense: 90 tablet; Refill: 3 - CBC with Differential/Platelet - CMP14+EGFR   Labs pending Health Maintenance reviewed Diet and exercise encouraged  Follow up plan: 6 months      I discussed the assessment and treatment plan with the patient. The patient was provided an opportunity to ask questions and all were answered. The patient agreed with the plan and demonstrated an understanding of the instructions.   The patient was advised to call back or seek an in-person evaluation if the symptoms worsen or if the condition fails to improve as anticipated.  The above assessment and management plan was discussed with the patient. The patient verbalized understanding of and has agreed to the management plan. Patient is aware to call the clinic if symptoms persist or worsen. Patient is aware when to return to the clinic for a follow-up visit. Patient educated on when it is  appropriate to go to the emergency department.   Time call ended:  12:30 pm  I provided 18 minutes of non-face-to-face time during this encounter.    Evelina Dun, FNP

## 2019-03-24 ENCOUNTER — Other Ambulatory Visit: Payer: Medicare Other

## 2019-03-24 ENCOUNTER — Other Ambulatory Visit: Payer: Self-pay

## 2019-03-24 DIAGNOSIS — E785 Hyperlipidemia, unspecified: Secondary | ICD-10-CM | POA: Diagnosis not present

## 2019-03-24 DIAGNOSIS — F411 Generalized anxiety disorder: Secondary | ICD-10-CM | POA: Diagnosis not present

## 2019-03-24 DIAGNOSIS — G4700 Insomnia, unspecified: Secondary | ICD-10-CM | POA: Diagnosis not present

## 2019-03-24 DIAGNOSIS — M159 Polyosteoarthritis, unspecified: Secondary | ICD-10-CM | POA: Diagnosis not present

## 2019-03-24 DIAGNOSIS — I10 Essential (primary) hypertension: Secondary | ICD-10-CM | POA: Diagnosis not present

## 2019-03-24 DIAGNOSIS — G8929 Other chronic pain: Secondary | ICD-10-CM | POA: Diagnosis not present

## 2019-03-24 DIAGNOSIS — Z8673 Personal history of transient ischemic attack (TIA), and cerebral infarction without residual deficits: Secondary | ICD-10-CM | POA: Diagnosis not present

## 2019-03-24 DIAGNOSIS — E663 Overweight: Secondary | ICD-10-CM | POA: Diagnosis not present

## 2019-03-24 DIAGNOSIS — M8580 Other specified disorders of bone density and structure, unspecified site: Secondary | ICD-10-CM | POA: Diagnosis not present

## 2019-03-24 DIAGNOSIS — M545 Low back pain: Secondary | ICD-10-CM | POA: Diagnosis not present

## 2019-03-24 DIAGNOSIS — E559 Vitamin D deficiency, unspecified: Secondary | ICD-10-CM | POA: Diagnosis not present

## 2019-03-25 LAB — CMP14+EGFR
ALT: 13 IU/L (ref 0–32)
AST: 17 IU/L (ref 0–40)
Albumin/Globulin Ratio: 2 (ref 1.2–2.2)
Albumin: 4.1 g/dL (ref 3.7–4.7)
Alkaline Phosphatase: 39 IU/L (ref 39–117)
BUN/Creatinine Ratio: 21 (ref 12–28)
BUN: 18 mg/dL (ref 8–27)
Bilirubin Total: 0.6 mg/dL (ref 0.0–1.2)
CO2: 23 mmol/L (ref 20–29)
Calcium: 9.5 mg/dL (ref 8.7–10.3)
Chloride: 105 mmol/L (ref 96–106)
Creatinine, Ser: 0.84 mg/dL (ref 0.57–1.00)
GFR calc Af Amer: 77 mL/min/{1.73_m2} (ref >59)
GFR calc non Af Amer: 67 mL/min/{1.73_m2} (ref >59)
Globulin, Total: 2.1 g/dL (ref 1.5–4.5)
Glucose: 96 mg/dL (ref 65–99)
Potassium: 3.9 mmol/L (ref 3.5–5.2)
Sodium: 140 mmol/L (ref 134–144)
Total Protein: 6.2 g/dL (ref 6.0–8.5)

## 2019-03-25 LAB — VITAMIN D 25 HYDROXY (VIT D DEFICIENCY, FRACTURES): Vit D, 25-Hydroxy: 33.5 ng/mL (ref 30.0–100.0)

## 2019-03-25 LAB — CBC WITH DIFFERENTIAL/PLATELET
Basophils Absolute: 0.1 10*3/uL (ref 0.0–0.2)
Basos: 2 %
EOS (ABSOLUTE): 0.2 10*3/uL (ref 0.0–0.4)
Eos: 6 %
Hematocrit: 37.3 % (ref 34.0–46.6)
Hemoglobin: 12.3 g/dL (ref 11.1–15.9)
Immature Grans (Abs): 0 10*3/uL (ref 0.0–0.1)
Immature Granulocytes: 0 %
Lymphocytes Absolute: 1.2 10*3/uL (ref 0.7–3.1)
Lymphs: 34 %
MCH: 30.6 pg (ref 26.6–33.0)
MCHC: 33 g/dL (ref 31.5–35.7)
MCV: 93 fL (ref 79–97)
Monocytes Absolute: 0.4 10*3/uL (ref 0.1–0.9)
Monocytes: 11 %
Neutrophils Absolute: 1.7 10*3/uL (ref 1.4–7.0)
Neutrophils: 47 %
Platelets: 223 10*3/uL (ref 150–450)
RBC: 4.02 x10E6/uL (ref 3.77–5.28)
RDW: 13.1 % (ref 11.7–15.4)
WBC: 3.6 10*3/uL (ref 3.4–10.8)

## 2019-03-25 LAB — LIPID PANEL
Chol/HDL Ratio: 3.2 ratio (ref 0.0–4.4)
Cholesterol, Total: 197 mg/dL (ref 100–199)
HDL: 62 mg/dL (ref >39)
LDL Chol Calc (NIH): 116 mg/dL — ABNORMAL HIGH (ref 0–99)
Triglycerides: 105 mg/dL (ref 0–149)
VLDL Cholesterol Cal: 19 mg/dL (ref 5–40)

## 2019-03-31 ENCOUNTER — Other Ambulatory Visit: Payer: Self-pay | Admitting: Family

## 2019-03-31 MED ORDER — VASCEPA 1 G PO CAPS: 2.0000 g | ORAL_CAPSULE | Freq: Two times a day (BID) | ORAL | 2 refills | Status: DC

## 2019-07-25 IMAGING — MR MRI LUMBAR SPINE WITHOUT CONTRAST
4 of 5 series · 15 of 48 positions shown · non-contrast
Comparison: Lumbar spine x-rays dated January 25, 2018. MRI lumbar
spine dated May 19, 2014.

CLINICAL DATA: Chronic low back pain.

EXAM:
MRI LUMBAR SPINE WITHOUT CONTRAST
TECHNIQUE: Multiplanar, multisequence MR imaging of the lumbar spine was
performed. No intravenous contrast was administered.

[Series 3: T2 · sagittal · 4.0mm · 0.58mm/px · 6 of 17 slices shown (1 of 2)]
[im 1/17]
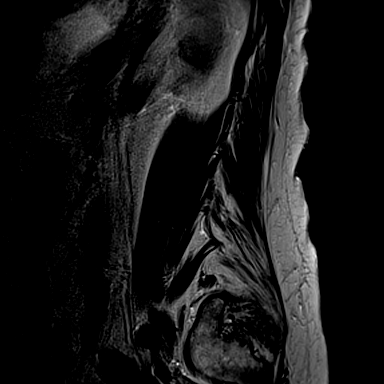
[im 4/17]
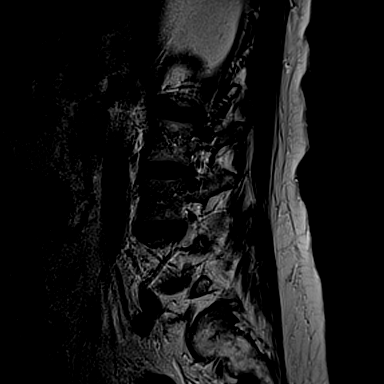
[im 7/17]
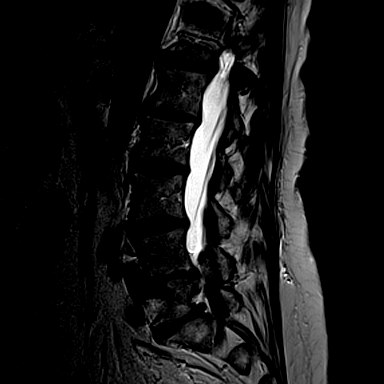
[im 10/17]
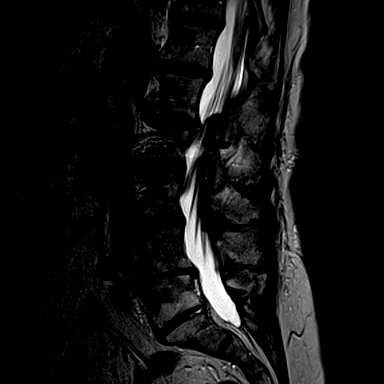
[im 13/17]
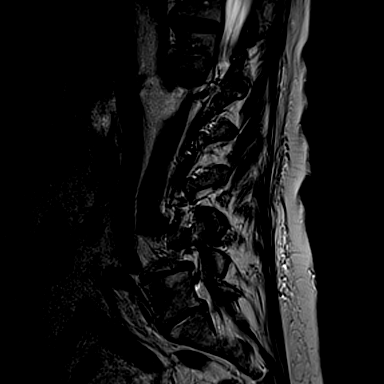
[im 17/17]
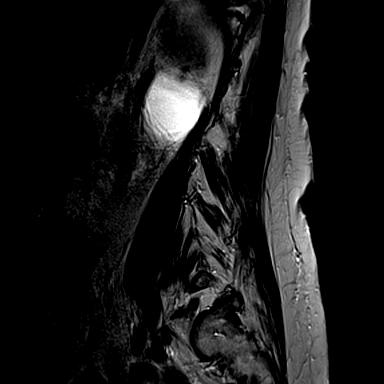

[Series 4: T1 · sagittal · 4.0mm · 0.29mm/px · 3 of 15 slices shown (1 of 2)]
[im 3/15]
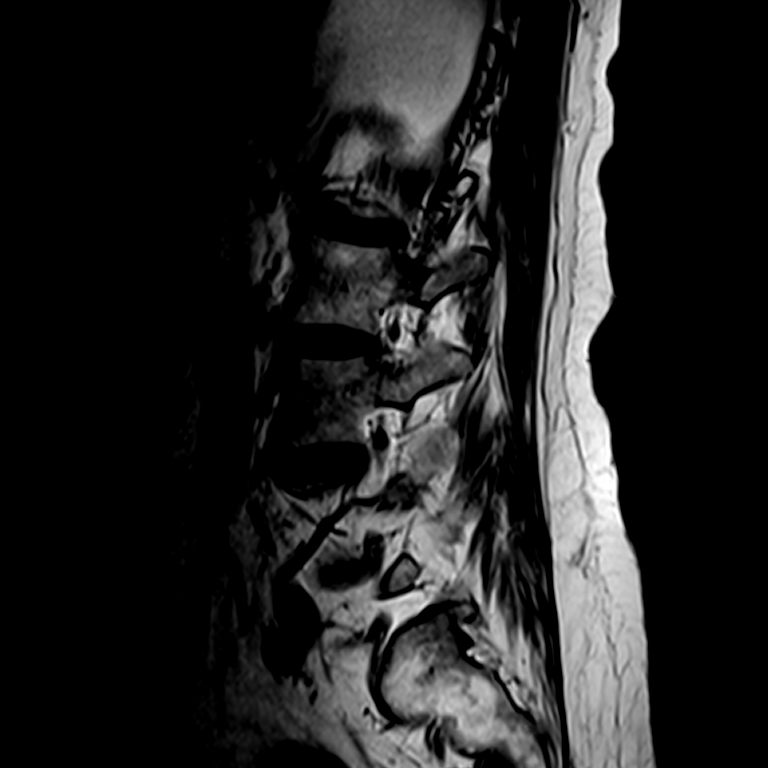
[im 9/15]
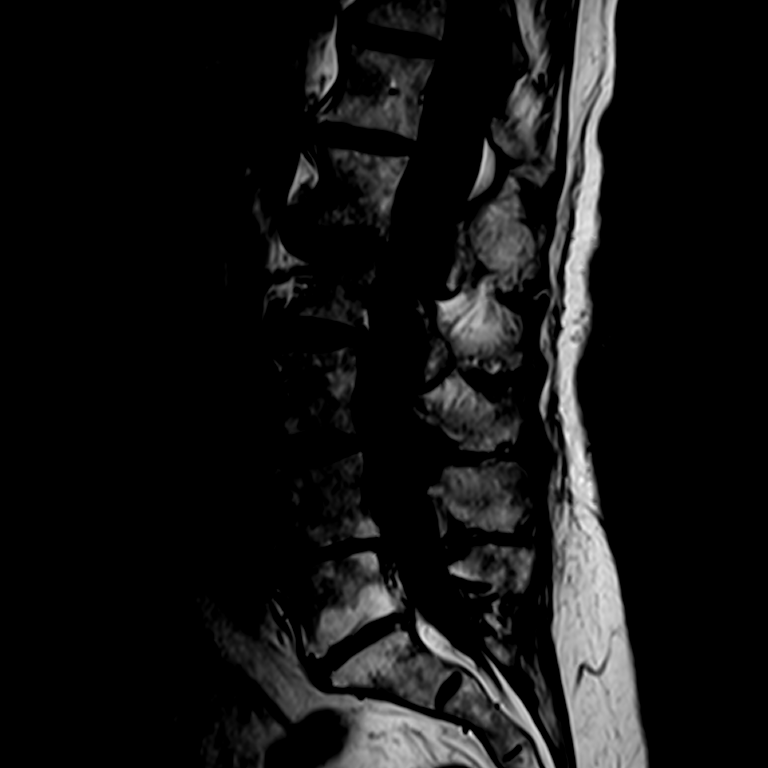
[im 15/15]
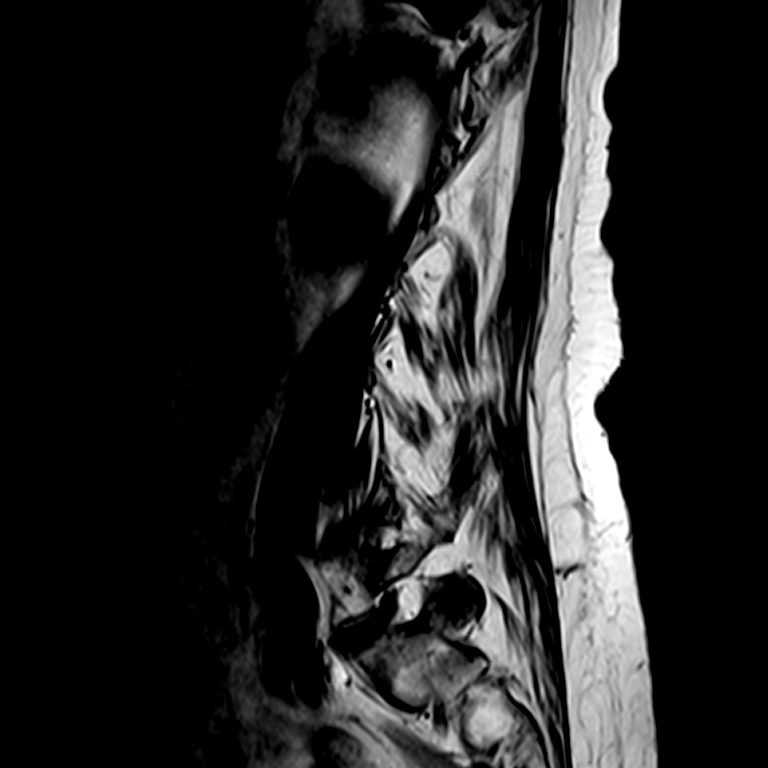

[Series 6: T2 · axial · 4.0mm · 0.24mm/px · z∈[-12,+98]mm · 3 of 37 slices shown (2 of 2)]
[im 6/37]
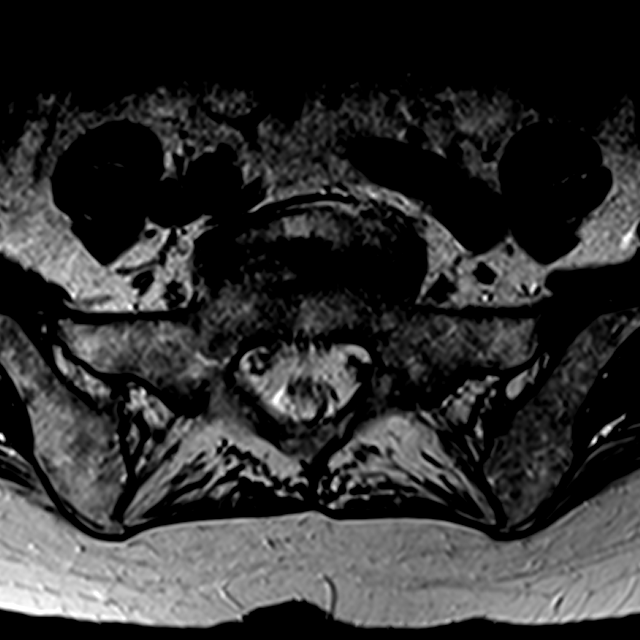
[im 19/37]
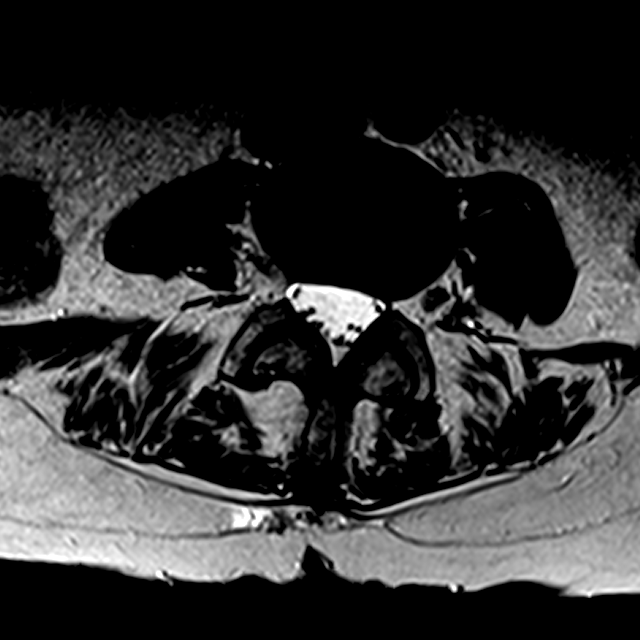
[im 31/37]
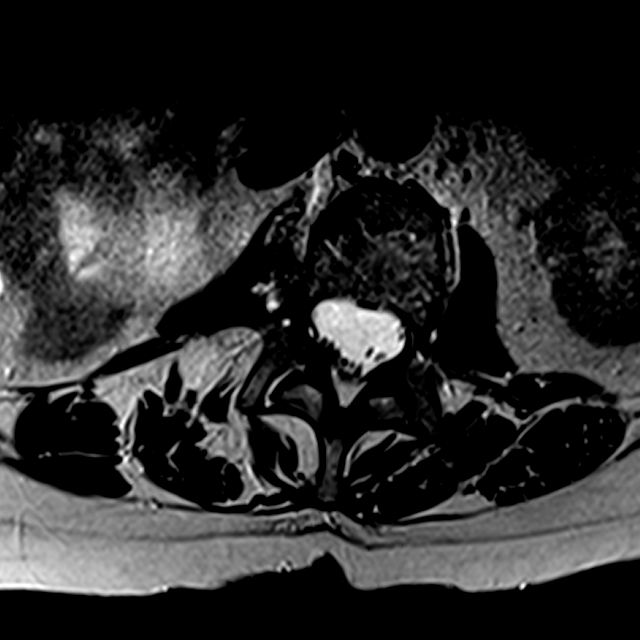

[Series 7: T1 · axial · 4.0mm · 0.24mm/px · z∈[-12,+98]mm · 3 of 37 slices shown (2 of 2)]
[im 6/37]
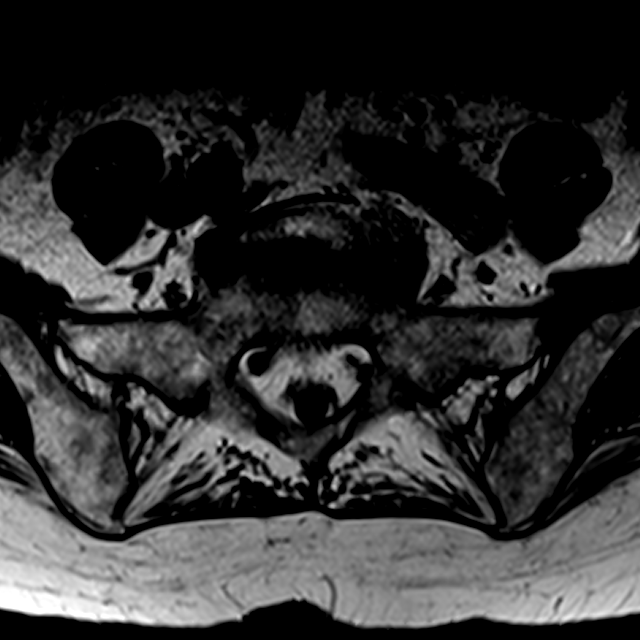
[im 19/37]
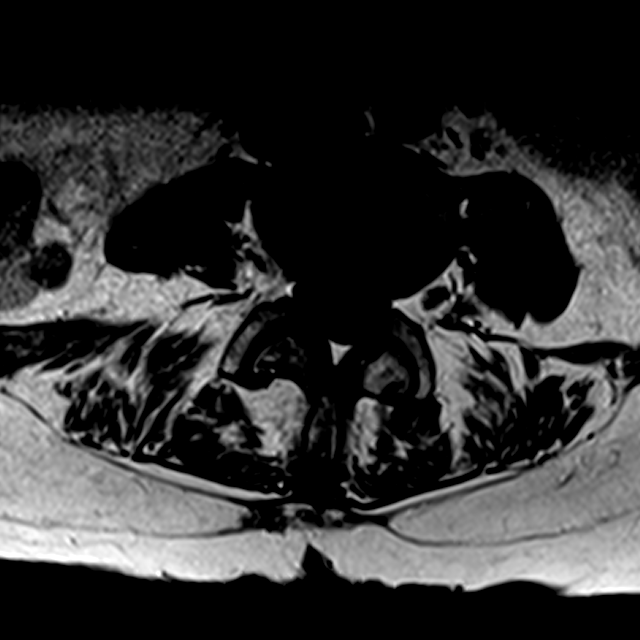
[im 31/37]
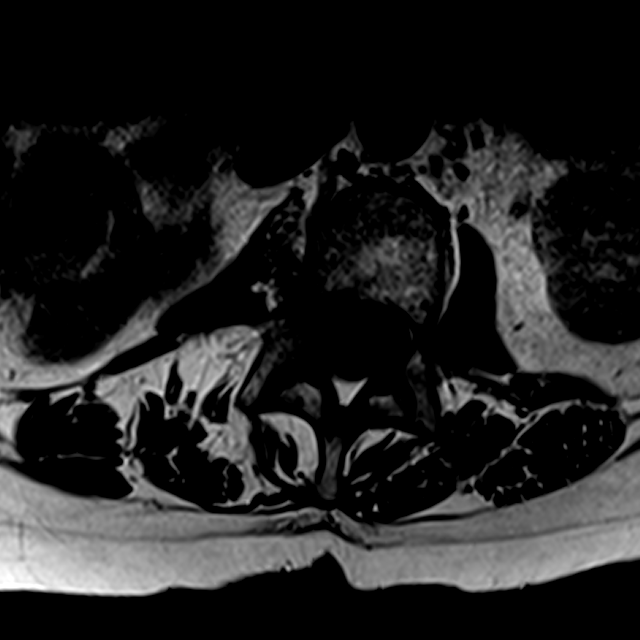

[15 of 48 positions shown; findings below may reference images not displayed]

FINDINGS: Segmentation:  Standard.

Alignment: Unchanged levoscoliosis. Unchanged trace retrolisthesis
at L4-L5.

Vertebrae: No fracture, evidence of discitis, or bone lesion.
Degenerative endplate marrow changes at L1-L2, L4-L5, and L5-S1.

Conus medullaris and cauda equina: Conus extends to the T12 level.
Conus and cauda equina appear normal.

Paraspinal and other soft tissues: Right renal cyst. Right
parapelvic cyst has increased in size.

Disc levels:

T12-L1:  Unchanged mild circumferential disc bulging.  No stenosis.

L1-L2:  Unchanged mild circumferential disc bulging.  No stenosis.

L2-L3: Unchanged minimal circumferential disc bulging. No stenosis.

L3-L4: Unchanged mild leftward disc bulging and bilateral facet
arthropathy. No stenosis.

L4-L5: Prior left hemilaminectomy. Unchanged small circumferential
disc osteophyte complex with superimposed small central and left
paracentral disc extrusion with slight caudal migration. Unchanged
mild left lateral recess stenosis. No spinal canal or neuroforaminal
stenosis.

L5-S1: Prior left hemilaminectomy. Unchanged circumferential disc
osteophyte complex and mild bilateral facet arthropathy. Previously
seen left-sided synovial cyst no longer identified. No stenosis.
IMPRESSION: 1. Mild multilevel lumbar spondylosis as described above, overall
similar to prior study. No significant stenosis or impingement.

## 2019-08-18 ENCOUNTER — Encounter: Payer: Self-pay | Admitting: Family

## 2019-08-18 ENCOUNTER — Ambulatory Visit (INDEPENDENT_AMBULATORY_CARE_PROVIDER_SITE_OTHER): Payer: Medicare Other | Admitting: Family

## 2019-08-18 ENCOUNTER — Other Ambulatory Visit: Payer: Self-pay

## 2019-08-18 VITALS — BP 139/76 | HR 57 | Temp 98.7°F | Ht 61.0 in | Wt 144.4 lb

## 2019-08-18 DIAGNOSIS — M159 Polyosteoarthritis, unspecified: Secondary | ICD-10-CM | POA: Diagnosis not present

## 2019-08-18 DIAGNOSIS — I10 Essential (primary) hypertension: Secondary | ICD-10-CM | POA: Diagnosis not present

## 2019-08-18 DIAGNOSIS — H6123 Impacted cerumen, bilateral: Secondary | ICD-10-CM | POA: Diagnosis not present

## 2019-08-18 DIAGNOSIS — F411 Generalized anxiety disorder: Secondary | ICD-10-CM | POA: Diagnosis not present

## 2019-08-18 DIAGNOSIS — E663 Overweight: Secondary | ICD-10-CM

## 2019-08-18 DIAGNOSIS — G4700 Insomnia, unspecified: Secondary | ICD-10-CM | POA: Diagnosis not present

## 2019-08-18 DIAGNOSIS — E785 Hyperlipidemia, unspecified: Secondary | ICD-10-CM | POA: Diagnosis not present

## 2019-08-18 NOTE — Patient Instructions (Signed)

## 2019-08-18 NOTE — Progress Notes (Signed)
Subjective:    Patient ID: Andrea Moreno, female    DOB: 1940-08-31, 79 y.o.   MRN: [id number]  Chief Complaint  Patient presents with  . Medical Management of Chronic Issues    Wife passed    Pt presents to the office today for chronic follow up. She reports her wife died last month. She reports she has been anxious.  Anxiety Presents for follow-up visit. Symptoms include depressed mood, excessive worry, insomnia, irritability, nervous/anxious behavior and restlessness. Patient reports no shortness of breath. Symptoms occur most days. The severity of symptoms is moderate.    Hypertension This is a chronic problem. The current episode started more than 1 year ago. The problem has been resolved since onset. The problem is controlled. Associated symptoms include anxiety and malaise/fatigue. Pertinent negatives include no peripheral edema or shortness of breath. Risk factors for coronary artery disease include dyslipidemia, obesity and sedentary lifestyle. The current treatment provides moderate improvement. There is no history of kidney disease or heart failure.  Arthritis Presents for follow-up visit. She complains of pain and stiffness. The symptoms have been stable. Affected locations include the left foot and right foot (back). Her pain is at a severity of 2/10.  Insomnia Primary symptoms: difficulty falling asleep, frequent awakening, malaise/fatigue.  The current episode started more than one year. The onset quality is gradual. The problem occurs intermittently.  Hyperlipidemia This is a chronic problem. The current episode started more than 1 year ago. The problem is uncontrolled. Recent lipid tests were reviewed and are high. Exacerbating diseases include obesity. Pertinent negatives include no shortness of breath. Current antihyperlipidemic treatment includes statins. The current treatment provides moderate improvement of lipids. Risk factors for coronary artery disease include  dyslipidemia, hypertension and a sedentary lifestyle.      Review of Systems  Constitutional: Positive for irritability and malaise/fatigue.  Respiratory: Negative for shortness of breath.   Musculoskeletal: Positive for arthritis and stiffness.  Psychiatric/Behavioral: The patient is nervous/anxious and has insomnia.   All other systems reviewed and are negative.      Objective:   Physical Exam Vitals reviewed.  Constitutional:      General: She is not in acute distress.    Appearance: She is well-developed.  HENT:     Head: Normocephalic and atraumatic.     Right Ear: There is impacted cerumen.     Left Ear: There is impacted cerumen.  Eyes:     Pupils: Pupils are equal, round, and reactive to light.  Neck:     Thyroid: No thyromegaly.  Cardiovascular:     Rate and Rhythm: Normal rate and regular rhythm.     Heart sounds: Normal heart sounds. No murmur.  Pulmonary:     Effort: Pulmonary effort is normal. No respiratory distress.     Breath sounds: Normal breath sounds. No wheezing.  Abdominal:     General: Bowel sounds are normal. There is no distension.     Palpations: Abdomen is soft.     Tenderness: There is no abdominal tenderness.  Musculoskeletal:        General: No tenderness. Normal range of motion.     Cervical back: Normal range of motion and neck supple.  Skin:    General: Skin is warm and dry.  Neurological:     Mental Status: She is alert and oriented to person, place, and time.     Cranial Nerves: No cranial nerve deficit.     Deep Tendon Reflexes: Reflexes are normal and  symmetric.  Psychiatric:        Behavior: Behavior normal.        Thought Content: Thought content normal.        Judgment: Judgment normal.    Bilateral ears washed with warm water, pt tolerated well. TM WNL   BP 139/76   Pulse (!) 57   Temp 98.7 F (37.1 C) (Temporal)   Ht '5\' 1"'  (1.549 m)   Wt 144 lb 6.4 oz (65.5 kg)   SpO2 98%   BMI 27.28 kg/m      Assessment &  Plan:  SERITA DEGROOTE comes in today with chief complaint of Medical Management of Chronic Issues (Wife passed )   Diagnosis and orders addressed:  1. Essential hypertension - CMP14+EGFR - CBC with Differential/Platelet  2. Osteoarthritis of multiple joints, unspecified osteoarthritis type - CMP14+EGFR - CBC with Differential/Platelet  3. GAD (generalized anxiety disorder) - CMP14+EGFR - CBC with Differential/Platelet  4. Hyperlipidemia, unspecified hyperlipidemia type - CMP14+EGFR - CBC with Differential/Platelet  5. Insomnia, unspecified type - CMP14+EGFR - CBC with Differential/Platelet  6. Overweight (BMI 25.0-29.9) - CMP14+EGFR - CBC with Differential/Platelet  7. Bilateral impacted cerumen   Labs pending Continue all medications, pt does not wish to change any medications at this time. Wants to wait til "things calm down" first. She will call if she wishes to increase Buspar.  Health Maintenance reviewed Diet and exercise encouraged  Follow up plan:  3 months   Evelina Dun, FNP

## 2019-08-19 LAB — CMP14+EGFR
ALT: 11 IU/L (ref 0–32)
AST: 18 IU/L (ref 0–40)
Albumin/Globulin Ratio: 2 (ref 1.2–2.2)
Albumin: 4.2 g/dL (ref 3.7–4.7)
Alkaline Phosphatase: 36 IU/L — ABNORMAL LOW (ref 39–117)
BUN/Creatinine Ratio: 18 (ref 12–28)
BUN: 14 mg/dL (ref 8–27)
Bilirubin Total: 0.4 mg/dL (ref 0.0–1.2)
CO2: 25 mmol/L (ref 20–29)
Calcium: 9.9 mg/dL (ref 8.7–10.3)
Chloride: 104 mmol/L (ref 96–106)
Creatinine, Ser: 0.8 mg/dL (ref 0.57–1.00)
GFR calc Af Amer: 82 mL/min/{1.73_m2} (ref >59)
GFR calc non Af Amer: 71 mL/min/{1.73_m2} (ref >59)
Globulin, Total: 2.1 g/dL (ref 1.5–4.5)
Glucose: 93 mg/dL (ref 65–99)
Potassium: 4 mmol/L (ref 3.5–5.2)
Sodium: 140 mmol/L (ref 134–144)
Total Protein: 6.3 g/dL (ref 6.0–8.5)

## 2019-08-19 LAB — CBC WITH DIFFERENTIAL/PLATELET
Basophils Absolute: 0.1 10*3/uL (ref 0.0–0.2)
Basos: 1 %
EOS (ABSOLUTE): 0.1 10*3/uL (ref 0.0–0.4)
Eos: 3 %
Hematocrit: 36.5 % (ref 34.0–46.6)
Hemoglobin: 12.4 g/dL (ref 11.1–15.9)
Immature Grans (Abs): 0 10*3/uL (ref 0.0–0.1)
Immature Granulocytes: 0 %
Lymphocytes Absolute: 1.3 10*3/uL (ref 0.7–3.1)
Lymphs: 30 %
MCH: 31.5 pg (ref 26.6–33.0)
MCHC: 34 g/dL (ref 31.5–35.7)
MCV: 93 fL (ref 79–97)
Monocytes Absolute: 0.3 10*3/uL (ref 0.1–0.9)
Monocytes: 8 %
Neutrophils Absolute: 2.6 10*3/uL (ref 1.4–7.0)
Neutrophils: 58 %
Platelets: 224 10*3/uL (ref 150–450)
RBC: 3.94 x10E6/uL (ref 3.77–5.28)
RDW: 13.1 % (ref 11.7–15.4)
WBC: 4.4 10*3/uL (ref 3.4–10.8)

## 2019-08-23 ENCOUNTER — Telehealth: Payer: Self-pay | Admitting: Family

## 2019-08-23 NOTE — Chronic Care Management (AMB) (Signed)
  Chronic Care Management   Note  08/23/2019 Name: TATE JERKINS MRN: [id number] DOB: 01/10/1941  JANIS SOL is a 79 y.o. year old female who is a primary care patient of Sharion Balloon, FNP. I reached out to [name] by phone today in response to a referral sent by Ms. Nilsa Nutting [employer]'s health plan.     Ms. Rightmyer was given information about Chronic Care Management services today including:  1. CCM service includes personalized support from designated clinical staff supervised by her physician, including individualized plan of care and coordination with other care providers 2. 24/7 contact phone numbers for assistance for urgent and routine care needs. 3. Service will only be billed when office clinical staff spend 20 minutes or more in a month to coordinate care. 4. Only one practitioner may furnish and bill the service in a calendar month. 5. The patient may stop CCM services at any time (effective at the end of the month) by phone call to the office staff. 6. The patient will be responsible for cost sharing (co-pay) of up to 20% of the service fee (after annual deductible is met).  Patient did not agree to enrollment in care management services and does not wish to consider at this time. Patient prefers in person visits.   Follow up plan: The patient has been provided with contact information for the care management team and has been advised to call with any health related questions or concerns.   [place], [place] [address] Direct Dial: [phone] [email or web address]: [email or web address]

## 2019-11-18 ENCOUNTER — Other Ambulatory Visit: Payer: Self-pay

## 2019-11-18 ENCOUNTER — Encounter: Payer: Self-pay | Admitting: Family

## 2019-11-18 ENCOUNTER — Ambulatory Visit (INDEPENDENT_AMBULATORY_CARE_PROVIDER_SITE_OTHER): Payer: Medicare Other | Admitting: Family

## 2019-11-18 VITALS — BP 135/76 | HR 58 | Temp 98.1°F | Ht 61.0 in | Wt 136.2 lb

## 2019-11-18 DIAGNOSIS — I10 Essential (primary) hypertension: Secondary | ICD-10-CM

## 2019-11-18 DIAGNOSIS — E785 Hyperlipidemia, unspecified: Secondary | ICD-10-CM | POA: Diagnosis not present

## 2019-11-18 DIAGNOSIS — E663 Overweight: Secondary | ICD-10-CM

## 2019-11-18 DIAGNOSIS — M159 Polyosteoarthritis, unspecified: Secondary | ICD-10-CM

## 2019-11-18 DIAGNOSIS — F411 Generalized anxiety disorder: Secondary | ICD-10-CM | POA: Diagnosis not present

## 2019-11-18 DIAGNOSIS — E559 Vitamin D deficiency, unspecified: Secondary | ICD-10-CM

## 2019-11-18 DIAGNOSIS — M8580 Other specified disorders of bone density and structure, unspecified site: Secondary | ICD-10-CM | POA: Diagnosis not present

## 2019-11-18 DIAGNOSIS — G8929 Other chronic pain: Secondary | ICD-10-CM

## 2019-11-18 DIAGNOSIS — G4700 Insomnia, unspecified: Secondary | ICD-10-CM | POA: Diagnosis not present

## 2019-11-18 DIAGNOSIS — I700 Atherosclerosis of aorta: Secondary | ICD-10-CM

## 2019-11-18 DIAGNOSIS — M545 Low back pain, unspecified: Secondary | ICD-10-CM

## 2019-11-18 MED ORDER — GABAPENTIN 100 MG PO CAPS: ORAL_CAPSULE | ORAL | 3 refills | Status: DC

## 2019-11-18 MED ORDER — LOSARTAN POTASSIUM 100 MG PO TABS: 100.0000 mg | ORAL_TABLET | Freq: Every day | ORAL | 3 refills | Status: DC

## 2019-11-18 MED ORDER — RALOXIFENE HCL 60 MG PO TABS: 60.0000 mg | ORAL_TABLET | Freq: Every day | ORAL | 3 refills | Status: DC

## 2019-11-18 MED ORDER — DOXEPIN HCL 25 MG PO CAPS: 25.0000 mg | ORAL_CAPSULE | Freq: Every day | ORAL | 1 refills | Status: DC

## 2019-11-18 NOTE — Patient Instructions (Signed)

## 2019-11-18 NOTE — Progress Notes (Signed)
Subjective:    Patient ID: Andrea Moreno, female    DOB: [birth year], 79 y.o.   MRN: [id number]  Chief Complaint  Patient presents with  . Hypertension    balance off, wt loss, wants to stop buspar    Pt presents to the office today for chronic follow up. She reports her wife died in 2019/08/25.  She reports she is doing much better today.  Hypertension This is a chronic problem. The current episode started more than 1 year ago. The problem has been resolved since onset. The problem is controlled. Associated symptoms include anxiety. Pertinent negatives include no malaise/fatigue, peripheral edema or shortness of breath. Risk factors for coronary artery disease include dyslipidemia, obesity and sedentary lifestyle. The current treatment provides moderate improvement.  Anxiety Presents for follow-up visit. Symptoms include depressed mood, excessive worry, insomnia, irritability, nervous/anxious behavior and panic. Patient reports no shortness of breath. Symptoms occur occasionally. The severity of symptoms is moderate. The quality of sleep is good.    Arthritis Presents for follow-up visit. She complains of pain and stiffness. The symptoms have been stable. Affected locations include the left foot and right foot (back). Her pain is at a severity of 2/10.  Insomnia Primary symptoms: difficulty falling asleep, frequent awakening, no malaise/fatigue.  The current episode started more than one year. The onset quality is gradual. The problem occurs intermittently. The problem has been waxing and waning since onset.  Hyperlipidemia This is a chronic problem. The current episode started more than 1 year ago. The problem is uncontrolled. Pertinent negatives include no shortness of breath. Current antihyperlipidemic treatment includes statins. The current treatment provides moderate improvement of lipids. Risk factors for coronary artery disease include dyslipidemia, hypertension, a sedentary  lifestyle and post-menopausal.  Back Pain This is a chronic problem. The current episode started more than 1 year ago. The problem occurs intermittently. The problem has been waxing and waning since onset. The pain is present in the lumbar spine. The quality of the pain is described as aching. The pain is at a severity of 2/10. The pain is mild.      Review of Systems  Constitutional: Positive for irritability. Negative for malaise/fatigue.  Respiratory: Negative for shortness of breath.   Musculoskeletal: Positive for arthritis, back pain and stiffness.  Psychiatric/Behavioral: The patient is nervous/anxious and has insomnia.   All other systems reviewed and are negative.      Objective:   Physical Exam Vitals reviewed.  Constitutional:      General: She is not in acute distress.    Appearance: She is well-developed.  HENT:     Head: Normocephalic and atraumatic.     Right Ear: Tympanic membrane normal.     Left Ear: Tympanic membrane normal.  Eyes:     Pupils: Pupils are equal, round, and reactive to light.  Neck:     Thyroid: No thyromegaly.  Cardiovascular:     Rate and Rhythm: Normal rate and regular rhythm.     Heart sounds: Normal heart sounds. No murmur heard.   Pulmonary:     Effort: Pulmonary effort is normal. No respiratory distress.     Breath sounds: Normal breath sounds. No wheezing.  Abdominal:     General: Bowel sounds are normal. There is no distension.     Palpations: Abdomen is soft.     Tenderness: There is no abdominal tenderness.  Musculoskeletal:        General: No tenderness.     Cervical back:  Normal range of motion and neck supple.     Comments: Mild pain in lumbar with flexion and extension   Skin:    General: Skin is warm and dry.  Neurological:     Mental Status: She is alert and oriented to person, place, and time.     Cranial Nerves: No cranial nerve deficit.     Deep Tendon Reflexes: Reflexes are normal and symmetric.  Psychiatric:         Behavior: Behavior normal.        Thought Content: Thought content normal.        Judgment: Judgment normal.       BP 135/76   Pulse (!) 58   Temp 98.1 F (36.7 C) (Temporal)   Ht 5\' 1"  (1.549 m)   Wt 136 lb 3.2 oz (61.8 kg)   SpO2 96%   BMI 25.73 kg/m      Assessment & Plan:  FALAN HENSLER comes in today with chief complaint of Hypertension (balance off, wt loss, wants to stop buspar )   Diagnosis and orders addressed:  1. Chronic bilateral low back pain, unspecified whether sciatica present Will decrease gabapentin to 200 mg from 400 mg  - gabapentin (NEURONTIN) 100 MG capsule; TAKE UP TO 2 CAPSULES AT BEDTIME AS DIRECTED.  Dispense: 60 capsule; Refill: 3  2. Essential hypertension - losartan (COZAAR) 100 MG tablet; Take 1 tablet (100 mg total) by mouth daily.  Dispense: 90 tablet; Refill: 3  3. GAD (generalized anxiety disorder)  4. Osteopenia, unspecified location - raloxifene (EVISTA) 60 MG tablet; Take 1 tablet (60 mg total) by mouth daily.  Dispense: 90 tablet; Refill: 3  5. Osteoarthritis of multiple joints, unspecified osteoarthritis type  6. Hyperlipidemia, unspecified hyperlipidemia type  7. Insomnia, unspecified type Will stop Buspar and start doxepin 25 mg Sleep ritual discussed - doxepin (SINEQUAN) 25 MG capsule; Take 1 capsule (25 mg total) by mouth at bedtime.  Dispense: 30 capsule; Refill: 1  8. Overweight (BMI 25.0-29.9)  9. Vitamin D deficiency  10. Aortic atherosclerosis ([place])   Will hold off on labs today Health Maintenance reviewed Diet and exercise encouraged  Follow up plan: 3 months    Evelina Dun, FNP

## 2019-12-12 ENCOUNTER — Telehealth: Payer: Self-pay | Admitting: Family

## 2019-12-12 MED ORDER — POLYETHYLENE GLYCOL 3350 17 GM/SCOOP PO POWD
17.0000 g | Freq: Two times a day (BID) | ORAL | 1 refills | Status: DC | PRN
Start: 2019-12-12 — End: 2021-06-10

## 2019-12-12 NOTE — Telephone Encounter (Signed)
Take daily, with a full glass of water.

## 2019-12-12 NOTE — Telephone Encounter (Signed)
Patient aware.  Patient would like to know how often should she take the Murelax and what time of the day? States that the Doxepin is helping a lot with her sleep but it is giving her constipation. Please advise

## 2019-12-12 NOTE — Telephone Encounter (Signed)
Refer to previous phone note 

## 2019-12-12 NOTE — Telephone Encounter (Signed)
lmtcb

## 2019-12-12 NOTE — Telephone Encounter (Signed)
Miralax Prescription sent to pharmacy

## 2019-12-13 NOTE — Telephone Encounter (Signed)
Pt advised of provider feedback and voiced understanding. 

## 2020-01-11 DIAGNOSIS — H6123 Impacted cerumen, bilateral: Secondary | ICD-10-CM | POA: Diagnosis not present

## 2020-01-12 ENCOUNTER — Other Ambulatory Visit: Payer: Self-pay | Admitting: Family

## 2020-01-12 DIAGNOSIS — G4700 Insomnia, unspecified: Secondary | ICD-10-CM

## 2020-02-21 ENCOUNTER — Encounter: Payer: Self-pay | Admitting: Family

## 2020-02-21 ENCOUNTER — Ambulatory Visit (INDEPENDENT_AMBULATORY_CARE_PROVIDER_SITE_OTHER): Payer: Medicare Other | Admitting: Family

## 2020-02-21 ENCOUNTER — Other Ambulatory Visit: Payer: Self-pay

## 2020-02-21 VITALS — BP 129/65 | HR 56 | Temp 97.6°F | Ht 61.0 in | Wt 147.0 lb

## 2020-02-21 DIAGNOSIS — E663 Overweight: Secondary | ICD-10-CM

## 2020-02-21 DIAGNOSIS — G4700 Insomnia, unspecified: Secondary | ICD-10-CM

## 2020-02-21 DIAGNOSIS — M159 Polyosteoarthritis, unspecified: Secondary | ICD-10-CM

## 2020-02-21 DIAGNOSIS — G8929 Other chronic pain: Secondary | ICD-10-CM | POA: Diagnosis not present

## 2020-02-21 DIAGNOSIS — I10 Essential (primary) hypertension: Secondary | ICD-10-CM | POA: Diagnosis not present

## 2020-02-21 DIAGNOSIS — I700 Atherosclerosis of aorta: Secondary | ICD-10-CM | POA: Diagnosis not present

## 2020-02-21 DIAGNOSIS — R2689 Other abnormalities of gait and mobility: Secondary | ICD-10-CM

## 2020-02-21 DIAGNOSIS — F411 Generalized anxiety disorder: Secondary | ICD-10-CM | POA: Diagnosis not present

## 2020-02-21 DIAGNOSIS — Z8673 Personal history of transient ischemic attack (TIA), and cerebral infarction without residual deficits: Secondary | ICD-10-CM

## 2020-02-21 DIAGNOSIS — E559 Vitamin D deficiency, unspecified: Secondary | ICD-10-CM | POA: Diagnosis not present

## 2020-02-21 DIAGNOSIS — E785 Hyperlipidemia, unspecified: Secondary | ICD-10-CM

## 2020-02-21 DIAGNOSIS — M545 Low back pain: Secondary | ICD-10-CM | POA: Diagnosis not present

## 2020-02-21 MED ORDER — DOXEPIN HCL 25 MG PO CAPS: 25.0000 mg | ORAL_CAPSULE | Freq: Every day | ORAL | 3 refills | Status: DC

## 2020-02-21 NOTE — Progress Notes (Signed)
Subjective:    Patient ID: Andrea Moreno, female    DOB: 07/02/1940, 79 y.o.   MRN: [id number]  Chief Complaint  Patient presents with  . Hypertension    3 mth, fasting, wants to discuss balance, also wants to discuss COVID booster with Flu vaccine   Pt presents to the office today for chronic follow up. She reports her wife died in 07-31-2019.  She reports she is doing much better today. She reports she is having mild balance issues. She states she is getting dizzy when she reaches above her head.  Hypertension This is a chronic problem. The current episode started more than 1 year ago. The problem has been resolved since onset. The problem is controlled. Associated symptoms include anxiety and malaise/fatigue. Pertinent negatives include no shortness of breath. Risk factors for coronary artery disease include dyslipidemia, obesity and sedentary lifestyle. The current treatment provides moderate improvement. There is no history of heart failure.  Arthritis Presents for follow-up visit. She complains of stiffness and joint swelling. The symptoms have been stable. Affected locations include the left MCP, right MCP, left hip, right hip, right knee and left knee. Her pain is at a severity of 1/10.  Insomnia Primary symptoms: difficulty falling asleep, frequent awakening, malaise/fatigue.  The current episode started more than one year. The onset quality is gradual. The problem occurs intermittently.  Hyperlipidemia This is a chronic problem. The current episode started more than 1 year ago. The problem is uncontrolled. Recent lipid tests were reviewed and are high. Pertinent negatives include no shortness of breath. Current antihyperlipidemic treatment includes diet change. The current treatment provides mild improvement of lipids. Risk factors for coronary artery disease include dyslipidemia and hypertension.  Anxiety Presents for follow-up visit. Symptoms include insomnia and  nervous/anxious behavior. Patient reports no irritability, restlessness or shortness of breath. Symptoms occur most days. The severity of symptoms is moderate.    Back Pain This is a chronic problem. The current episode started more than 1 year ago. The problem occurs intermittently. The problem has been waxing and waning since onset. The pain is present in the lumbar spine. The quality of the pain is described as aching. The pain is mild. She has tried bed rest (tylenol) for the symptoms. The treatment provided mild relief.      Review of Systems  Constitutional: Positive for malaise/fatigue. Negative for irritability.  Respiratory: Negative for shortness of breath.   Musculoskeletal: Positive for arthritis, back pain, joint swelling and stiffness.  Psychiatric/Behavioral: The patient is nervous/anxious and has insomnia.   All other systems reviewed and are negative.      Objective:   Physical Exam Vitals reviewed.  Constitutional:      General: She is not in acute distress.    Appearance: She is well-developed.  HENT:     Head: Normocephalic and atraumatic.     Right Ear: Tympanic membrane normal.     Left Ear: Tympanic membrane normal.  Eyes:     Pupils: Pupils are equal, round, and reactive to light.  Neck:     Thyroid: No thyromegaly.  Cardiovascular:     Rate and Rhythm: Normal rate and regular rhythm.     Heart sounds: Normal heart sounds. No murmur heard.   Pulmonary:     Effort: Pulmonary effort is normal. No respiratory distress.     Breath sounds: Normal breath sounds. No wheezing.  Abdominal:     General: Bowel sounds are normal. There is no distension.  Palpations: Abdomen is soft.     Tenderness: There is no abdominal tenderness.  Musculoskeletal:        General: No tenderness. Normal range of motion.     Cervical back: Normal range of motion and neck supple.  Skin:    General: Skin is warm and dry.  Neurological:     Mental Status: She is alert and  oriented to person, place, and time.     Cranial Nerves: No cranial nerve deficit.     Motor: Weakness present.     Deep Tendon Reflexes: Reflexes are normal and symmetric.     Comments: Using cane to walk  Psychiatric:        Behavior: Behavior normal.        Thought Content: Thought content normal.        Judgment: Judgment normal.       BP 129/65   Pulse (!) 56   Temp 97.6 F (36.4 C) (Temporal)   Ht _0  (1.549 m)   Wt 147 lb (66.7 kg)   SpO2 100%   BMI 27.78 kg/m      Assessment & Plan:  Andrea Moreno comes in today with chief complaint of Hypertension (3 mth, fasting, wants to discuss balance, also wants to discuss COVID booster with Flu vaccine)   Diagnosis and orders addressed:  1. Insomnia, unspecified type - doxepin (SINEQUAN) 25 MG capsule; Take 1 capsule (25 mg total) by mouth at bedtime.  Dispense: 90 capsule; Refill: 3 - CMP14+EGFR - CBC with Differential/Platelet  2. Aortic atherosclerosis ([facility]) - CMP14+EGFR - CBC with Differential/Platelet - Lipid panel  3. Essential hypertension - CMP14+EGFR - CBC with Differential/Platelet  4. Osteoarthritis of multiple joints, unspecified osteoarthritis type - CMP14+EGFR - CBC with Differential/Platelet  5. Chronic bilateral low back pain, unspecified whether sciatica present - CMP14+EGFR - CBC with Differential/Platelet  6. GAD (generalized anxiety disorder) - CMP14+EGFR - CBC with Differential/Platelet  7. History of TIA (transient ischemic attack) - CMP14+EGFR - CBC with Differential/Platelet  8. Hyperlipidemia, unspecified hyperlipidemia type - CMP14+EGFR - CBC with Differential/Platelet - Lipid panel  9. Overweight (BMI 25.0-29.9) - CMP14+EGFR - CBC with Differential/Platelet  10. Vitamin D deficiency - CMP14+EGFR - CBC with Differential/Platelet - VITAMIN D 25 Hydroxy (Vit-D Deficiency, Fractures)  11. Balance problems Pt does not wish to have referral to PT at this time and  wants to see where her sister goes right now and will call back. We also gave her the number of local [facility] that does senior citizen programs. Fall preventions discussed  - CMP14+EGFR - CBC with Differential/Platelet   Labs pending Health Maintenance reviewed Diet and exercise encouraged  Follow up plan: 3 months    ;Evelina Dun, FNP

## 2020-02-21 NOTE — Patient Instructions (Signed)
Dizziness Dizziness is a common problem. It is a feeling of unsteadiness or light-headedness. You may feel like you are about to faint. Dizziness can lead to injury if you stumble or fall. Anyone can become dizzy, but dizziness is more common in older adults. This condition can be caused by a number of things, including medicines, dehydration, or illness. Follow these instructions at home: Eating and drinking  Drink enough fluid to keep your urine clear or pale yellow. This helps to keep you from becoming dehydrated. Try to drink more clear fluids, such as water.  Do not drink alcohol.  Limit your caffeine intake if told to do so by your health care provider. Check ingredients and nutrition facts to see if a food or beverage contains caffeine.  Limit your salt (sodium) intake if told to do so by your health care provider. Check ingredients and nutrition facts to see if a food or beverage contains sodium. Activity  Avoid making quick movements. ? Rise slowly from chairs and steady yourself until you feel okay. ? In the morning, first sit up on the side of the bed. When you feel okay, stand slowly while you hold onto something until you know that your balance is fine.  If you need to stand in one place for a long time, move your legs often. Tighten and relax the muscles in your legs while you are standing.  Do not drive or use heavy machinery if you feel dizzy.  Avoid bending down if you feel dizzy. Place items in your home so that they are easy for you to reach without leaning over. Lifestyle  Do not use any products that contain nicotine or tobacco, such as cigarettes and e-cigarettes. If you need help quitting, ask your health care provider.  Try to reduce your stress level by using methods such as yoga or meditation. Talk with your health care provider if you need help to manage your stress. General instructions  Watch your dizziness for any changes.  Take over-the-counter and  prescription medicines only as told by your health care provider. Talk with your health care provider if you think that your dizziness is caused by a medicine that you are taking.  Tell a friend or a family member that you are feeling dizzy. If he or she notices any changes in your behavior, have this person call your health care provider.  Keep all follow-up visits as told by your health care provider. This is important. Contact a health care provider if:  Your dizziness does not go away.  Your dizziness or light-headedness gets worse.  You feel nauseous.  You have reduced hearing.  You have new symptoms.  You are unsteady on your feet or you feel like the room is spinning. Get help right away if:  You vomit or have diarrhea and are unable to eat or drink anything.  You have problems talking, walking, swallowing, or using your arms, hands, or legs.  You feel generally weak.  You are not thinking clearly or you have trouble forming sentences. It may take a friend or family member to notice this.  You have chest pain, abdominal pain, shortness of breath, or sweating.  Your vision changes.  You have any bleeding.  You have a severe headache.  You have neck pain or a stiff neck.  You have a fever. These symptoms may represent a serious problem that is an emergency. Do not wait to see if the symptoms will go away. Get medical help   right away. Call your local emergency services (911 in the [place] Do not drive yourself to the hospital. Summary  Dizziness is a feeling of unsteadiness or light-headedness. This condition can be caused by a number of things, including medicines, dehydration, or illness.  Anyone can become dizzy, but dizziness is more common in older adults.  Drink enough fluid to keep your urine clear or pale yellow. Do not drink alcohol.  Avoid making quick movements if you feel dizzy. Monitor your dizziness for any changes. This information is not intended to  replace advice given to you by your health care provider. Make sure you discuss any questions you have with your health care provider. Document Revised: 05/22/2017 Document Reviewed: 06/21/2016 Elsevier Patient Education  2020 Elsevier Inc.  

## 2020-02-22 LAB — VITAMIN D 25 HYDROXY (VIT D DEFICIENCY, FRACTURES): Vit D, 25-Hydroxy: 37.9 ng/mL (ref 30.0–100.0)

## 2020-02-22 LAB — CMP14+EGFR
ALT: 14 IU/L (ref 0–32)
AST: 17 IU/L (ref 0–40)
Albumin/Globulin Ratio: 2.4 — ABNORMAL HIGH (ref 1.2–2.2)
Albumin: 4.3 g/dL (ref 3.7–4.7)
Alkaline Phosphatase: 39 IU/L — ABNORMAL LOW (ref 44–121)
BUN/Creatinine Ratio: 18 (ref 12–28)
BUN: 15 mg/dL (ref 8–27)
Bilirubin Total: 0.5 mg/dL (ref 0.0–1.2)
CO2: 24 mmol/L (ref 20–29)
Calcium: 10.3 mg/dL (ref 8.7–10.3)
Chloride: 106 mmol/L (ref 96–106)
Creatinine, Ser: 0.82 mg/dL (ref 0.57–1.00)
GFR calc Af Amer: 79 mL/min/{1.73_m2} (ref >59)
GFR calc non Af Amer: 68 mL/min/{1.73_m2} (ref >59)
Globulin, Total: 1.8 g/dL (ref 1.5–4.5)
Glucose: 102 mg/dL — ABNORMAL HIGH (ref 65–99)
Potassium: 4.1 mmol/L (ref 3.5–5.2)
Sodium: 142 mmol/L (ref 134–144)
Total Protein: 6.1 g/dL (ref 6.0–8.5)

## 2020-02-22 LAB — LIPID PANEL
Chol/HDL Ratio: 2.9 ratio (ref 0.0–4.4)
Cholesterol, Total: 200 mg/dL — ABNORMAL HIGH (ref 100–199)
HDL: 69 mg/dL (ref >39)
LDL Chol Calc (NIH): 117 mg/dL — ABNORMAL HIGH (ref 0–99)
Triglycerides: 80 mg/dL (ref 0–149)
VLDL Cholesterol Cal: 14 mg/dL (ref 5–40)

## 2020-02-22 LAB — CBC WITH DIFFERENTIAL/PLATELET
Basophils Absolute: 0.1 10*3/uL (ref 0.0–0.2)
Basos: 2 %
EOS (ABSOLUTE): 0.2 10*3/uL (ref 0.0–0.4)
Eos: 5 %
Hematocrit: 37.1 % (ref 34.0–46.6)
Hemoglobin: 12.7 g/dL (ref 11.1–15.9)
Immature Grans (Abs): 0 10*3/uL (ref 0.0–0.1)
Immature Granulocytes: 0 %
Lymphocytes Absolute: 1.3 10*3/uL (ref 0.7–3.1)
Lymphs: 34 %
MCH: 30.9 pg (ref 26.6–33.0)
MCHC: 34.2 g/dL (ref 31.5–35.7)
MCV: 90 fL (ref 79–97)
Monocytes Absolute: 0.4 10*3/uL (ref 0.1–0.9)
Monocytes: 10 %
Neutrophils Absolute: 1.9 10*3/uL (ref 1.4–7.0)
Neutrophils: 49 %
Platelets: 214 10*3/uL (ref 150–450)
RBC: 4.11 x10E6/uL (ref 3.77–5.28)
RDW: 11.5 % — ABNORMAL LOW (ref 11.7–15.4)
WBC: 3.8 10*3/uL (ref 3.4–10.8)

## 2020-03-20 DIAGNOSIS — Z23 Encounter for immunization: Secondary | ICD-10-CM | POA: Diagnosis not present

## 2020-03-26 DIAGNOSIS — M47816 Spondylosis without myelopathy or radiculopathy, lumbar region: Secondary | ICD-10-CM | POA: Diagnosis not present

## 2020-03-26 DIAGNOSIS — M7061 Trochanteric bursitis, right hip: Secondary | ICD-10-CM | POA: Diagnosis not present

## 2020-03-28 DIAGNOSIS — Z23 Encounter for immunization: Secondary | ICD-10-CM | POA: Diagnosis not present

## 2020-05-22 ENCOUNTER — Ambulatory Visit (INDEPENDENT_AMBULATORY_CARE_PROVIDER_SITE_OTHER): Payer: Medicare Other | Admitting: Family

## 2020-05-22 ENCOUNTER — Encounter: Payer: Self-pay | Admitting: Family

## 2020-05-22 ENCOUNTER — Other Ambulatory Visit: Payer: Self-pay

## 2020-05-22 VITALS — BP 143/76 | HR 55 | Temp 97.4°F | Ht 61.0 in | Wt 147.8 lb

## 2020-05-22 DIAGNOSIS — G8929 Other chronic pain: Secondary | ICD-10-CM

## 2020-05-22 DIAGNOSIS — E663 Overweight: Secondary | ICD-10-CM | POA: Diagnosis not present

## 2020-05-22 DIAGNOSIS — I10 Essential (primary) hypertension: Secondary | ICD-10-CM | POA: Diagnosis not present

## 2020-05-22 DIAGNOSIS — I700 Atherosclerosis of aorta: Secondary | ICD-10-CM | POA: Diagnosis not present

## 2020-05-22 DIAGNOSIS — M545 Low back pain, unspecified: Secondary | ICD-10-CM | POA: Diagnosis not present

## 2020-05-22 DIAGNOSIS — M159 Polyosteoarthritis, unspecified: Secondary | ICD-10-CM | POA: Diagnosis not present

## 2020-05-22 DIAGNOSIS — E559 Vitamin D deficiency, unspecified: Secondary | ICD-10-CM | POA: Diagnosis not present

## 2020-05-22 DIAGNOSIS — F411 Generalized anxiety disorder: Secondary | ICD-10-CM | POA: Diagnosis not present

## 2020-05-22 DIAGNOSIS — E785 Hyperlipidemia, unspecified: Secondary | ICD-10-CM

## 2020-05-22 DIAGNOSIS — G4700 Insomnia, unspecified: Secondary | ICD-10-CM

## 2020-05-22 MED ORDER — TRAMADOL HCL 50 MG PO TABS: 50.0000 mg | ORAL_TABLET | Freq: Two times a day (BID) | ORAL | 0 refills | Status: AC | PRN

## 2020-05-22 NOTE — Patient Instructions (Addendum)
Fall Prevention in the Home, Adult Falls can cause injuries and can affect people from all age groups. There are many simple things that you can do to make your home safe and to help prevent falls. Ask for help when making these changes, if needed. What actions can I take to prevent falls? General instructions  Use good lighting in all rooms. Replace any light bulbs that burn out.  Turn on lights if it is dark. Use night-lights.  Place frequently used items in easy-to-reach places. Lower the shelves around your home if necessary.  Set up furniture so that there are clear paths around it. Avoid moving your furniture around.  Remove throw rugs and other tripping hazards from the floor.  Avoid walking on wet floors.  Fix any uneven floor surfaces.  Add color or contrast paint or tape to grab bars and handrails in your home. Place contrasting color strips on the first and last steps of stairways.  When you use a stepladder, make sure that it is completely opened and that the sides are firmly locked. Have someone hold the ladder while you are using it. Do not climb a closed stepladder.  Be aware of any and all pets. What can I do in the bathroom?      Keep the floor dry. Immediately clean up any water that spills onto the floor.  Remove soap buildup in the tub or shower on a regular basis.  Use non-skid mats or decals on the floor of the tub or shower.  Attach bath mats securely with double-sided, non-slip rug tape.  If you need to sit down while you are in the shower, use a plastic, non-slip stool.  Install grab bars by the toilet and in the tub and shower. Do not use towel bars as grab bars. What can I do in the bedroom?  Make sure that a bedside light is easy to reach.  Do not use oversized bedding that drapes onto the floor.  Have a firm chair that has side arms to use for getting dressed. What can I do in the kitchen?  Clean up any spills right away.  If you need to  reach for something above you, use a sturdy step stool that has a grab bar.  Keep electrical cables out of the way.  Do not use floor polish or wax that makes floors slippery. If you must use wax, make sure that it is non-skid floor wax. What can I do in the stairways?  Do not leave any items on the stairs.  Make sure that you have a light switch at the top of the stairs and the bottom of the stairs. Have them installed if you do not have them.  Make sure that there are handrails on both sides of the stairs. Fix handrails that are broken or loose. Make sure that handrails are as long as the stairways.  Install non-slip stair treads on all stairs in your home.  Avoid having throw rugs at the top or bottom of stairways, or secure the rugs with carpet tape to prevent them from moving.  Choose a carpet design that does not hide the edge of steps on the stairway.  Check any carpeting to make sure that it is firmly attached to the stairs. Fix any carpet that is loose or worn. What can I do on the outside of my home?  Use bright outdoor lighting.  Regularly repair the edges of walkways and driveways and fix any cracks.    Remove high doorway thresholds.  Trim any shrubbery on the main path into your home.  Regularly check that handrails are securely fastened and in good repair. Both sides of any steps should have handrails.  Install guardrails along the edges of any raised decks or porches.  Clear walkways of debris and clutter, including tools and rocks.  Have leaves, snow, and ice cleared regularly.  Use sand or salt on walkways during winter months.  In the garage, clean up any spills right away, including grease or oil spills. What other actions can I take?  Wear closed-toe shoes that fit well and support your feet. Wear shoes that have rubber soles or low heels.  Use mobility aids as needed, such as canes, walkers, scooters, and crutches.  Review your medicines with your  health care provider. Some medicines can cause dizziness or changes in blood pressure, which increase your risk of falling. Talk with your health care provider about other ways that you can decrease your risk of falls. This may include working with a physical therapist or trainer to improve your strength, balance, and endurance. Where to find more information  Centers for Disease Control and Prevention, [employer]: TVDivision.uy  [employer] on Aging: RingConnections.si Contact a health care provider if:  You are afraid of falling at home.  You feel weak, drowsy, or dizzy at home.  You fall at home. Summary  There are many simple things that you can do to make your home safe and to help prevent falls.  Ways to make your home safe include removing tripping hazards and installing grab bars in the bathroom.  Ask for help when making these changes in your home. This information is not intended to replace advice given to you by your health care provider. Make sure you discuss any questions you have with your health care provider. Document Revised: 05/01/2017 Document Reviewed: 01/01/2017 Elsevier Patient Education  2020 Elsevier Inc  . Chronic Back Pain When back pain lasts longer than 3 months, it is called chronic back pain.The cause of your back pain may not be known. Some common causes include:  Wear and tear (degenerative disease) of the bones, ligaments, or disks in your back.  Inflammation and stiffness in your back (arthritis). People who have chronic back pain often go through certain periods in which the pain is more intense (flare-ups). Many people can learn to manage the pain with home care. Follow these instructions at home: Pay attention to any changes in your symptoms. Take these actions to help with your pain: Activity   Avoid bending and other activities that make the problem worse.  Maintain a proper position when standing or sitting: ? When  standing, keep your upper back and neck straight, with your shoulders pulled back. Avoid slouching. ? When sitting, keep your back straight and relax your shoulders. Do not round your shoulders or pull them backward.  Do not sit or stand in one place for long periods of time.  Take brief periods of rest throughout the day. This will reduce your pain. Resting in a lying or standing position is usually better than sitting to rest.  When you are resting for longer periods, mix in some mild activity or stretching between periods of rest. This will help to prevent stiffness and pain.  Get regular exercise. Ask your health care provider what activities are safe for you.  Do not lift anything that is heavier than 10 lb (4.5 kg). Always use proper lifting technique, which includes: ?  Bending your knees. ? Keeping the load close to your body. ? Avoiding twisting.  Sleep on a firm mattress in a comfortable position. Try lying on your side with your knees slightly bent. If you lie on your back, put a pillow under your knees. Managing pain  If directed, apply ice to the painful area. Your health care provider may recommend applying ice during the first 24-48 hours after a flare-up begins. ? Put ice in a plastic bag. ? Place a towel between your skin and the bag. ? Leave the ice on for 20 minutes, 2-3 times per day.  If directed, apply heat to the affected area as often as told by your health care provider. Use the heat source that your health care provider recommends, such as a moist heat pack or a heating pad. ? Place a towel between your skin and the heat source. ? Leave the heat on for 20-30 minutes. ? Remove the heat if your skin turns bright red. This is especially important if you are unable to feel pain, heat, or cold. You may have a greater risk of getting burned.  Try soaking in a warm tub.  Take over-the-counter and prescription medicines only as told by your health care  provider.  Keep all follow-up visits as told by your health care provider. This is important. Contact a health care provider if:  You have pain that is not relieved with rest or medicine. Get help right away if:  You have weakness or numbness in one or both of your legs or feet.  You have trouble controlling your bladder or your bowels.  You have nausea or vomiting.  You have pain in your abdomen.  You have shortness of breath or you faint. This information is not intended to replace advice given to you by your health care provider. Make sure you discuss any questions you have with your health care provider. Document Revised: 09/09/2018 Document Reviewed: 11/26/2016 Elsevier Patient Education  [place].

## 2020-05-22 NOTE — Progress Notes (Signed)
Subjective:    Patient ID: Andrea Moreno, female    DOB: 13-Oct-1940, 79 y.o.   MRN: [id number]  Chief Complaint  Patient presents with   Hypertension   Pt presents to the office today for chronic follow up. She reports her wife diedin February 2021.She reports she is doing much better today.She reports she is having mild balance issues. She states she is getting dizzy when she reaches above her head.  She reports she has started a Tai chi class.  Hypertension This is a chronic problem. The current episode started more than 1 year ago. The problem has been waxing and waning since onset. The problem is uncontrolled. Associated symptoms include anxiety and malaise/fatigue. Pertinent negatives include no peripheral edema or shortness of breath. Risk factors for coronary artery disease include obesity and dyslipidemia. The current treatment provides moderate improvement.  Anxiety Presents for follow-up visit. Symptoms include depressed mood, excessive worry, insomnia and nervous/anxious behavior. Patient reports no shortness of breath. Symptoms occur most days. The severity of symptoms is moderate.    Arthritis Presents for follow-up visit. She complains of pain and stiffness. The symptoms have been stable. Affected locations include the right hip (back). Her pain is at a severity of 3/10.  Insomnia Primary symptoms: difficulty falling asleep, frequent awakening, malaise/fatigue.  The current episode started more than one year. The onset quality is gradual. The problem occurs intermittently. The problem has been waxing and waning since onset.  Hyperlipidemia This is a chronic problem. The current episode started more than 1 year ago. The problem is controlled. Recent lipid tests were reviewed and are normal. Pertinent negatives include no shortness of breath. Current antihyperlipidemic treatment includes diet change. The current treatment provides mild improvement of lipids. Risk factors  for coronary artery disease include dyslipidemia, hypertension, a sedentary lifestyle and post-menopausal.  Back Pain This is a chronic problem. The current episode started more than 1 year ago. The problem occurs intermittently. The problem has been waxing and waning since onset. The pain is present in the lumbar spine. The quality of the pain is described as aching. The pain is at a severity of 3/10. The pain is mild.      Review of Systems  Constitutional: Positive for malaise/fatigue.  Respiratory: Negative for shortness of breath.   Musculoskeletal: Positive for arthritis, back pain and stiffness.  Psychiatric/Behavioral: The patient is nervous/anxious and has insomnia.   All other systems reviewed and are negative.      Objective:   Physical Exam Vitals reviewed.  Constitutional:      General: She is not in acute distress.    Appearance: She is well-developed and well-nourished.  HENT:     Head: Normocephalic and atraumatic.     Right Ear: Tympanic membrane normal.     Left Ear: Tympanic membrane normal.     Mouth/Throat:     Mouth: Oropharynx is clear and moist.  Eyes:     Pupils: Pupils are equal, round, and reactive to light.  Neck:     Thyroid: No thyromegaly.  Cardiovascular:     Rate and Rhythm: Normal rate and regular rhythm.     Pulses: Intact distal pulses.     Heart sounds: Normal heart sounds. No murmur heard.   Pulmonary:     Effort: Pulmonary effort is normal. No respiratory distress.     Breath sounds: Normal breath sounds. No wheezing.  Abdominal:     General: Bowel sounds are normal. There is no distension.  Palpations: Abdomen is soft.     Tenderness: There is no abdominal tenderness.  Musculoskeletal:        General: No tenderness or edema.     Cervical back: Normal range of motion and neck supple.     Comments: Pain in lumbar with flexion and extension  Skin:    General: Skin is warm and dry.  Neurological:     Mental Status: She is  alert and oriented to person, place, and time.     Cranial Nerves: No cranial nerve deficit.     Deep Tendon Reflexes: Reflexes are normal and symmetric.  Psychiatric:        Mood and Affect: Mood and affect normal.        Behavior: Behavior normal.        Thought Content: Thought content normal.        Judgment: Judgment normal.          BP (!) 143/76 (BP Location: Right Wrist)    Pulse (!) 55    Temp (!) 97.4 F (36.3 C) (Temporal)    Ht _0  (1.549 m)    Wt 147 lb 12.8 oz (67 kg)    SpO2 99%    BMI 27.93 kg/m   Assessment & Plan:  Andrea Moreno comes in today with chief complaint of Hypertension   Diagnosis and orders addressed:  1. Primary hypertension  2. Aortic atherosclerosis ([place])  3. Osteoarthritis of multiple joints, unspecified osteoarthritis type - traMADol (ULTRAM) 50 MG tablet; Take 1 tablet (50 mg total) by mouth every 12 (twelve) hours as needed.  Dispense: 30 tablet; Refill: 0  4. Overweight (BMI 25.0-29.9)  5. Insomnia, unspecified type  6. GAD (generalized anxiety disorder)  7. Vitamin D deficiency  8. Hyperlipidemia, unspecified hyperlipidemia type   9. Chronic bilateral low back pain, unspecified whether sciatica present  - traMADol (ULTRAM) 50 MG tablet; Take 1 tablet (50 mg total) by mouth every 12 (twelve) hours as needed.  Dispense: 30 tablet; Refill: 0   Will stop gabapentin, will given Ultram as needed today. Pt reviewed in [facility] database- No red flags noted Health Maintenance reviewed Diet and exercise encouraged  Follow up plan: 3 months    Evelina Dun, FNP

## 2020-06-06 DIAGNOSIS — H2513 Age-related nuclear cataract, bilateral: Secondary | ICD-10-CM | POA: Diagnosis not present

## 2020-06-06 DIAGNOSIS — H35031 Hypertensive retinopathy, right eye: Secondary | ICD-10-CM | POA: Diagnosis not present

## 2020-06-06 DIAGNOSIS — H5213 Myopia, bilateral: Secondary | ICD-10-CM | POA: Diagnosis not present

## 2020-06-06 DIAGNOSIS — H52223 Regular astigmatism, bilateral: Secondary | ICD-10-CM | POA: Diagnosis not present

## 2020-06-06 DIAGNOSIS — H524 Presbyopia: Secondary | ICD-10-CM | POA: Diagnosis not present

## 2020-06-06 DIAGNOSIS — H3509 Other intraretinal microvascular abnormalities: Secondary | ICD-10-CM | POA: Diagnosis not present

## 2020-07-10 DIAGNOSIS — M79672 Pain in left foot: Secondary | ICD-10-CM | POA: Diagnosis not present

## 2020-07-10 DIAGNOSIS — M778 Other enthesopathies, not elsewhere classified: Secondary | ICD-10-CM | POA: Diagnosis not present

## 2020-07-10 DIAGNOSIS — M779 Enthesopathy, unspecified: Secondary | ICD-10-CM | POA: Diagnosis not present

## 2020-07-30 DIAGNOSIS — M7061 Trochanteric bursitis, right hip: Secondary | ICD-10-CM | POA: Diagnosis not present

## 2020-08-20 ENCOUNTER — Ambulatory Visit (INDEPENDENT_AMBULATORY_CARE_PROVIDER_SITE_OTHER): Payer: Medicare Other | Admitting: Family

## 2020-08-20 ENCOUNTER — Other Ambulatory Visit: Payer: Self-pay

## 2020-08-20 ENCOUNTER — Encounter: Payer: Self-pay | Admitting: Family

## 2020-08-20 VITALS — BP 139/67 | HR 72 | Temp 97.7°F | Ht 61.0 in | Wt 143.8 lb

## 2020-08-20 DIAGNOSIS — F411 Generalized anxiety disorder: Secondary | ICD-10-CM | POA: Diagnosis not present

## 2020-08-20 DIAGNOSIS — Z1159 Encounter for screening for other viral diseases: Secondary | ICD-10-CM | POA: Diagnosis not present

## 2020-08-20 DIAGNOSIS — I10 Essential (primary) hypertension: Secondary | ICD-10-CM

## 2020-08-20 DIAGNOSIS — E559 Vitamin D deficiency, unspecified: Secondary | ICD-10-CM | POA: Diagnosis not present

## 2020-08-20 DIAGNOSIS — E785 Hyperlipidemia, unspecified: Secondary | ICD-10-CM | POA: Diagnosis not present

## 2020-08-20 DIAGNOSIS — G8929 Other chronic pain: Secondary | ICD-10-CM | POA: Diagnosis not present

## 2020-08-20 DIAGNOSIS — M545 Low back pain, unspecified: Secondary | ICD-10-CM

## 2020-08-20 DIAGNOSIS — G4700 Insomnia, unspecified: Secondary | ICD-10-CM

## 2020-08-20 DIAGNOSIS — I700 Atherosclerosis of aorta: Secondary | ICD-10-CM

## 2020-08-20 DIAGNOSIS — E663 Overweight: Secondary | ICD-10-CM | POA: Diagnosis not present

## 2020-08-20 DIAGNOSIS — M159 Polyosteoarthritis, unspecified: Secondary | ICD-10-CM | POA: Diagnosis not present

## 2020-08-20 NOTE — Progress Notes (Signed)
Subjective:    Patient ID: Andrea Moreno, female    DOB: 10/20/1940, 80 y.o.   MRN: [id number]  Chief Complaint  Patient presents with  . Hypertension   Pt presents to the office today for chronic follow up. She reports her wife diedin February 2021.She reports she is doing much better today.She reports she is having mild balance issues. She states she is getting dizzy when she reaches above her head.  She reports she has started a Tai chi class.   She is followed by Ortho for osteoarthritis. She got a steroid injection in her right hip last month that has helped.  Hypertension This is a chronic problem. The current episode started more than 1 year ago. The problem has been resolved since onset. The problem is controlled. Associated symptoms include anxiety. Pertinent negatives include no malaise/fatigue, peripheral edema or shortness of breath. Risk factors for coronary artery disease include dyslipidemia and sedentary lifestyle. The current treatment provides moderate improvement.  Arthritis Presents for follow-up visit. She complains of pain and stiffness. The symptoms have been stable. Affected locations include the left knee, right knee, left hip and right hip (back). Her pain is at a severity of 1/10.  Insomnia Primary symptoms: difficulty falling asleep, no malaise/fatigue.  The current episode started more than one year. The onset quality is gradual.  Anxiety Presents for follow-up visit. Symptoms include insomnia, irritability and nervous/anxious behavior. Patient reports no shortness of breath. Symptoms occur occasionally. The severity of symptoms is moderate. The quality of sleep is good.    Back Pain This is a chronic problem. The current episode started more than 1 year ago. The problem occurs intermittently. The problem has been waxing and waning since onset. The pain is present in the lumbar spine. The quality of the pain is described as aching. The pain is at a  severity of 1/10.      Review of Systems  Constitutional: Positive for irritability. Negative for malaise/fatigue.  Respiratory: Negative for shortness of breath.   Musculoskeletal: Positive for arthritis, back pain and stiffness.  Psychiatric/Behavioral: The patient is nervous/anxious and has insomnia.   All other systems reviewed and are negative.      Objective:   Physical Exam Vitals reviewed.  Constitutional:      General: She is not in acute distress.    Appearance: She is well-developed. She is obese.  HENT:     Head: Normocephalic and atraumatic.     Right Ear: Tympanic membrane normal.     Left Ear: Tympanic membrane normal.  Eyes:     Pupils: Pupils are equal, round, and reactive to light.  Neck:     Thyroid: No thyromegaly.  Cardiovascular:     Rate and Rhythm: Normal rate and regular rhythm.     Heart sounds: Normal heart sounds. No murmur heard.   Pulmonary:     Effort: Pulmonary effort is normal. No respiratory distress.     Breath sounds: Normal breath sounds. No wheezing.  Abdominal:     General: Bowel sounds are normal. There is no distension.     Palpations: Abdomen is soft.     Tenderness: There is no abdominal tenderness.  Musculoskeletal:        General: Tenderness present. Normal range of motion.     Cervical back: Normal range of motion and neck supple.     Comments: Pain in lateral right hip with flexion  Skin:    General: Skin is warm and dry.  Neurological:     Mental Status: She is alert and oriented to person, place, and time.     Cranial Nerves: No cranial nerve deficit.     Deep Tendon Reflexes: Reflexes are normal and symmetric.  Psychiatric:        Behavior: Behavior normal.        Thought Content: Thought content normal.        Judgment: Judgment normal.       BP 139/67   Pulse 72   Temp 97.7 F (36.5 C)   Ht '5\' 1"'  (1.549 m)   Wt 143 lb 12.8 oz (65.2 kg)   SpO2 100%   BMI 27.17 kg/m      Assessment & Plan:   Andrea Moreno comes in today with chief complaint of Hypertension   Diagnosis and orders addressed:  1. Primary hypertension  2. Aortic atherosclerosis ([place])  3. Osteoarthritis of multiple joints, unspecified osteoarthritis type  4. Hyperlipidemia, unspecified hyperlipidemia typ  5. Vitamin D deficiency  6. GAD (generalized anxiety disorder)  7. Overweight (BMI 25.0-29.9)  8. Insomnia, unspecified type  9. Chronic bilateral low back pain, unspecified whether sciatica present  10. Need for hepatitis C screening test   Pt refuses labs at this time, states she will do them next visit.  Health Maintenance reviewed Diet and exercise encouraged  Follow up plan: 3 months   Evelina Dun, FNP

## 2020-08-20 NOTE — Patient Instructions (Signed)
Hip Bursitis  Hip bursitis is the inflammation of one or more bursae in the hip joint. Bursae are small fluid-filled sacs that absorb shock and prevent bones from rubbing against each other. Hip bursitis can cause mild to moderate pain, and symptoms often come and go over time. What are the causes? This condition results from increased friction between the hip bones and the tendons around the hip joint. This condition can happen if you:  Overuse your hip muscles.  Injure your hip.  Have weak buttocks muscles.  Have bone spurs.  Have an infection. In some cases, the cause may not be known. What increases the risk? You are more likely to develop this condition if:  You injured your hip previously or had hip surgery.  You have a medical condition, such as arthritis, gout, diabetes, or thyroid disease.  You have spine problems.  You have one leg that is shorter than the other.  You participate in athletic activities that include repetitive motion, like running.  You participate in sports where there is a risk of injury or falling, such as football, martial arts, or skiing. What are the signs or symptoms? Symptoms may come and go, and they often include:  Pain in the hip or groin area. Pain may get worse with movement.  Tenderness and swelling of the hip. In rare cases, the bursa may become infected. If this happens, you may get a fever, as well as warmth and redness in the hip area. How is this diagnosed? This condition may be diagnosed based on:  Your symptoms.  Your medical history.  A physical exam.  Imaging tests, such as: ? X-rays to check your bones. ? MRI or ultrasound to check your tendons and muscles. ? Bone scan.  A biopsy to remove fluid from your inflamed bursa for testing. How is this treated? This condition is treated by resting, icing, applying pressure (compression), and raising (elevating) the injured area. This is called RICE treatment. In some  cases, RICE treatment may not be enough to make your symptoms go away. Treatment may also include:  Taking medicine to help with swelling and pain.  Using crutches, a cane, or a walker to decrease the strain on your hip.  Getting a shot of cortisone medicine to help reduce swelling.  Taking other medicines if the bursa is infected.  Draining fluid out of the bursa to help relieve swelling.  Having surgery to remove a damaged or infected bursa. This is rare. Long-term treatment may include:  Physical therapy exercises for strength and flexibility.  Lifestyle changes, such as weight loss, to reduce the strain on the hip. Follow these instructions at home: Managing pain, stiffness, and swelling  If directed, put ice on the painful area. ? Put ice in a plastic bag. ? Place a towel between your skin and the bag. ? Leave the ice on for 20 minutes, 2-3 times a day.  Raise (elevate) your hip as much as you can without pain. To do this, put a pillow under your hips while you lie down.  If directed, apply heat to the affected area as often as told by your health care provider. Use the heat source that your health care provider recommends, such as a moist heat pack or a heating pad. ? Place a towel between your skin and the heat source. ? Leave the heat on for 20-30 minutes. ? Remove the heat if your skin turns bright red. This is especially important if you are unable  to feel pain, heat, or cold. You may have a greater risk of getting burned.      Activity  Do not use your hip to support your body weight until your health care provider says that you can. Use crutches, a cane, or a walker as told by your health care provider.  If the affected leg is one that you use to drive, ask your health care provider if it is safe to drive.  Rest and protect your hip as much as possible until your pain and swelling get better.  Return to your normal activities as told by your health care provider.  Ask your health care provider what activities are safe for you.  Do exercises as told by your health care provider. General instructions  Take over-the-counter and prescription medicines only as told by your health care provider.  Gently massage and stretch your injured area as often as is comfortable.  Wear compression wraps only as told by your health care provider.  If one of your legs is shorter than the other, get fitted for a shoe insert or orthotic.  Maintain a healthy weight. Follow instructions from your health care provider for weight control. These may include dietary restrictions.  Keep all follow-up visits as told by your health care provider. This is important. How is this prevented?  Exercise regularly, as told by your health care provider.  Wear supportive footwear that is appropriate for your sport.  Warm up and stretch before being active. Cool down and stretch after being active.  Take breaks regularly from repetitive activity.  If an activity irritates your hip or causes pain, avoid the activity as much as possible.  Avoid sitting down for long periods at a time. Where to find more information  American Academy [employer]opa[employer]: orthoinfo.aaos.org Contact a health care provider if:  You have a fever.  You develop new symptoms.  You have trouble walking or doing everyday activities.  You have pain that gets worse or does not get better with medicine.  You develop red skin or a feeling of warmth in your hip area. Get help right away if:  You cannot move your hip.  You have severe pain.  You cannot control the muscles in your feet. Summary  Hip bursitis is the inflammation of one or more bursae in the hip joint. Bursae are small fluid-filled sacs that absorb shock and prevent bones from rubbing against each other.  Hip bursitis can cause hip or groin pain, and symptoms often come and go over time.  This condition is often treated by  resting, icing, applying pressure (compression), and raising (elevating) the injured area. Other treatments may be needed. This information is not intended to replace advice given to you by your health care provider. Make sure you discuss any questions you have with your health care provider. Document Revised: 03/21/2019 Document Reviewed: 01/25/2018 Elsevier Patient Education  2021 [employer].

## 2020-09-25 ENCOUNTER — Ambulatory Visit (INDEPENDENT_AMBULATORY_CARE_PROVIDER_SITE_OTHER): Payer: Medicare Other | Admitting: Family

## 2020-09-25 ENCOUNTER — Encounter: Payer: Self-pay | Admitting: Family

## 2020-09-25 DIAGNOSIS — B029 Zoster without complications: Secondary | ICD-10-CM | POA: Diagnosis not present

## 2020-09-25 MED ORDER — VALACYCLOVIR HCL 1 G PO TABS: 1000.0000 mg | ORAL_TABLET | Freq: Two times a day (BID) | ORAL | 0 refills | Status: AC

## 2020-09-25 NOTE — Telephone Encounter (Signed)
Called pt appt made

## 2020-09-25 NOTE — Progress Notes (Signed)
   Virtual Visit  Note Due to COVID-19 pandemic this visit was conducted virtually. This visit type was conducted due to national recommendations for restrictions regarding the COVID-19 Pandemic (e.g. social distancing, sheltering in place) in an effort to limit this patient's exposure and mitigate transmission in our community. All issues noted in this document were discussed and addressed.  A physical exam was not performed with this format.  I connected with Andrea Moreno on 09/25/20 at 3:55 pm by telephone and verified that I am speaking with the correct person using two identifiers. Andrea Moreno is currently located at home and no one is currently with her during visit. The provider, Evelina Dun, FNP is located in their office at time of visit.  I discussed the limitations, risks, security and privacy concerns of performing an evaluation and management service by telephone and the availability of in person appointments. I also discussed with the patient that there may be a patient responsible charge related to this service. The patient expressed understanding and agreed to proceed.   History and Present Illness:  Rash This is a new problem. The current episode started in the past 7 days. The affected locations include the chest and back (left breast and radiating to back). The rash is characterized by redness. She was exposed to nothing.      Review of Systems  Skin: Positive for rash.     Observations/Objective: No SOB or distress noted   Assessment and Plan: 1. Herpes zoster without complication Avoid scratching  Start Valtrex  Keep clean and dry RTO if symptoms worsen or do not improve  - valACYclovir (VALTREX) 1000 MG tablet; Take 1 tablet (1,000 mg total) by mouth 2 (two) times daily for 10 days.  Dispense: 20 tablet; Refill: 0    I discussed the assessment and treatment plan with the patient. The patient was provided an opportunity to ask questions and all were  answered. The patient agreed with the plan and demonstrated an understanding of the instructions.   The patient was advised to call back or seek an in-person evaluation if the symptoms worsen or if the condition fails to improve as anticipated.  The above assessment and management plan was discussed with the patient. The patient verbalized understanding of and has agreed to the management plan. Patient is aware to call the clinic if symptoms persist or worsen. Patient is aware when to return to the clinic for a follow-up visit. Patient educated on when it is appropriate to go to the emergency department.   Time call ended:  4:06 pm   I provided 11 minutes of  non face-to-face time during this encounter.    Evelina Dun, FNP

## 2020-10-16 ENCOUNTER — Encounter: Payer: Self-pay | Admitting: Family

## 2020-10-16 ENCOUNTER — Ambulatory Visit (INDEPENDENT_AMBULATORY_CARE_PROVIDER_SITE_OTHER): Payer: Medicare Other | Admitting: Family

## 2020-10-16 ENCOUNTER — Other Ambulatory Visit: Payer: Self-pay

## 2020-10-16 VITALS — BP 128/76 | HR 68 | Temp 97.8°F | Ht 61.0 in | Wt 146.4 lb

## 2020-10-16 DIAGNOSIS — B372 Candidiasis of skin and nail: Secondary | ICD-10-CM

## 2020-10-16 MED ORDER — NYSTATIN 100000 UNIT/GM EX POWD: 1.0000 "application " | Freq: Three times a day (TID) | CUTANEOUS | 1 refills | Status: DC

## 2020-10-16 MED ORDER — NYSTATIN 100000 UNIT/GM EX OINT: 1.0000 "application " | TOPICAL_OINTMENT | Freq: Two times a day (BID) | CUTANEOUS | 1 refills | Status: DC

## 2020-10-16 NOTE — Patient Instructions (Signed)

## 2020-10-16 NOTE — Progress Notes (Signed)
   Subjective:    Patient ID: Andrea Moreno, female    DOB: 06/20/1940, 80 y.o.   MRN: [id number]  Chief Complaint  Patient presents with  . Herpes Zoster    Right side ? Thinks it might have spread     Rash This is a new problem. The current episode started yesterday. The problem has been gradually worsening since onset. Location: right chest. The rash is characterized by redness and pain. She was exposed to nothing. Past treatments include nothing. The treatment provided no relief.      Review of Systems  Skin: Positive for rash.  All other systems reviewed and are negative.      Objective:   Physical Exam Vitals reviewed.  Constitutional:      General: She is not in acute distress.    Appearance: She is well-developed.  HENT:     Head: Normocephalic and atraumatic.  Eyes:     Pupils: Pupils are equal, round, and reactive to light.  Neck:     Thyroid: No thyromegaly.  Cardiovascular:     Rate and Rhythm: Normal rate and regular rhythm.     Heart sounds: Normal heart sounds. No murmur heard.   Pulmonary:     Effort: Pulmonary effort is normal. No respiratory distress.     Breath sounds: Normal breath sounds. No wheezing.  Abdominal:     General: Bowel sounds are normal. There is no distension.     Palpations: Abdomen is soft.     Tenderness: There is no abdominal tenderness.  Musculoskeletal:        General: No tenderness. Normal range of motion.     Cervical back: Normal range of motion and neck supple.  Skin:    General: Skin is warm and dry.     Findings: Rash present.          Comments: Erythemas raw rash under right breast   Neurological:     Mental Status: She is alert and oriented to person, place, and time.     Cranial Nerves: No cranial nerve deficit.     Deep Tendon Reflexes: Reflexes are normal and symmetric.  Psychiatric:        Behavior: Behavior normal.        Thought Content: Thought content normal.        Judgment: Judgment normal.           BP 128/76   Pulse 68   Temp 97.8 F (36.6 C) (Temporal)   Ht 5\' 1"  (1.549 m)   Wt 146 lb 6.4 oz (66.4 kg)   SpO2 95%   BMI 27.66 kg/m   Assessment & Plan:  Andrea Moreno comes in today with chief complaint of Herpes Zoster (Right side ? Thinks it might have spread )   Diagnosis and orders addressed:  1. Candidal skin infection Keep clean and dry No not scratch Call if symptoms worsen or do not improve  - nystatin (MYCOSTATIN/NYSTOP) powder; Apply 1 application topically 3 (three) times daily.  Dispense: 90 g; Refill: 1 - nystatin ointment (MYCOSTATIN); Apply 1 application topically 2 (two) times daily.  Dispense: 90 g; Refill: [place], FNP

## 2020-10-23 DIAGNOSIS — Z23 Encounter for immunization: Secondary | ICD-10-CM | POA: Diagnosis not present

## 2020-11-22 ENCOUNTER — Ambulatory Visit (INDEPENDENT_AMBULATORY_CARE_PROVIDER_SITE_OTHER): Payer: Medicare Other | Admitting: Family

## 2020-11-22 ENCOUNTER — Other Ambulatory Visit: Payer: Self-pay

## 2020-11-22 ENCOUNTER — Encounter: Payer: Self-pay | Admitting: Family

## 2020-11-22 VITALS — BP 136/63 | HR 56 | Temp 97.6°F | Ht 61.0 in | Wt 144.2 lb

## 2020-11-22 DIAGNOSIS — F411 Generalized anxiety disorder: Secondary | ICD-10-CM

## 2020-11-22 DIAGNOSIS — G4700 Insomnia, unspecified: Secondary | ICD-10-CM | POA: Diagnosis not present

## 2020-11-22 DIAGNOSIS — E559 Vitamin D deficiency, unspecified: Secondary | ICD-10-CM

## 2020-11-22 DIAGNOSIS — E663 Overweight: Secondary | ICD-10-CM | POA: Diagnosis not present

## 2020-11-22 DIAGNOSIS — I10 Essential (primary) hypertension: Secondary | ICD-10-CM

## 2020-11-22 DIAGNOSIS — I700 Atherosclerosis of aorta: Secondary | ICD-10-CM

## 2020-11-22 DIAGNOSIS — M159 Polyosteoarthritis, unspecified: Secondary | ICD-10-CM | POA: Diagnosis not present

## 2020-11-22 DIAGNOSIS — E785 Hyperlipidemia, unspecified: Secondary | ICD-10-CM

## 2020-11-22 DIAGNOSIS — G8929 Other chronic pain: Secondary | ICD-10-CM | POA: Diagnosis not present

## 2020-11-22 DIAGNOSIS — M545 Low back pain, unspecified: Secondary | ICD-10-CM | POA: Diagnosis not present

## 2020-11-22 DIAGNOSIS — Z8673 Personal history of transient ischemic attack (TIA), and cerebral infarction without residual deficits: Secondary | ICD-10-CM

## 2020-11-22 LAB — CBC WITH DIFFERENTIAL/PLATELET
Basophils Absolute: 0.1 10*3/uL (ref 0.0–0.2)
Basos: 1 %
EOS (ABSOLUTE): 0.1 10*3/uL (ref 0.0–0.4)
Eos: 3 %
Hematocrit: 37.3 % (ref 34.0–46.6)
Hemoglobin: 12.3 g/dL (ref 11.1–15.9)
Immature Grans (Abs): 0 10*3/uL (ref 0.0–0.1)
Immature Granulocytes: 0 %
Lymphocytes Absolute: 1.3 10*3/uL (ref 0.7–3.1)
Lymphs: 25 %
MCH: 30.3 pg (ref 26.6–33.0)
MCHC: 33 g/dL (ref 31.5–35.7)
MCV: 92 fL (ref 79–97)
Monocytes Absolute: 0.4 10*3/uL (ref 0.1–0.9)
Monocytes: 8 %
Neutrophils Absolute: 3.2 10*3/uL (ref 1.4–7.0)
Neutrophils: 63 %
Platelets: 198 10*3/uL (ref 150–450)
RBC: 4.06 x10E6/uL (ref 3.77–5.28)
RDW: 11.5 % — ABNORMAL LOW (ref 11.7–15.4)
WBC: 5 10*3/uL (ref 3.4–10.8)

## 2020-11-22 LAB — CMP14+EGFR
ALT: 13 IU/L (ref 0–32)
AST: 17 IU/L (ref 0–40)
Albumin/Globulin Ratio: 2.2 (ref 1.2–2.2)
Albumin: 4.3 g/dL (ref 3.7–4.7)
Alkaline Phosphatase: 39 IU/L — ABNORMAL LOW (ref 44–121)
BUN/Creatinine Ratio: 23 (ref 12–28)
BUN: 20 mg/dL (ref 8–27)
Bilirubin Total: 0.4 mg/dL (ref 0.0–1.2)
CO2: 24 mmol/L (ref 20–29)
Calcium: 10.2 mg/dL (ref 8.7–10.3)
Chloride: 103 mmol/L (ref 96–106)
Creatinine, Ser: 0.86 mg/dL (ref 0.57–1.00)
Globulin, Total: 2 g/dL (ref 1.5–4.5)
Glucose: 99 mg/dL (ref 65–99)
Potassium: 4.4 mmol/L (ref 3.5–5.2)
Sodium: 139 mmol/L (ref 134–144)
Total Protein: 6.3 g/dL (ref 6.0–8.5)
eGFR: 69 mL/min/{1.73_m2} (ref >59)

## 2020-11-22 NOTE — Progress Notes (Signed)
Subjective:    Patient ID: GLADIOLA MADORE, female    DOB: 08-17-1940, 80 y.o.   MRN: [id number]  Chief Complaint  Patient presents with   Medical Management of Chronic Issues   Pt presents to the office today for chronic follow up. She reports her wife died in 08/19/2019.  She reports she is doing much better today. She reports she is having mild balance issues. She states she is getting dizzy when she reaches above her head.   She is followed by Ortho for osteoarthritis. She got a steroid injection in her right hip last month that has helped.  Hypertension This is a chronic problem. The current episode started more than 1 year ago. The problem has been resolved since onset. The problem is controlled. Associated symptoms include anxiety and malaise/fatigue. Pertinent negatives include no peripheral edema or shortness of breath. Risk factors for coronary artery disease include dyslipidemia, obesity and sedentary lifestyle. The current treatment provides moderate improvement. Hypertensive end-organ damage includes kidney disease. There is no history of CAD/MI, CVA or heart failure.  Arthritis Presents for follow-up visit. She complains of pain and stiffness. The symptoms have been stable. Affected locations include the left knee, right knee, left hip and right hip. Her pain is at a severity of 0/10.  Insomnia Primary symptoms: difficulty falling asleep, frequent awakening, malaise/fatigue.   The current episode started more than one year. The onset quality is gradual. The problem occurs intermittently.  Hyperlipidemia This is a chronic problem. The current episode started more than 1 year ago. The problem is controlled. Exacerbating diseases include obesity. Pertinent negatives include no shortness of breath. The current treatment provides moderate improvement of lipids. Risk factors for coronary artery disease include diabetes mellitus, dyslipidemia, hypertension and a sedentary lifestyle.   Anxiety Presents for follow-up visit. Symptoms include depressed mood, excessive worry, insomnia, irritability and nervous/anxious behavior. Patient reports no shortness of breath.       Review of Systems  Constitutional:  Positive for irritability and malaise/fatigue.  Respiratory:  Negative for shortness of breath.   Musculoskeletal:  Positive for arthritis and stiffness.  Psychiatric/Behavioral:  The patient is nervous/anxious and has insomnia.   All other systems reviewed and are negative.     Objective:   Physical Exam Vitals reviewed.  Constitutional:      General: She is not in acute distress.    Appearance: She is well-developed.  HENT:     Head: Normocephalic and atraumatic.     Right Ear: Tympanic membrane normal.     Left Ear: Tympanic membrane normal.  Eyes:     Pupils: Pupils are equal, round, and reactive to light.  Neck:     Thyroid: No thyromegaly.  Cardiovascular:     Rate and Rhythm: Normal rate and regular rhythm.     Heart sounds: Normal heart sounds. No murmur heard. Pulmonary:     Effort: Pulmonary effort is normal. No respiratory distress.     Breath sounds: Normal breath sounds. No wheezing.  Abdominal:     General: Bowel sounds are normal. There is no distension.     Palpations: Abdomen is soft.     Tenderness: There is no abdominal tenderness.  Musculoskeletal:        General: No tenderness. Normal range of motion.     Cervical back: Normal range of motion and neck supple.  Skin:    General: Skin is warm and dry.  Neurological:     Mental Status: She is  alert and oriented to person, place, and time.     Cranial Nerves: No cranial nerve deficit.     Motor: Weakness present.     Gait: Gait abnormal.     Deep Tendon Reflexes: Reflexes are normal and symmetric.  Psychiatric:        Behavior: Behavior normal.        Thought Content: Thought content normal.        Judgment: Judgment normal.      BP 136/63   Pulse (!) 56   Temp 97.6 F  (36.4 C) (Oral)   Ht '5\' 1"'  (1.549 m)   Wt 144 lb 4 oz (65.4 kg)   BMI 27.26 kg/m      Assessment & Plan:  MAYSOON LOZADA comes in today with chief complaint of Medical Management of Chronic Issues   Diagnosis and orders addressed:  1. Primary hypertension - CMP14+EGFR - CBC with Differential/Platelet  2. Aortic atherosclerosis (HCC) - CMP14+EGFR - CBC with Differential/Platelet  3. Osteoarthritis of multiple joints, unspecified osteoarthritis type - CMP14+EGFR - CBC with Differential/Platelet  4. Overweight (BMI 25.0-29.9) - CMP14+EGFR - CBC with Differential/Platelet  5. Insomnia, unspecified type - CMP14+EGFR - CBC with Differential/Platelet  6. Chronic bilateral low back pain, unspecified whether sciatica present - CMP14+EGFR - CBC with Differential/Platelet  7. History of TIA (transient ischemic attack)  - CMP14+EGFR - CBC with Differential/Platelet  8. Hyperlipidemia, unspecified hyperlipidemia type - CMP14+EGFR - CBC with Differential/Platelet  9. Vitamin D deficiency - CMP14+EGFR - CBC with Differential/Platelet  10. GAD (generalized anxiety disorder) - CMP14+EGFR - CBC with Differential/Platelet   Labs pending Health Maintenance reviewed Diet and exercise encouraged  Follow up plan: 3 month    Evelina Dun, FNP

## 2020-11-22 NOTE — Patient Instructions (Signed)
Osteoarthritis Osteoarthritis is a type of arthritis. It refers to joint pain or joint disease. Osteoarthritis affects tissue that covers the ends of bones in joints (cartilage). Cartilage acts as a cushion between the bones and helps them move smoothly. Osteoarthritis occurs when cartilage in the joints gets worn down. Osteoarthritis is sometimes called "wear and tear" arthritis. Osteoarthritis is the most common form of arthritis. It often occurs in older people. It is a condition that gets worse over time. The joints most often affected by this condition are in the fingers, toes, hips, knees, and spine, including the neck and lower back. What are the causes? This condition is caused by the wearing down of cartilage that covers the ends of bones. What increases the risk? The following factors may make you more likely to develop this condition: Being age 50 or older. Obesity. Overuse of joints. Past injury of a joint. Past surgery on a joint. Family history of osteoarthritis. What are the signs or symptoms? The main symptoms of this condition are pain, swelling, and stiffness in the joint. Other symptoms may include: An enlarged joint. More pain and further damage caused by small pieces of bone or cartilage that break off and float inside of the joint. Small deposits of bone (osteophytes) that grow on the edges of the joint. A grating or scraping feeling inside the joint when you move it. Popping or creaking sounds when you move. Difficulty walking or exercising. An inability to grip items, twist your hand(s), or control the movements of your hands and fingers. How is this diagnosed? This condition may be diagnosed based on: Your medical history. A physical exam. Your symptoms. X-rays of the affected joint(s). Blood tests to rule out other types of arthritis. How is this treated? There is no cure for this condition, but treatment can help control pain and improve joint function.  Treatment may include a combination of therapies, such as: Pain relief techniques, such as: Applying heat and cold to the joint. Massage. A form of talk therapy called cognitive behavioral therapy (CBT). This therapy helps you set goals and follow up on the changes that you make. Medicines for pain and inflammation. The medicines can be taken by mouth or applied to the skin. They include: NSAIDs, such as ibuprofen. Prescription medicines. Strong anti-inflammatory medicines (corticosteroids). Certain nutritional supplements. A prescribed exercise program. You may work with a physical therapist. Assistive devices, such as a brace, wrap, splint, specialized glove, or cane. A weight control plan. Surgery, such as: An osteotomy. This is done to reposition the bones and relieve pain or to remove loose pieces of bone and cartilage. Joint replacement surgery. You may need this surgery if you have advanced osteoarthritis. Follow these instructions at home: Activity Rest your affected joints as told by your health care provider. Exercise as told by your health care provider. He or she may recommend specific types of exercise, such as: Strengthening exercises. These are done to strengthen the muscles that support joints affected by arthritis. Aerobic activities. These are exercises, such as brisk walking or water aerobics, that increase your heart rate. Range-of-motion activities. These help your joints move more easily. Balance and agility exercises. Managing pain, stiffness, and swelling   If directed, apply heat to the affected area as often as told by your health care provider. Use the heat source that your health care provider recommends, such as a moist heat pack or a heating pad. If you have a removable assistive device, remove it as told by   your health care provider. Place a towel between your skin and the heat source. If your health care provider tells you to keep the assistive device on  while you apply heat, place a towel between the assistive device and the heat source. Leave the heat on for 20-30 minutes. Remove the heat if your skin turns bright red. This is especially important if you are unable to feel pain, heat, or cold. You may have a greater risk of getting burned. If directed, put ice on the affected area. To do this: If you have a removable assistive device, remove it as told by your health care provider. Put ice in a plastic bag. Place a towel between your skin and the bag. If your health care provider tells you to keep the assistive device on during icing, place a towel between the assistive device and the bag. Leave the ice on for 20 minutes, 2-3 times a day. Move your fingers or toes often to reduce stiffness and swelling. Raise (elevate) the injured area above the level of your heart while you are sitting or lying down. General instructions Take over-the-counter and prescription medicines only as told by your health care provider. Maintain a healthy weight. Follow instructions from your health care provider for weight control. Do not use any products that contain nicotine or tobacco, such as cigarettes, e-cigarettes, and chewing tobacco. If you need help quitting, ask your health care provider. Use assistive devices as told by your health care provider. Keep all follow-up visits as told by your health care provider. This is important. Where to find more information National Institute of Arthritis and Musculoskeletal and Skin Diseases: www.niams.nih.gov National Institute on Aging: www.nia.nih.gov American College of Rheumatology: www.rheumatology.org Contact a health care provider if: You have redness, swelling, or a feeling of warmth in a joint that gets worse. You have a fever along with joint or muscle aches. You develop a rash. You have trouble doing your normal activities. Get help right away if: You have pain that gets worse and is not relieved by  pain medicine. Summary Osteoarthritis is a type of arthritis that affects tissue covering the ends of bones in joints (cartilage). This condition is caused by the wearing down of cartilage that covers the ends of bones. The main symptom of this condition is pain, swelling, and stiffness in the joint. There is no cure for this condition, but treatment can help control pain and improve joint function. This information is not intended to replace advice given to you by your health care provider. Make sure you discuss any questions you have with your health care provider. Document Revised: 05/16/2019 Document Reviewed: 05/16/2019 Elsevier Patient Education  2022 [employer]  

## 2021-01-05 ENCOUNTER — Other Ambulatory Visit: Payer: Self-pay | Admitting: Family

## 2021-01-05 DIAGNOSIS — M8580 Other specified disorders of bone density and structure, unspecified site: Secondary | ICD-10-CM

## 2021-01-05 DIAGNOSIS — I10 Essential (primary) hypertension: Secondary | ICD-10-CM

## 2021-02-25 ENCOUNTER — Ambulatory Visit (INDEPENDENT_AMBULATORY_CARE_PROVIDER_SITE_OTHER): Payer: Medicare Other

## 2021-02-25 ENCOUNTER — Ambulatory Visit (INDEPENDENT_AMBULATORY_CARE_PROVIDER_SITE_OTHER): Payer: Medicare Other | Admitting: Family

## 2021-02-25 ENCOUNTER — Encounter: Payer: Self-pay | Admitting: Family

## 2021-02-25 ENCOUNTER — Other Ambulatory Visit: Payer: Self-pay

## 2021-02-25 VITALS — BP 128/75 | HR 62 | Temp 98.1°F | Ht 61.0 in | Wt 150.6 lb

## 2021-02-25 DIAGNOSIS — Z8673 Personal history of transient ischemic attack (TIA), and cerebral infarction without residual deficits: Secondary | ICD-10-CM | POA: Diagnosis not present

## 2021-02-25 DIAGNOSIS — E559 Vitamin D deficiency, unspecified: Secondary | ICD-10-CM | POA: Diagnosis not present

## 2021-02-25 DIAGNOSIS — I10 Essential (primary) hypertension: Secondary | ICD-10-CM

## 2021-02-25 DIAGNOSIS — I700 Atherosclerosis of aorta: Secondary | ICD-10-CM

## 2021-02-25 DIAGNOSIS — M545 Low back pain, unspecified: Secondary | ICD-10-CM | POA: Diagnosis not present

## 2021-02-25 DIAGNOSIS — F411 Generalized anxiety disorder: Secondary | ICD-10-CM

## 2021-02-25 DIAGNOSIS — G4700 Insomnia, unspecified: Secondary | ICD-10-CM | POA: Diagnosis not present

## 2021-02-25 DIAGNOSIS — M8589 Other specified disorders of bone density and structure, multiple sites: Secondary | ICD-10-CM | POA: Diagnosis not present

## 2021-02-25 DIAGNOSIS — M159 Polyosteoarthritis, unspecified: Secondary | ICD-10-CM

## 2021-02-25 DIAGNOSIS — E663 Overweight: Secondary | ICD-10-CM

## 2021-02-25 DIAGNOSIS — M8580 Other specified disorders of bone density and structure, unspecified site: Secondary | ICD-10-CM | POA: Diagnosis not present

## 2021-02-25 DIAGNOSIS — Z780 Asymptomatic menopausal state: Secondary | ICD-10-CM | POA: Diagnosis not present

## 2021-02-25 DIAGNOSIS — G8929 Other chronic pain: Secondary | ICD-10-CM | POA: Diagnosis not present

## 2021-02-25 DIAGNOSIS — E785 Hyperlipidemia, unspecified: Secondary | ICD-10-CM

## 2021-02-25 DIAGNOSIS — M1611 Unilateral primary osteoarthritis, right hip: Secondary | ICD-10-CM | POA: Diagnosis not present

## 2021-02-25 MED ORDER — TRAMADOL HCL 50 MG PO TABS: 50.0000 mg | ORAL_TABLET | Freq: Two times a day (BID) | ORAL | 2 refills | Status: DC | PRN

## 2021-02-25 NOTE — Progress Notes (Signed)
Subjective:    Patient ID: Andrea Moreno, female    DOB: [birth year], 80 y.o.   MRN: [id number]  Chief Complaint  Patient presents with   Medical Management of Chronic Issues   Pt presents to the office today for chronic follow up. She reports her wife died in Aug 05, 2019.  She reports she is doing much better today.   She reports she is having mild balance issues. She states she is getting dizzy when she reaches above her head.   She is followed by Ortho for osteoarthritis.  She has aortic atherosclerosis and tries low fat diet. She takes niacin. She can not tolerated statins because of muscle pain.  Hypertension This is a chronic problem. The current episode started more than 1 year ago. The problem has been resolved since onset. The problem is controlled. Associated symptoms include anxiety. Pertinent negatives include no malaise/fatigue, peripheral edema or shortness of breath. Risk factors for coronary artery disease include dyslipidemia and obesity. The current treatment provides moderate improvement. There is no history of heart failure.  Arthritis Presents for follow-up visit. She complains of pain and stiffness. The symptoms have been stable. Affected locations include the left hip and right hip (back). Her pain is at a severity of 3/10.  Insomnia Primary symptoms: difficulty falling asleep, frequent awakening, no malaise/fatigue.   The current episode started more than one year. The onset quality is gradual. The problem occurs intermittently.  Hyperlipidemia This is a chronic problem. The current episode started more than 1 year ago. Exacerbating diseases include obesity. Pertinent negatives include no shortness of breath. Current antihyperlipidemic treatment includes statins. The current treatment provides moderate improvement of lipids. Risk factors for coronary artery disease include dyslipidemia, hypertension and a sedentary lifestyle.  Back Pain This is a chronic  problem. The current episode started more than 1 year ago. The problem occurs intermittently. The pain is present in the lumbar spine. The pain is at a severity of 3/10. The pain is moderate.  Anxiety Presents for follow-up visit. Symptoms include depressed mood, excessive worry, insomnia, irritability and nervous/anxious behavior. Patient reports no shortness of breath. Symptoms occur most days. The severity of symptoms is moderate.   Osteopenia Pt taking Evista daily. Her last Dexascan was 10/21/16.    Review of Systems  Constitutional:  Positive for irritability. Negative for malaise/fatigue.  Respiratory:  Negative for shortness of breath.   Musculoskeletal:  Positive for arthritis, back pain and stiffness.  Psychiatric/Behavioral:  The patient is nervous/anxious and has insomnia.   All other systems reviewed and are negative.     Objective:   Physical Exam Vitals reviewed.  Constitutional:      General: She is not in acute distress.    Appearance: She is well-developed.  HENT:     Head: Normocephalic and atraumatic.     Right Ear: Tympanic membrane normal.     Left Ear: Tympanic membrane normal.  Eyes:     Pupils: Pupils are equal, round, and reactive to light.  Neck:     Thyroid: No thyromegaly.  Cardiovascular:     Rate and Rhythm: Normal rate and regular rhythm.     Heart sounds: Normal heart sounds. No murmur heard. Pulmonary:     Effort: Pulmonary effort is normal. No respiratory distress.     Breath sounds: Normal breath sounds. No wheezing.  Abdominal:     General: Bowel sounds are normal. There is no distension.     Palpations: Abdomen is soft.  Tenderness: There is no abdominal tenderness.  Musculoskeletal:        General: No tenderness.     Cervical back: Normal range of motion and neck supple.     Comments: Pain in lumbar with flexion   Skin:    General: Skin is warm and dry.  Neurological:     Mental Status: She is alert and oriented to person,  place, and time.     Cranial Nerves: No cranial nerve deficit.     Motor: Weakness present.     Gait: Gait abnormal.     Deep Tendon Reflexes: Reflexes are normal and symmetric.  Psychiatric:        Behavior: Behavior normal.        Thought Content: Thought content normal.        Judgment: Judgment normal.      BP 128/75   Pulse 62   Temp 98.1 F (36.7 C) (Temporal)   Ht _0  (1.549 m)   Wt 150 lb 9.6 oz (68.3 kg)   BMI 28.46 kg/m      Assessment & Plan:  Andrea Moreno comes in today with chief complaint of Medical Management of Chronic Issues   Diagnosis and orders addressed:  1. Primary hypertension - CMP14+EGFR - CBC with Differential/Platelet  2. Aortic atherosclerosis (HCC) - CMP14+EGFR - CBC with Differential/Platelet - Lipid panel  3. Osteoarthritis of multiple joints, unspecified osteoarthritis type - CMP14+EGFR - CBC with Differential/Platelet  4. Overweight (BMI 25.0-29.9) - CMP14+EGFR - CBC with Differential/Platelet  5. Insomnia, unspecified type - CMP14+EGFR - CBC with Differential/Platelet  6. GAD (generalized anxiety disorder) - CMP14+EGFR - CBC with Differential/Platelet  7. Vitamin D deficiency - CMP14+EGFR - CBC with Differential/Platelet  8. Hyperlipidemia, unspecified hyperlipidemia type - CMP14+EGFR - CBC with Differential/Platelet - Lipid panel  9. History of TIA (transient ischemic attack)  - CMP14+EGFR - CBC with Differential/Platelet - Lipid panel  10. Chronic bilateral low back pain, unspecified whether sciatica present - traMADol (ULTRAM) 50 MG tablet; Take 1 tablet (50 mg total) by mouth every 12 (twelve) hours as needed.  Dispense: 60 tablet; Refill: 2 - CMP14+EGFR - CBC with Differential/Platelet  11. Primary osteoarthritis of right hip - traMADol (ULTRAM) 50 MG tablet; Take 1 tablet (50 mg total) by mouth every 12 (twelve) hours as needed.  Dispense: 60 tablet; Refill: 2 - CMP14+EGFR - CBC with  Differential/Platelet  12. Osteopenia, unspecified location - DG [facility] DEXA  13. Post-menopausal - DG [facility] DEXA   Labs pending Health Maintenance reviewed Diet and exercise encouraged  Follow up plan: 3 months   Evelina Dun, FNP

## 2021-02-25 NOTE — Patient Instructions (Addendum)
Insomnia Insomnia is a sleep disorder that makes it difficult to fall asleep or stay asleep. Insomnia can cause fatigue, low energy, difficulty concentrating, moodswings, and poor performance at work or school. There are three different ways to classify insomnia: Difficulty falling asleep. Difficulty staying asleep. Waking up too early in the morning. Any type of insomnia can be long-term (chronic) or short-term (acute). Both are common. Short-term insomnia usually lasts for three months or less. Chronic insomnia occurs at least three times a week for longer than threemonths. What are the causes? Insomnia may be caused by another condition, situation, or substance, such as: Anxiety. Certain medicines. Gastroesophageal reflux disease (GERD) or other gastrointestinal conditions. Asthma or other breathing conditions. Restless legs syndrome, sleep apnea, or other sleep disorders. Chronic pain. Menopause. Stroke. Abuse of alcohol, tobacco, or illegal drugs. Mental health conditions, such as depression. Caffeine. Neurological disorders, such as Alzheimer's disease. An overactive thyroid (hyperthyroidism). Sometimes, the cause of insomnia may not be known. What increases the risk? Risk factors for insomnia include: Gender. Women are affected more often than men. Age. Insomnia is more common as you get older. Stress. Lack of exercise. Irregular work schedule or working night shifts. Traveling between different time zones. Certain medical and mental health conditions. What are the signs or symptoms? If you have insomnia, the main symptom is having trouble falling asleep or having trouble staying asleep. This may lead to other symptoms, such as: Feeling fatigued or having low energy. Feeling nervous about going to sleep. Not feeling rested in the morning. Having trouble concentrating. Feeling irritable, anxious, or depressed. How is this diagnosed? This condition may be diagnosed based  on: Your symptoms and medical history. Your health care provider may ask about: Your sleep habits. Any medical conditions you have. Your mental health. A physical exam. How is this treated? Treatment for insomnia depends on the cause. Treatment may focus on treating an underlying condition that is causing insomnia. Treatment may also include: Medicines to help you sleep. Counseling or therapy. Lifestyle adjustments to help you sleep better. Follow these instructions at home: Eating and drinking  Limit or avoid alcohol, caffeinated beverages, and cigarettes, especially close to bedtime. These can disrupt your sleep. Do not eat a large meal or eat spicy foods right before bedtime. This can lead to digestive discomfort that can make it hard for you to sleep.  Sleep habits  Keep a sleep diary to help you and your health care provider figure out what could be causing your insomnia. Write down: When you sleep. When you wake up during the night. How well you sleep. How rested you feel the next day. Any side effects of medicines you are taking. What you eat and drink. Make your bedroom a dark, comfortable place where it is easy to fall asleep. Put up shades or blackout curtains to block light from outside. Use a white noise machine to block noise. Keep the temperature cool. Limit screen use before bedtime. This includes: Watching TV. Using your smartphone, tablet, or computer. Stick to a routine that includes going to bed and waking up at the same times every day and night. This can help you fall asleep faster. Consider making a quiet activity, such as reading, part of your nighttime routine. Try to avoid taking naps during the day so that you sleep better at night. Get out of bed if you are still awake after 15 minutes of trying to sleep. Keep the lights down, but try reading or doing a quiet   activity. When you feel sleepy, go back to bed.  General instructions Take over-the-counter  and prescription medicines only as told by your health care provider. Exercise regularly, as told by your health care provider. Avoid exercise starting several hours before bedtime. Use relaxation techniques to manage stress. Ask your health care provider to suggest some techniques that may work well for you. These may include: Breathing exercises. Routines to release muscle tension. Visualizing peaceful scenes. Make sure that you drive carefully. Avoid driving if you feel very sleepy. Keep all follow-up visits as told by your health care provider. This is important. Contact a health care provider if: You are tired throughout the day. You have trouble in your daily routine due to sleepiness. You continue to have sleep problems, or your sleep problems get worse. Get help right away if: You have serious thoughts about hurting yourself or someone else. If you ever feel like you may hurt yourself or others, or have thoughts about taking your own life, get help right away. You can go to your nearest emergency department or call: Your local emergency services (911 in the [place] A suicide crisis helpline, such as the National Suicide Prevention Lifeline at 1-800-273-8255. This is open 24 hours a day. Summary Insomnia is a sleep disorder that makes it difficult to fall asleep or stay asleep. Insomnia can be long-term (chronic) or short-term (acute). Treatment for insomnia depends on the cause. Treatment may focus on treating an underlying condition that is causing insomnia. Keep a sleep diary to help you and your health care provider figure out what could be causing your insomnia. This information is not intended to replace advice given to you by your health care provider. Make sure you discuss any questions you have with your healthcare provider. Document Revised: 03/29/2020 Document Reviewed: 03/29/2020 Elsevier Patient Education  2022 Else[employer] Inc[employer]  

## 2021-02-26 ENCOUNTER — Other Ambulatory Visit: Payer: Self-pay | Admitting: Family

## 2021-02-26 LAB — CBC WITH DIFFERENTIAL/PLATELET
Basophils Absolute: 0.1 10*3/uL (ref 0.0–0.2)
Basos: 1 %
EOS (ABSOLUTE): 0.6 10*3/uL — ABNORMAL HIGH (ref 0.0–0.4)
Eos: 11 %
Hematocrit: 33.3 % — ABNORMAL LOW (ref 34.0–46.6)
Hemoglobin: 11.4 g/dL (ref 11.1–15.9)
Immature Grans (Abs): 0 10*3/uL (ref 0.0–0.1)
Immature Granulocytes: 0 %
Lymphocytes Absolute: 1.2 10*3/uL (ref 0.7–3.1)
Lymphs: 24 %
MCH: 29.7 pg (ref 26.6–33.0)
MCHC: 34.2 g/dL (ref 31.5–35.7)
MCV: 87 fL (ref 79–97)
Monocytes Absolute: 0.4 10*3/uL (ref 0.1–0.9)
Monocytes: 9 %
Neutrophils Absolute: 2.8 10*3/uL (ref 1.4–7.0)
Neutrophils: 55 %
Platelets: 192 10*3/uL (ref 150–450)
RBC: 3.84 x10E6/uL (ref 3.77–5.28)
RDW: 11.9 % (ref 11.7–15.4)
WBC: 5.1 10*3/uL (ref 3.4–10.8)

## 2021-02-26 LAB — CMP14+EGFR
ALT: 13 IU/L (ref 0–32)
AST: 19 IU/L (ref 0–40)
Albumin/Globulin Ratio: 1.9 (ref 1.2–2.2)
Albumin: 3.9 g/dL (ref 3.7–4.7)
Alkaline Phosphatase: 41 IU/L — ABNORMAL LOW (ref 44–121)
BUN/Creatinine Ratio: 26 (ref 12–28)
BUN: 19 mg/dL (ref 8–27)
Bilirubin Total: 0.4 mg/dL (ref 0.0–1.2)
CO2: 21 mmol/L (ref 20–29)
Calcium: 8.8 mg/dL (ref 8.7–10.3)
Chloride: 105 mmol/L (ref 96–106)
Creatinine, Ser: 0.72 mg/dL (ref 0.57–1.00)
Globulin, Total: 2.1 g/dL (ref 1.5–4.5)
Glucose: 105 mg/dL — ABNORMAL HIGH (ref 70–99)
Potassium: 4.5 mmol/L (ref 3.5–5.2)
Sodium: 140 mmol/L (ref 134–144)
Total Protein: 6 g/dL (ref 6.0–8.5)
eGFR: 84 mL/min/{1.73_m2} (ref >59)

## 2021-02-26 LAB — LIPID PANEL
Chol/HDL Ratio: 2.9 ratio (ref 0.0–4.4)
Cholesterol, Total: 162 mg/dL (ref 100–199)
HDL: 56 mg/dL (ref >39)
LDL Chol Calc (NIH): 88 mg/dL (ref 0–99)
Triglycerides: 97 mg/dL (ref 0–149)
VLDL Cholesterol Cal: 18 mg/dL (ref 5–40)

## 2021-03-05 DIAGNOSIS — Z23 Encounter for immunization: Secondary | ICD-10-CM | POA: Diagnosis not present

## 2021-03-09 ENCOUNTER — Other Ambulatory Visit: Payer: Self-pay | Admitting: Family

## 2021-03-09 DIAGNOSIS — G4700 Insomnia, unspecified: Secondary | ICD-10-CM

## 2021-03-22 ENCOUNTER — Ambulatory Visit (INDEPENDENT_AMBULATORY_CARE_PROVIDER_SITE_OTHER): Payer: Medicare Other

## 2021-03-22 VITALS — Ht 61.0 in | Wt 150.0 lb

## 2021-03-22 DIAGNOSIS — Z0000 Encounter for general adult medical examination without abnormal findings: Secondary | ICD-10-CM

## 2021-03-22 NOTE — Patient Instructions (Signed)
Andrea Moreno , Thank you for taking time to come for your Medicare Wellness Visit. I appreciate your ongoing commitment to your health goals. Please review the following plan we discussed and let me know if I can assist you in the future.   Screening recommendations/referrals: Colonoscopy: No longer required Mammogram: No longer required Bone Density: Done 02/25/2021 - Repeat every 2 years Recommended yearly ophthalmology/optometry visit for glaucoma screening and checkup Recommended yearly dental visit for hygiene and checkup  Vaccinations: Influenza vaccine: Done 03/04/2021 - Repeat annually Pneumococcal vaccine: Done 10/15/2012 & 01/16/2015 Tdap vaccine: Done 08/10/2013 - Repeat in 10 years Shingles vaccine: Done 01/19/2017 & 03/23/2017   Covid-19:Done 06/04/2019, 07/04/2019, 03/28/2020, & 10/23/2020  Advanced directives: in chart  Conditions/risks identified: Aim for 30 minutes of exercise or brisk walking each day, drink 6-8 glasses of water and eat lots of fruits and vegetables.   Next appointment: Follow up in one year for your annual wellness visit    Preventive Care 65 Years and Older, Female Preventive care refers to lifestyle choices and visits with your health care provider that can promote health and wellness. What does preventive care include? A yearly physical exam. This is also called an annual well check. Dental exams once or twice a year. Routine eye exams. Ask your health care provider how often you should have your eyes checked. Personal lifestyle choices, including: Daily care of your teeth and gums. Regular physical activity. Eating a healthy diet. Avoiding tobacco and drug use. Limiting alcohol use. Practicing safe sex. Taking low-dose aspirin every day. Taking vitamin and mineral supplements as recommended by your health care provider. What happens during an annual well check? The services and screenings done by your health care provider during your annual well  check will depend on your age, overall health, lifestyle risk factors, and family history of disease. Counseling  Your health care provider may ask you questions about your: Alcohol use. Tobacco use. Drug use. Emotional well-being. Home and relationship well-being. Sexual activity. Eating habits. History of falls. Memory and ability to understand (cognition). Work and work Statistician. Reproductive health. Screening  You may have the following tests or measurements: Height, weight, and BMI. Blood pressure. Lipid and cholesterol levels. These may be checked every 5 years, or more frequently if you are over 65 years old. Skin check. Lung cancer screening. You may have this screening every year starting at age 73 if you have a 30-pack-year history of smoking and currently smoke or have quit within the past 15 years. Fecal occult blood test (FOBT) of the stool. You may have this test every year starting at age 34. Flexible sigmoidoscopy or colonoscopy. You may have a sigmoidoscopy every 5 years or a colonoscopy every 10 years starting at age 77. Hepatitis C blood test. Hepatitis B blood test. Sexually transmitted disease (STD) testing. Diabetes screening. This is done by checking your blood sugar (glucose) after you have not eaten for a while (fasting). You may have this done every 1-3 years. Bone density scan. This is done to screen for osteoporosis. You may have this done starting at age 29. Mammogram. This may be done every 1-2 years. Talk to your health care provider about how often you should have regular mammograms. Talk with your health care provider about your test results, treatment options, and if necessary, the need for more tests. Vaccines  Your health care provider may recommend certain vaccines, such as: Influenza vaccine. This is recommended every year. Tetanus, diphtheria, and acellular pertussis (Tdap,  Td) vaccine. You may need a Td booster every 10 years. Zoster  vaccine. You may need this after age 69. Pneumococcal 13-valent conjugate (PCV13) vaccine. One dose is recommended after age 52. Pneumococcal polysaccharide (PPSV23) vaccine. One dose is recommended after age 55. Talk to your health care provider about which screenings and vaccines you need and how often you need them. This information is not intended to replace advice given to you by your health care provider. Make sure you discuss any questions you have with your health care provider. Document Released: 06/15/2015 Document Revised: 02/06/2016 Document Reviewed: 03/20/2015 Elsevier Interactive Patient Education  2017 [place] Prevention in the Home Falls can cause injuries. They can happen to people of all ages. There are many things you can do to make your home safe and to help prevent falls. What can I do on the outside of my home? Regularly fix the edges of walkways and driveways and fix any cracks. Remove anything that might make you trip as you walk through a door, such as a raised step or threshold. Trim any bushes or trees on the path to your home. Use bright outdoor lighting. Clear any walking paths of anything that might make someone trip, such as rocks or tools. Regularly check to see if handrails are loose or broken. Make sure that both sides of any steps have handrails. Any raised decks and porches should have guardrails on the edges. Have any leaves, snow, or ice cleared regularly. Use sand or salt on walking paths during winter. Clean up any spills in your garage right away. This includes oil or grease spills. What can I do in the bathroom? Use night lights. Install grab bars by the toilet and in the tub and shower. Do not use towel bars as grab bars. Use non-skid mats or decals in the tub or shower. If you need to sit down in the shower, use a plastic, non-slip stool. Keep the floor dry. Clean up any water that spills on the floor as soon as it happens. Remove  soap buildup in the tub or shower regularly. Attach bath mats securely with double-sided non-slip rug tape. Do not have throw rugs and other things on the floor that can make you trip. What can I do in the bedroom? Use night lights. Make sure that you have a light by your bed that is easy to reach. Do not use any sheets or blankets that are too big for your bed. They should not hang down onto the floor. Have a firm chair that has side arms. You can use this for support while you get dressed. Do not have throw rugs and other things on the floor that can make you trip. What can I do in the kitchen? Clean up any spills right away. Avoid walking on wet floors. Keep items that you use a lot in easy-to-reach places. If you need to reach something above you, use a strong step stool that has a grab bar. Keep electrical cords out of the way. Do not use floor polish or wax that makes floors slippery. If you must use wax, use non-skid floor wax. Do not have throw rugs and other things on the floor that can make you trip. What can I do with my stairs? Do not leave any items on the stairs. Make sure that there are handrails on both sides of the stairs and use them. Fix handrails that are broken or loose. Make sure that handrails are as  long as the stairways. Check any carpeting to make sure that it is firmly attached to the stairs. Fix any carpet that is loose or worn. Avoid having throw rugs at the top or bottom of the stairs. If you do have throw rugs, attach them to the floor with carpet tape. Make sure that you have a light switch at the top of the stairs and the bottom of the stairs. If you do not have them, ask someone to add them for you. What else can I do to help prevent falls? Wear shoes that: Do not have high heels. Have rubber bottoms. Are comfortable and fit you well. Are closed at the toe. Do not wear sandals. If you use a stepladder: Make sure that it is fully opened. Do not climb a  closed stepladder. Make sure that both sides of the stepladder are locked into place. Ask someone to hold it for you, if possible. Clearly mark and make sure that you can see: Any grab bars or handrails. First and last steps. Where the edge of each step is. Use tools that help you move around (mobility aids) if they are needed. These include: Canes. Walkers. Scooters. Crutches. Turn on the lights when you go into a dark area. Replace any light bulbs as soon as they burn out. Set up your furniture so you have a clear path. Avoid moving your furniture around. If any of your floors are uneven, fix them. If there are any pets around you, be aware of where they are. Review your medicines with your doctor. Some medicines can make you feel dizzy. This can increase your chance of falling. Ask your doctor what other things that you can do to help prevent falls. This information is not intended to replace advice given to you by your health care provider. Make sure you discuss any questions you have with your health care provider. Document Released: 03/15/2009 Document Revised: 10/25/2015 Document Reviewed: 06/23/2014 Elsevier Interactive Patient Education  2017 [employer].

## 2021-03-22 NOTE — Progress Notes (Signed)
Subjective:   Andrea Moreno is a 80 y.o. female who presents for Medicare Annual (Subsequent) preventive examination.  Virtual Visit via Telephone Note  I connected with  DENITA LUN on 03/22/21 at  8:30 AM EDT by telephone and verified that I am speaking with the correct person using two identifiers.  Location: Patient: Home Provider: [facility] Persons participating in the virtual visit: patient/Nurse Health Advisor   I discussed the limitations, risks, security and privacy concerns of performing an evaluation and management service by telephone and the availability of in person appointments. The patient expressed understanding and agreed to proceed.  Interactive audio and video telecommunications were attempted between this nurse and patient, however failed, due to patient having technical difficulties OR patient did not have access to video capability.  We continued and completed visit with audio only.  Some vital signs may be absent or patient reported.   Breahna Boylen E Ndeye Tenorio, LPN   Review of Systems     Cardiac Risk Factors include: advanced age (>79mn, >>31women);family history of premature cardiovascular disease;hypertension;dyslipidemia;Other (see comment), Risk factor comments: hx of TIA, atherosclerosis     Objective:    Today's Vitals   03/22/21 0828 03/22/21 0829  Weight: 150 lb (68 kg)   Height: '5\' 1"'  (1.549 m)   PainSc:  2    Body mass index is 28.34 kg/m.  Advanced Directives 03/22/2021 10/22/2017 02/26/2016 02/15/2016 09/20/2014 10/14/2012  Does Patient Have a Medical Advance Directive? Yes Yes Yes Yes Yes Patient has advance directive, copy not in chart  Type of Advance Directive [place]Living will [place]Living will [place]Living will Healthcare Power of [place] will  Does patient want to make changes to medical advance directive? -  No - Patient declined No - Patient declined - No - Patient declined -  Copy of [place] Chart? Yes - validated most recent copy scanned in chart (See row information) Yes Yes No - copy requested No - copy requested -    Current Medications (verified) Outpatient Encounter Medications as of 03/22/2021  Medication Sig   acetaminophen (TYLENOL) 650 MG CR tablet Take 650 mg by mouth at bedtime.   aspirin 325 MG EC tablet Take 325 mg by mouth daily.   Calcium Citrate (CITRACAL PO) Take 600 mg by mouth 2 (two) times daily.   Collagen-Vitamin C-Biotin (COLLAGEN 1500/C PO) Take by mouth.   doxepin (SINEQUAN) 25 MG capsule TAKE 1 CAPSULE AT BEDTIME   losartan (COZAAR) 100 MG tablet TAKE 1 TABLET DAILY   Multiple Vitamin (MULTIVITAMIN) capsule Take 1 capsule by mouth daily.   niacin 500 MG tablet Take 500 mg by mouth at bedtime.   nystatin (MYCOSTATIN/NYSTOP) powder Apply 1 application topically 3 (three) times daily.   nystatin ointment (MYCOSTATIN) Apply 1 application topically 2 (two) times daily.   polyethylene glycol powder (GLYCOLAX/MIRALAX) 17 GM/SCOOP powder Take 17 g by mouth 2 (two) times daily as needed.   Potassium Gluconate 550 (90 K) MG TABS Take by mouth.   raloxifene (EVISTA) 60 MG tablet TAKE 1 TABLET DAILY   traMADol (ULTRAM) 50 MG tablet Take 1 tablet (50 mg total) by mouth every 12 (twelve) hours as needed.   No facility-administered encounter medications on file as of 03/22/2021.    Allergies (verified) Celebrex [celecoxib], Demerol [meperidine], Lisinopril, Statins, and Zyrtec [cetirizine]   History: Past Medical History:  Diagnosis Date  Allergy    Arthritis    "back, right shoulder, hips" (10/14/2012)   Cataract    Complication of anesthesia    "1950's had appendix out; kicked the RN; no problems since" (10/14/2012)   Heart murmur    normal Echo 2014 per pt   History of migraine    1960s   Hyperlipidemia    Hypertension    Osteopenia     Pneumonia    "couple of times; last time in the mid 1990's" (10/14/2012)   TIA (transient ischemic attack) 10/14/2012   Past Surgical History:  Procedure Laterality Date   APPENDECTOMY  1950's   BACK SURGERY     COLONOSCOPY     DILATION AND CURETTAGE OF UTERUS  ~ [place]   "had a disc removed" (10/14/2012)   TONSILLECTOMY  1940's   TOTAL HIP ARTHROPLASTY Right 02/26/2016   Procedure: TOTAL HIP ARTHROPLASTY ANTERIOR APPROACH;  Surgeon: Renette Butters, MD;  Location: [place];  Service: Orthopedics;  Laterality: Right;   TUBAL LIGATION  62's   Family History  Problem Relation Age of Onset   Heart disease Mother    Diabetes Mother    Arthritis Mother    Vascular Disease Mother    Diabetes Sister    Hypertension Sister    Arthritis Sister    Early death Brother    Vascular Disease Brother    Heart attack Brother    Arthritis Father    Heart attack Brother    Diabetes Sister    Hypertension Sister    Arthritis Sister    Social History   Socioeconomic History   Marital status: Widowed    Spouse name: Not on file   Number of children: 0   Years of education: 16   Highest education level: Bachelor's degree (e.g., BA, AB, BS)  Occupational History   Occupation: Retired    Comment: occupational therapy  Tobacco Use   Smoking status: Former    Packs/day: 0.30    Years: 5.00    Pack years: 1.50    Types: Cigarettes    Quit date: 08/31/1985    Years since quitting: 35.5   Smokeless tobacco: Never  Vaping Use   Vaping Use: Never used  Substance and Sexual Activity   Alcohol use: Not Currently    Alcohol/week: 1.0 standard drink    Types: 1 Cans of beer per week    Comment: 10/14/2012 "~ 6 oz wine/day", 3 times a month (9/17)   Drug use: No   Sexual activity: Not Currently  Other Topics Concern   Not on file  Social History Narrative   Lives alone   Tai Chi once per week   Uses walker - high fall risk - several falls   Had Life Alert bracelet  and didn't like it - got rid of it- has cell phone in pocket at all times with close friends on speed dial   Social Determinants of Health   Financial Resource Strain: Low Risk    Difficulty of Paying Living Expenses: Not hard at all  Food Insecurity: No Food Insecurity   Worried About Charity fundraiser in the Last Year: Never true   [place] in the Last Year: Never true  Transportation Needs: No Transportation Needs   Lack of Transportation (Medical): No   Lack of Transportation (Non-Medical): No  Physical Activity: Insufficiently Active   Days of Exercise per Week: 7 days   Minutes  of Exercise per Session: 20 min  Stress: No Stress Concern Present   Feeling of Stress : Only a little  Social Connections: Moderately Isolated   Frequency of Communication with Friends and Family: More than three times a week   Frequency of Social Gatherings with Friends and Family: More than three times a week   Attends Religious Services: Never   Marine scientist or Organizations: Yes   Attends Music therapist: 1 to 4 times per year   Marital Status: Divorced    Tobacco Counseling Counseling given: Not Answered   Clinical Intake:  Pre-visit preparation completed: Yes  Pain : 0-10 Pain Score: 2  Pain Type: Chronic pain Pain Location: Back Pain Descriptors / Indicators: Aching, Discomfort Pain Onset: More than a month ago Pain Frequency: Intermittent     BMI - recorded: 28.34 Nutritional Status: BMI 25 -29 Overweight Nutritional Risks: None Diabetes: No  How often do you need to have someone help you when you read instructions, pamphlets, or other written materials from your doctor or pharmacy?: 1 - Never  Diabetic? No  Interpreter Needed?: No  Information entered by :: Jasalyn Frysinger, LPN   Activities of Daily Living In your present state of health, do you have any difficulty performing the following activities: 03/22/2021  Hearing? Y  Vision? N   Difficulty concentrating or making decisions? N  Walking or climbing stairs? N  Dressing or bathing? N  Doing errands, shopping? N  Preparing Food and eating ? N  Using the Toilet? N  In the past six months, have you accidently leaked urine? Y  Do you have problems with loss of bowel control? N  Managing your Medications? N  Managing your Finances? N  Housekeeping or managing your Housekeeping? N  Some recent data might be hidden    Patient Care Team: Sharion Balloon, FNP as PCP - General (Nurse Practitioner) Almedia Balls, MD as Consulting Physician (Orthopedic Surgery) Celestia Khat, OD (Optometry)  Indicate any recent Medical Services you may have received from other than [facility] providers in the past year (date may be approximate).     Assessment:   This is a routine wellness examination for [place].  Hearing/Vision screen Hearing Screening - Comments:: Wears hearing aids - from [facility] in [place] - Comments:: Wears rx glasses - up to date with annual eye exams with [facility]  Dietary issues and exercise activities discussed: Current Exercise Habits: Home exercise routine, Type of exercise: walking;stretching;Other - see comments (Tai Chi once per week - walking, stretching daily), Time (Minutes): 20, Frequency (Times/Week): 7, Weekly Exercise (Minutes/Week): 140, Intensity: Mild, Exercise limited by: orthopedic condition(s)   Goals Addressed             This Visit's Progress    Increase physical activity   On track    Continue to stay physically active for at least 30 min daily.       Depression Screen PHQ 2/9 Scores 03/22/2021 11/22/2020 10/16/2020 08/20/2020 05/22/2020 02/21/2020 11/18/2019  PHQ - 2 Score 0 0 0 0 0 0 0  PHQ- 9 Score - 0 0 - - - -    Fall Risk Fall Risk  03/22/2021 11/22/2020 10/16/2020 05/22/2020 02/21/2020  Falls in the past year? '1 1 1 ' 0 0  Number falls in past yr: '1 1 1 ' - -  Comment - - - - -  Injury with Fall? 1  0 1 - -  Risk for fall due to :  History of fall(s);Impaired balance/gait;Medication side effect;Orthopedic patient History of fall(s) - - -  Follow up Education provided;Falls prevention discussed Falls evaluation completed - - -    FALL RISK PREVENTION PERTAINING TO THE HOME:  Any stairs in or around the home? Yes  If so, are there any without handrails? No  Home free of loose throw rugs in walkways, pet beds, electrical cords, etc? Yes  Adequate lighting in your home to reduce risk of falls? Yes   ASSISTIVE DEVICES UTILIZED TO PREVENT FALLS:  Life alert? No  Use of a cane, walker or w/c? Yes  Grab bars in the bathroom? No  Shower chair or bench in shower? Yes  Elevated toilet seat or a handicapped toilet? Yes   TIMED UP AND GO:  Was the test performed? No . Telephonic visit  Cognitive Function: Normal cognitive status assessed by direct observation by this Nurse Health Advisor. No abnormalities found.    MMSE - Mini Mental State Exam 10/22/2017 10/21/2016 09/20/2014  Orientation to time '5 5 5  ' Orientation to Place '5 5 5  ' Registration '3 3 3  ' Attention/ Calculation '5 3 5  ' Recall '3 3 2  ' Language- name 2 objects '2 2 2  ' Language- repeat '1 1 1  ' Language- follow 3 step command '3 3 3  ' Language- read & follow direction '1 1 1  ' Write a sentence '1 1 1  ' Copy design '1 1 1  ' Total score '30 28 29        ' Immunizations Immunization History  Administered Date(s) Administered   Influenza, High Dose Seasonal PF 03/09/2013, 02/15/2014, 03/20/2015, 03/30/2018   Influenza,inj,quad, With Preservative 02/25/2019   Influenza-Unspecified 03/07/2019, 04/02/2020, 03/04/2021   Moderna Sars-Covid-2 Vaccination 06/04/2019, 07/04/2019, 03/28/2020, 10/23/2020   Pneumococcal Conjugate-13 01/16/2015   Pneumococcal Polysaccharide-23 10/15/2012   Tdap 08/10/2013   Zoster Recombinat (Shingrix) 01/19/2017, 03/23/2017   Zoster, Live 06/02/2004    TDAP status: Up to date  Flu Vaccine status: Up to  date  Pneumococcal vaccine status: Up to date  Covid-19 vaccine status: Completed vaccines  Qualifies for Shingles Vaccine? Yes   Zostavax completed Yes   Shingrix Completed?: Yes  Screening Tests Health Maintenance  Topic Date Due   COVID-19 Vaccine (5 - Booster for Moderna series) 12/18/2020   TETANUS/TDAP  08/11/2023   Pneumonia Vaccine 73+ Years old  Completed   INFLUENZA VACCINE  Completed   DEXA SCAN  Completed   Zoster Vaccines- Shingrix  Completed   HPV VACCINES  Aged Out    Health Maintenance  Health Maintenance Due  Topic Date Due   COVID-19 Vaccine (5 - Booster for Moderna series) 12/18/2020    Colorectal cancer screening: No longer required.   Mammogram status: No longer required due to age.  Bone Density status: Completed 02/25/2021. Results reflect: Bone density results: OSTEOPENIA. Repeat every 2 years.  Lung Cancer Screening: (Low Dose CT Chest recommended if Age 51-80 years, 30 pack-year currently smoking OR have quit w/in 15years.) does not qualify.   Additional Screening:  Hepatitis C Screening: does not qualify  Vision Screening: Recommended annual ophthalmology exams for early detection of glaucoma and other disorders of the eye. Is the patient up to date with their annual eye exam?  Yes  Who is the provider or what is the name of the office in which the patient attends annual eye exams? [place] If pt is not established with a provider, would they like to be referred to a provider to establish care? No .  Dental Screening: Recommended annual dental exams for proper oral hygiene  Community Resource Referral / Chronic Care Management: CRR required this visit?  No   CCM required this visit?  No      Plan:     I have personally reviewed and noted the following in the patient's chart:   Medical and social history Use of alcohol, tobacco or illicit drugs  Current medications and supplements including opioid prescriptions.   Functional ability and status Nutritional status Physical activity Advanced directives List of other physicians Hospitalizations, surgeries, and ER visits in previous 12 months Vitals Screenings to include cognitive, depression, and falls Referrals and appointments  In addition, I have reviewed and discussed with patient certain preventive protocols, quality metrics, and best practice recommendations. A written personalized care plan for preventive services as well as general preventive health recommendations were provided to patient.     Sandrea Hammond, LPN   [id number]   Nurse Notes: She wanted me to make it known that she is upset because every time she comes to the doctor's office, she has to fill out a form asking about falls, stress, etc, but then her falls and anxiety are not addressed at the visit - she thinks this is pointless and won't fill it out anymore if providers don't look at it and address the issues.

## 2021-04-02 DIAGNOSIS — Z23 Encounter for immunization: Secondary | ICD-10-CM | POA: Diagnosis not present

## 2021-04-06 ENCOUNTER — Other Ambulatory Visit: Payer: Self-pay | Admitting: Family

## 2021-04-06 DIAGNOSIS — I10 Essential (primary) hypertension: Secondary | ICD-10-CM

## 2021-04-06 DIAGNOSIS — M8580 Other specified disorders of bone density and structure, unspecified site: Secondary | ICD-10-CM

## 2021-04-16 ENCOUNTER — Encounter: Payer: Self-pay | Admitting: Family

## 2021-04-16 ENCOUNTER — Other Ambulatory Visit: Payer: Self-pay

## 2021-04-16 ENCOUNTER — Ambulatory Visit (INDEPENDENT_AMBULATORY_CARE_PROVIDER_SITE_OTHER): Payer: Medicare Other | Admitting: Family

## 2021-04-16 VITALS — BP 130/77 | HR 98 | Temp 97.6°F | Ht 61.0 in | Wt 143.0 lb

## 2021-04-16 DIAGNOSIS — G8929 Other chronic pain: Secondary | ICD-10-CM

## 2021-04-16 DIAGNOSIS — R399 Unspecified symptoms and signs involving the genitourinary system: Secondary | ICD-10-CM | POA: Diagnosis not present

## 2021-04-16 DIAGNOSIS — M545 Low back pain, unspecified: Secondary | ICD-10-CM

## 2021-04-16 LAB — URINALYSIS, COMPLETE
Bilirubin, UA: NEGATIVE
Glucose, UA: NEGATIVE
Ketones, UA: NEGATIVE
Leukocytes,UA: NEGATIVE
Nitrite, UA: NEGATIVE
RBC, UA: NEGATIVE
Specific Gravity, UA: 1.015 (ref 1.005–1.030)
Urobilinogen, Ur: 0.2 mg/dL (ref 0.2–1.0)
pH, UA: 7.5 (ref 5.0–7.5)

## 2021-04-16 LAB — MICROSCOPIC EXAMINATION
Epithelial Cells (non renal): NONE SEEN /hpf (ref 0–10)
Mucus, UA: NEGATIVE
RBC, Urine: NONE SEEN /hpf (ref 0–2)
Renal Epithel, UA: NONE SEEN /hpf
WBC, UA: NONE SEEN /hpf (ref 0–5)

## 2021-04-16 NOTE — Progress Notes (Signed)
   Subjective:    Patient ID: Andrea Moreno, female    DOB: 06-29-1940, 80 y.o.   MRN: [id number]  Chief Complaint  Patient presents with   Back Pain   PT presents to the office today with "different" lower back pain that started Sunday evening. She reports the pain comes and goes. Back Pain  Urinary Frequency  This is a new problem. The current episode started 1 to 4 weeks ago. The problem occurs intermittently. The problem has been waxing and waning. The patient is experiencing no pain. Associated symptoms include flank pain, frequency, hesitancy and urgency. Pertinent negatives include no hematuria, nausea or vomiting. Associated symptoms comments: Decrease appetite . She has tried increased fluids for the symptoms. The treatment provided no relief.     Review of Systems  Gastrointestinal:  Negative for nausea and vomiting.  Genitourinary:  Positive for flank pain, frequency, hesitancy and urgency. Negative for hematuria.  Musculoskeletal:  Positive for back pain.  All other systems reviewed and are negative.     Objective:   Physical Exam Vitals reviewed.  Constitutional:      General: She is not in acute distress.    Appearance: She is well-developed.  HENT:     Head: Normocephalic and atraumatic.  Eyes:     Pupils: Pupils are equal, round, and reactive to light.  Neck:     Thyroid: No thyromegaly.  Cardiovascular:     Rate and Rhythm: Normal rate and regular rhythm.     Heart sounds: Normal heart sounds. No murmur heard. Pulmonary:     Effort: Pulmonary effort is normal. No respiratory distress.     Breath sounds: Normal breath sounds. No wheezing.  Abdominal:     General: Bowel sounds are normal. There is no distension.     Palpations: Abdomen is soft.     Tenderness: There is abdominal tenderness (mild lower abdomen).  Musculoskeletal:        General: No tenderness.     Cervical back: Normal range of motion and neck supple.     Comments: Pain in lower back  pain, generalized weakness in back  Skin:    General: Skin is warm and dry.  Neurological:     Mental Status: She is alert and oriented to person, place, and time.     Cranial Nerves: No cranial nerve deficit.     Deep Tendon Reflexes: Reflexes are normal and symmetric.  Psychiatric:        Behavior: Behavior normal.        Thought Content: Thought content normal.        Judgment: Judgment normal.    BP 130/77   Pulse 98   Temp 97.6 F (36.4 C) (Temporal)   Ht 5\' 1"  (1.549 m)   Wt 143 lb (64.9 kg)   BMI 27.02 kg/m    Assessment & Plan:  Andrea Moreno comes in today with chief complaint of Back Pain   Diagnosis and orders addressed:  1. UTI symptoms -Urine negative Force fluids Urine culture pending  - Urinalysis, Complete - Urine Culture  2. Chronic bilateral low back pain, unspecified whether sciatica present Rest Continue Ultram ROM exercises  RTO if symptoms worsen or do not improve    Evelina Dun, FNP

## 2021-04-16 NOTE — Patient Instructions (Signed)

## 2021-04-20 LAB — URINE CULTURE

## 2021-04-24 ENCOUNTER — Other Ambulatory Visit: Payer: Self-pay | Admitting: Family Medicine

## 2021-04-24 MED ORDER — NABUMETONE 500 MG PO TABS: 500.0000 mg | ORAL_TABLET | Freq: Two times a day (BID) | ORAL | 1 refills | Status: DC

## 2021-04-24 MED ORDER — LINACLOTIDE 145 MCG PO CAPS: 145.0000 ug | ORAL_CAPSULE | Freq: Every day | ORAL | 5 refills | Status: DC

## 2021-04-24 NOTE — Telephone Encounter (Signed)
I sent Linzess 74 1 to help with her constipation.  I sent in nabumetone to help with her pain.  The Linzess will be cheaper if we send it to her mail order unfortunately that will not help with her immediate problem.  In the future we can send it there for her.

## 2021-05-06 ENCOUNTER — Ambulatory Visit (INDEPENDENT_AMBULATORY_CARE_PROVIDER_SITE_OTHER): Payer: Medicare Other | Admitting: Family Medicine

## 2021-05-06 ENCOUNTER — Encounter: Payer: Self-pay | Admitting: Family Medicine

## 2021-05-06 ENCOUNTER — Ambulatory Visit (INDEPENDENT_AMBULATORY_CARE_PROVIDER_SITE_OTHER): Payer: Medicare Other

## 2021-05-06 VITALS — BP 104/65 | HR 105 | Temp 98.0°F | Ht 61.0 in | Wt 143.2 lb

## 2021-05-06 DIAGNOSIS — R1084 Generalized abdominal pain: Secondary | ICD-10-CM | POA: Diagnosis not present

## 2021-05-06 DIAGNOSIS — R109 Unspecified abdominal pain: Secondary | ICD-10-CM | POA: Diagnosis not present

## 2021-05-06 DIAGNOSIS — K279 Peptic ulcer, site unspecified, unspecified as acute or chronic, without hemorrhage or perforation: Secondary | ICD-10-CM

## 2021-05-06 MED ORDER — MEGESTROL ACETATE 400 MG/10ML PO SUSP: 400.0000 mg | Freq: Two times a day (BID) | ORAL | 2 refills | Status: DC

## 2021-05-06 MED ORDER — PANTOPRAZOLE SODIUM 40 MG PO TBEC: 40.0000 mg | DELAYED_RELEASE_TABLET | Freq: Every day | ORAL | 11 refills | Status: AC

## 2021-05-06 NOTE — Progress Notes (Signed)
Subjective:  Patient ID: Andrea Moreno, female    DOB: 10/08/1940  Age: 80 y.o. MRN: [id number]  CC: poor appetite and poor sleep   HPI Andrea Moreno presents for decrease in appetite. Gut doesn't feel right. Can only eat a little due to abdominal pain. Sometimes she regurgitates. Having constant pain even though BMs have normalized and she DCed the linzess. Arthritis in back. Change in character recently. Sx onset 3 weeks ago. Sharp. 3-4/10  Depression screen [facility] 2/9 03/22/2021 11/22/2020 10/16/2020  Decreased Interest 0 0 0  Down, Depressed, Hopeless 0 0 0  PHQ - 2 Score 0 0 0  Altered sleeping - 0 0  Tired, decreased energy - 0 0  Change in appetite - 0 0  Feeling bad or failure about yourself  - 0 0  Trouble concentrating - 0 0  Moving slowly or fidgety/restless - 0 0  Suicidal thoughts - 0 0  PHQ-9 Score - 0 0  Difficult doing work/chores - Not difficult at all Not difficult at all  Some recent data might be hidden    History Andrea Moreno has a past medical history of Allergy, Arthritis, Cataract, Complication of anesthesia, Heart murmur, History of migraine, Hyperlipidemia, Hypertension, Osteopenia, Pneumonia, and TIA (transient ischemic attack) (10/14/2012).   She has a past surgical history that includes Appendectomy (1950's); Tonsillectomy (1940's); Back surgery; Dilation and curettage of uterus (~ 1975); Tubal ligation (1960's); Lumbar disc surgery (1975); Colonoscopy; and Total hip arthroplasty (Right, 02/26/2016).   Her family history includes Arthritis in her father, mother, sister, and sister; Diabetes in her mother, sister, and sister; Early death in her brother; Heart attack in her brother and brother; Heart disease in her mother; Hypertension in her sister and sister; Vascular Disease in her brother and mother.She reports that she quit smoking about 35 years ago. Her smoking use included cigarettes. She has a 1.50 pack-year smoking history. She has never used smokeless  tobacco. She reports that she does not currently use alcohol after a past usage of about 1.0 standard drink per week. She reports that she does not use drugs.    ROS Review of Systems  Constitutional: Negative.   HENT: Negative.    Eyes:  Negative for visual disturbance.  Respiratory:  Negative for shortness of breath.   Cardiovascular:  Negative for chest pain.  Gastrointestinal:  Positive for abdominal pain and constipation (resolved recently with use of linzess). Negative for anal bleeding, blood in stool and diarrhea.  Musculoskeletal:  Positive for back pain.   Objective:  BP 104/65   Pulse (!) 105   Temp 98 F (36.7 C)   Ht '5\' 1"'  (1.549 m)   Wt 143 lb 3.2 oz (65 kg)   SpO2 95%   BMI 27.06 kg/m   BP Readings from Last 3 Encounters:  05/06/21 104/65  04/16/21 130/77  02/25/21 128/75    Wt Readings from Last 3 Encounters:  05/06/21 143 lb 3.2 oz (65 kg)  04/16/21 143 lb (64.9 kg)  03/22/21 150 lb (68 kg)     Physical Exam Constitutional:      Appearance: She is well-developed.  HENT:     Head: Normocephalic and atraumatic.  Cardiovascular:     Rate and Rhythm: Normal rate and regular rhythm.     Heart sounds: No murmur heard. Pulmonary:     Effort: Pulmonary effort is normal.     Breath sounds: Normal breath sounds.  Abdominal:     General: Bowel sounds are  normal.     Palpations: Abdomen is soft. There is no mass.     Tenderness: There is abdominal tenderness (epigasrum to LUQ). There is no guarding or rebound.  Skin:    General: Skin is warm and dry.  Neurological:     Mental Status: She is alert and oriented to person, place, and time.  Psychiatric:        Behavior: Behavior normal.      Assessment & Plan:   Andrea Moreno was seen today for poor appetite and poor sleep.  Diagnoses and all orders for this visit:  Generalized abdominal pain -     CBC with Differential/Platelet -     CMP14+EGFR -     Lipase -     DG Abd 2 Views; Future  PUD  (peptic ulcer disease)  Other orders -     pantoprazole (PROTONIX) 40 MG tablet; Take 1 tablet (40 mg total) by mouth daily. For stomach -     megestrol (MEGACE) 400 MG/10ML suspension; Take 10 mLs (400 mg total) by mouth 2 (two) times daily. For appetite stimulation      I am having Andrea Moreno. Poth "francy" start on pantoprazole and megestrol. I am also having her maintain her multivitamin, aspirin, Calcium Citrate (CITRACAL PO), acetaminophen, polyethylene glycol powder, niacin, Potassium Gluconate, Collagen-Vitamin C-Biotin (COLLAGEN 1500/C PO), traMADol, doxepin, raloxifene, losartan, linaclotide, and nabumetone.  Allergies as of 05/06/2021       Reactions   Celebrex [celecoxib]    rash   Demerol [meperidine] Hypertension   Lisinopril    Cough   Statins    Muscle aches   Zyrtec [cetirizine] Itching        Medication List        Accurate as of May 06, 2021  3:41 PM. If you have any questions, ask your nurse or doctor.          acetaminophen 650 MG CR tablet Commonly known as: TYLENOL Take 650 mg by mouth at bedtime.   aspirin 325 MG EC tablet Take 325 mg by mouth daily.   CITRACAL PO Take 600 mg by mouth 2 (two) times daily.   COLLAGEN 1500/C PO Take by mouth.   doxepin 25 MG capsule Commonly known as: SINEQUAN TAKE 1 CAPSULE AT BEDTIME   linaclotide 145 MCG Caps capsule Commonly known as: Linzess Take 1 capsule (145 mcg total) by mouth daily. To regulate bowel movements   losartan 100 MG tablet Commonly known as: COZAAR TAKE 1 TABLET DAILY   megestrol 400 MG/10ML suspension Commonly known as: MEGACE Take 10 mLs (400 mg total) by mouth 2 (two) times daily. For appetite stimulation Started by: Claretta Fraise, MD   multivitamin capsule Take 1 capsule by mouth daily.   nabumetone 500 MG tablet Commonly known as: RELAFEN Take 1 tablet (500 mg total) by mouth 2 (two) times daily. For muscle and joint pain   niacin 500 MG tablet Take 500 mg  by mouth at bedtime.   pantoprazole 40 MG tablet Commonly known as: PROTONIX Take 1 tablet (40 mg total) by mouth daily. For stomach Started by: Claretta Fraise, MD   polyethylene glycol powder 17 GM/SCOOP powder Commonly known as: GLYCOLAX/MIRALAX Take 17 g by mouth 2 (two) times daily as needed.   Potassium Gluconate 550 (90 K) MG Tabs Take by mouth.   raloxifene 60 MG tablet Commonly known as: EVISTA TAKE 1 TABLET DAILY   traMADol 50 MG tablet Commonly known as: ULTRAM Take 1 tablet (50  mg total) by mouth every 12 (twelve) hours as needed.         Follow-up: Return in about 1 month (around 06/06/2021) for with [facility] for abdomen.  Claretta Fraise, M.D.

## 2021-05-07 LAB — CMP14+EGFR
ALT: 10 IU/L (ref 0–32)
AST: 15 IU/L (ref 0–40)
Albumin/Globulin Ratio: 1.4 (ref 1.2–2.2)
Albumin: 3.6 g/dL — ABNORMAL LOW (ref 3.7–4.7)
Alkaline Phosphatase: 47 IU/L (ref 44–121)
BUN/Creatinine Ratio: 18 (ref 12–28)
BUN: 13 mg/dL (ref 8–27)
Bilirubin Total: <0.2 mg/dL (ref 0.0–1.2)
CO2: 22 mmol/L (ref 20–29)
Calcium: 10.9 mg/dL — ABNORMAL HIGH (ref 8.7–10.3)
Chloride: 103 mmol/L (ref 96–106)
Creatinine, Ser: 0.73 mg/dL (ref 0.57–1.00)
Globulin, Total: 2.6 g/dL (ref 1.5–4.5)
Glucose: 116 mg/dL — ABNORMAL HIGH (ref 70–99)
Potassium: 3.7 mmol/L (ref 3.5–5.2)
Sodium: 138 mmol/L (ref 134–144)
Total Protein: 6.2 g/dL (ref 6.0–8.5)
eGFR: 83 mL/min/{1.73_m2} (ref >59)

## 2021-05-07 LAB — CBC WITH DIFFERENTIAL/PLATELET
Basophils Absolute: 0.1 10*3/uL (ref 0.0–0.2)
Basos: 1 %
EOS (ABSOLUTE): 0.5 10*3/uL — ABNORMAL HIGH (ref 0.0–0.4)
Eos: 8 %
Hematocrit: 29.6 % — ABNORMAL LOW (ref 34.0–46.6)
Hemoglobin: 9.8 g/dL — ABNORMAL LOW (ref 11.1–15.9)
Immature Grans (Abs): 0 10*3/uL (ref 0.0–0.1)
Immature Granulocytes: 0 %
Lymphocytes Absolute: 1.1 10*3/uL (ref 0.7–3.1)
Lymphs: 16 %
MCH: 28.6 pg (ref 26.6–33.0)
MCHC: 33.1 g/dL (ref 31.5–35.7)
MCV: 86 fL (ref 79–97)
Monocytes Absolute: 0.6 10*3/uL (ref 0.1–0.9)
Monocytes: 9 %
Neutrophils Absolute: 4.5 10*3/uL (ref 1.4–7.0)
Neutrophils: 66 %
Platelets: 250 10*3/uL (ref 150–450)
RBC: 3.43 x10E6/uL — ABNORMAL LOW (ref 3.77–5.28)
RDW: 12.6 % (ref 11.7–15.4)
WBC: 6.8 10*3/uL (ref 3.4–10.8)

## 2021-05-07 LAB — LIPASE: Lipase: 14 U/L (ref 14–85)

## 2021-05-08 ENCOUNTER — Encounter: Payer: Self-pay | Admitting: Family Medicine

## 2021-05-09 ENCOUNTER — Other Ambulatory Visit: Payer: Self-pay | Admitting: Family Medicine

## 2021-05-09 DIAGNOSIS — R1031 Right lower quadrant pain: Secondary | ICD-10-CM

## 2021-05-21 ENCOUNTER — Other Ambulatory Visit: Payer: Self-pay

## 2021-05-21 ENCOUNTER — Encounter: Payer: Self-pay | Admitting: Family Medicine

## 2021-05-21 ENCOUNTER — Telehealth: Payer: Self-pay

## 2021-05-21 ENCOUNTER — Ambulatory Visit (HOSPITAL_COMMUNITY)
Admission: RE | Admit: 2021-05-21 | Discharge: 2021-05-21 | Disposition: A | Discharge status: Home / Self Care | Payer: Medicare Other | Source: Ambulatory Visit | Attending: Family Medicine | Admitting: Family Medicine

## 2021-05-21 DIAGNOSIS — R188 Other ascites: Secondary | ICD-10-CM | POA: Diagnosis not present

## 2021-05-21 DIAGNOSIS — N281 Cyst of kidney, acquired: Secondary | ICD-10-CM | POA: Diagnosis not present

## 2021-05-21 DIAGNOSIS — R1031 Right lower quadrant pain: Secondary | ICD-10-CM | POA: Diagnosis not present

## 2021-05-21 DIAGNOSIS — N200 Calculus of kidney: Secondary | ICD-10-CM | POA: Diagnosis not present

## 2021-05-21 DIAGNOSIS — I700 Atherosclerosis of aorta: Secondary | ICD-10-CM | POA: Diagnosis not present

## 2021-05-21 DIAGNOSIS — K869 Disease of pancreas, unspecified: Secondary | ICD-10-CM

## 2021-05-21 NOTE — Telephone Encounter (Signed)
[facility] radiology called about pt stating stacks ordered cts can and it was abnormal finding; a large 7.1cm mass in the panceatic body. Showing pancreatic malignancy and going into stomach with mets

## 2021-05-21 NOTE — Telephone Encounter (Signed)
Sent to [employer] and PCP for review

## 2021-05-21 NOTE — Telephone Encounter (Signed)
Hold for [employer]

## 2021-05-21 NOTE — Addendum Note (Signed)
Addended by: Evelina Dun A on: 05/21/2021 03:55 PM   Modules accepted: Orders

## 2021-05-23 ENCOUNTER — Telehealth: Payer: Self-pay | Admitting: Family

## 2021-05-28 ENCOUNTER — Ambulatory Visit (INDEPENDENT_AMBULATORY_CARE_PROVIDER_SITE_OTHER): Payer: Medicare Other | Admitting: Family

## 2021-05-28 ENCOUNTER — Encounter: Payer: Self-pay | Admitting: Family

## 2021-05-28 VITALS — BP 172/77 | HR 71 | Temp 98.0°F | Ht 61.0 in | Wt 139.4 lb

## 2021-05-28 DIAGNOSIS — F411 Generalized anxiety disorder: Secondary | ICD-10-CM | POA: Diagnosis not present

## 2021-05-28 DIAGNOSIS — G4700 Insomnia, unspecified: Secondary | ICD-10-CM | POA: Diagnosis not present

## 2021-05-28 DIAGNOSIS — G8929 Other chronic pain: Secondary | ICD-10-CM

## 2021-05-28 DIAGNOSIS — K869 Disease of pancreas, unspecified: Secondary | ICD-10-CM

## 2021-05-28 DIAGNOSIS — I10 Essential (primary) hypertension: Secondary | ICD-10-CM

## 2021-05-28 DIAGNOSIS — E785 Hyperlipidemia, unspecified: Secondary | ICD-10-CM | POA: Diagnosis not present

## 2021-05-28 DIAGNOSIS — M159 Polyosteoarthritis, unspecified: Secondary | ICD-10-CM

## 2021-05-28 DIAGNOSIS — M545 Low back pain, unspecified: Secondary | ICD-10-CM

## 2021-05-28 DIAGNOSIS — K5900 Constipation, unspecified: Secondary | ICD-10-CM | POA: Diagnosis not present

## 2021-05-28 MED ORDER — OXYCODONE HCL 5 MG PO CAPS: 5.0000 mg | ORAL_CAPSULE | Freq: Three times a day (TID) | ORAL | 0 refills | Status: DC | PRN

## 2021-05-28 MED ORDER — ONDANSETRON HCL 4 MG PO TABS: 4.0000 mg | ORAL_TABLET | Freq: Three times a day (TID) | ORAL | 0 refills | Status: AC | PRN

## 2021-05-28 MED ORDER — LINACLOTIDE 290 MCG PO CAPS: 290.0000 ug | ORAL_CAPSULE | Freq: Every day | ORAL | 2 refills | Status: AC

## 2021-05-28 NOTE — Progress Notes (Signed)
Subjective:    Patient ID: Andrea Moreno, female    DOB: [birth year], 80 y.o.   MRN: [id number]  No chief complaint on file.  PT presents to the office today for chronic follow up. Since our last visit she had a CT scan that showed pancreatic lesion and mets to liver and lymph node.   She is in a great deal of pain. She has not seen Oncologists yet.  Abdominal Pain This is a chronic problem. The current episode started more than 1 year ago. The problem occurs constantly.  Back Pain This is a new problem. The problem occurs constantly. The pain is present in the thoracic spine. The pain is at a severity of 10/10. The pain is moderate. Associated symptoms include abdominal pain.  Hypertension This is a chronic problem. The current episode started more than 1 year ago. The problem has been waxing and waning since onset. The problem is uncontrolled. Associated symptoms include anxiety and malaise/fatigue. Pertinent negatives include no peripheral edema.  Insomnia Primary symptoms: difficulty falling asleep, frequent awakening, malaise/fatigue.   The current episode started more than one year. The onset quality is gradual.  Anxiety Presents for follow-up visit. Symptoms include depressed mood, excessive worry, insomnia, irritability and nervous/anxious behavior. Symptoms occur most days. The severity of symptoms is moderate.       Review of Systems  Constitutional:  Positive for irritability and malaise/fatigue.  Gastrointestinal:  Positive for abdominal pain.  Musculoskeletal:  Positive for back pain.  Psychiatric/Behavioral:  The patient is nervous/anxious and has insomnia.   All other systems reviewed and are negative.     Objective:   Physical Exam Vitals reviewed.  Constitutional:      General: She is not in acute distress.    Appearance: She is well-developed.  HENT:     Head: Normocephalic and atraumatic.     Right Ear: Tympanic membrane normal.     Left Ear: Tympanic  membrane normal.  Eyes:     Pupils: Pupils are equal, round, and reactive to light.  Neck:     Thyroid: No thyromegaly.  Cardiovascular:     Rate and Rhythm: Normal rate and regular rhythm.     Heart sounds: Normal heart sounds. No murmur heard. Pulmonary:     Effort: Pulmonary effort is normal. No respiratory distress.     Breath sounds: Normal breath sounds. No wheezing.  Abdominal:     General: Bowel sounds are normal. There is no distension.     Palpations: Abdomen is soft.     Tenderness: There is no abdominal tenderness.  Musculoskeletal:        General: No tenderness.     Cervical back: Normal range of motion and neck supple.     Right lower leg: Edema (2+) present.     Left lower leg: Edema (2+) present.     Comments: Pain in thoracic pain with any movement  Skin:    General: Skin is warm and dry.  Neurological:     Mental Status: She is alert and oriented to person, place, and time.     Cranial Nerves: No cranial nerve deficit.     Motor: Weakness present.     Gait: Gait abnormal.     Deep Tendon Reflexes: Reflexes are normal and symmetric.  Psychiatric:        Behavior: Behavior normal.        Thought Content: Thought content normal.        Judgment: Judgment normal.  BP (!) 172/77    Pulse 71    Temp 98 F (36.7 C) (Temporal)    Ht 5\' 1"  (1.549 m)    Wt 139 lb 6.4 oz (63.2 kg)    BMI 26.34 kg/m      Assessment & Plan:  NIKA YAZZIE comes in today with chief complaint of No chief complaint on file.   Diagnosis and orders addressed:  1. Primary hypertension  2. Osteoarthritis of multiple joints, unspecified osteoarthritis type  3. Chronic bilateral low back pain, unspecified whether sciatica present - oxycodone (OXY-IR) 5 MG capsule; Take 1 capsule (5 mg total) by mouth every 8 (eight) hours as needed.  Dispense: 90 capsule; Refill: 0  4. Insomnia, unspecified type  5. Hyperlipidemia, unspecified hyperlipidemia type  6. GAD (generalized  anxiety disorder)  7. Pancreatic lesion - oxycodone (OXY-IR) 5 MG capsule; Take 1 capsule (5 mg total) by mouth every 8 (eight) hours as needed.  Dispense: 90 capsule; Refill: 0 - ondansetron (ZOFRAN) 4 MG tablet; Take 1 tablet (4 mg total) by mouth every 8 (eight) hours as needed for nausea or vomiting.  Dispense: 60 tablet; Refill: 0  8. Constipation, unspecified constipation type - linaclotide (LINZESS) 290 MCG CAPS capsule; Take 1 capsule (290 mcg total) by mouth daily before breakfast.  Dispense: 30 capsule; Refill: 2   Will give oxycodone 5 mg TID prn  Start Linzess 290 mcg daily Force fluids Keep Oncologists follow up Zofran as needed  Health Maintenance reviewed Diet and exercise encouraged  Follow up plan: 3 months    Evelina Dun, FNP

## 2021-05-28 NOTE — Patient Instructions (Signed)

## 2021-06-04 ENCOUNTER — Emergency Department (HOSPITAL_COMMUNITY): Payer: Medicare Other

## 2021-06-04 ENCOUNTER — Telehealth: Payer: Self-pay | Admitting: Family

## 2021-06-04 ENCOUNTER — Other Ambulatory Visit: Payer: Self-pay

## 2021-06-04 ENCOUNTER — Encounter (HOSPITAL_COMMUNITY): Payer: Self-pay | Admitting: Emergency Medicine

## 2021-06-04 ENCOUNTER — Inpatient Hospital Stay (HOSPITAL_COMMUNITY)
Admission: EM | Admit: 2021-06-04 | Discharge: 2021-06-10 | DRG: 175 | Disposition: A | Discharge status: Hospice | Payer: Medicare Other | Attending: Internal Medicine | Admitting: Internal Medicine

## 2021-06-04 DIAGNOSIS — Z8673 Personal history of transient ischemic attack (TIA), and cerebral infarction without residual deficits: Secondary | ICD-10-CM

## 2021-06-04 DIAGNOSIS — Z96641 Presence of right artificial hip joint: Secondary | ICD-10-CM | POA: Diagnosis present

## 2021-06-04 DIAGNOSIS — K769 Liver disease, unspecified: Secondary | ICD-10-CM | POA: Diagnosis not present

## 2021-06-04 DIAGNOSIS — I2694 Multiple subsegmental pulmonary emboli without acute cor pulmonale: Principal | ICD-10-CM | POA: Diagnosis present

## 2021-06-04 DIAGNOSIS — I2693 Single subsegmental pulmonary embolism without acute cor pulmonale: Secondary | ICD-10-CM | POA: Diagnosis not present

## 2021-06-04 DIAGNOSIS — F413 Other mixed anxiety disorders: Secondary | ICD-10-CM | POA: Diagnosis not present

## 2021-06-04 DIAGNOSIS — S22070A Wedge compression fracture of T9-T10 vertebra, initial encounter for closed fracture: Secondary | ICD-10-CM

## 2021-06-04 DIAGNOSIS — M549 Dorsalgia, unspecified: Secondary | ICD-10-CM | POA: Diagnosis not present

## 2021-06-04 DIAGNOSIS — I10 Essential (primary) hypertension: Secondary | ICD-10-CM | POA: Diagnosis not present

## 2021-06-04 DIAGNOSIS — M48061 Spinal stenosis, lumbar region without neurogenic claudication: Secondary | ICD-10-CM | POA: Diagnosis present

## 2021-06-04 DIAGNOSIS — Z87891 Personal history of nicotine dependence: Secondary | ICD-10-CM

## 2021-06-04 DIAGNOSIS — Z7982 Long term (current) use of aspirin: Secondary | ICD-10-CM

## 2021-06-04 DIAGNOSIS — R7989 Other specified abnormal findings of blood chemistry: Secondary | ICD-10-CM | POA: Diagnosis present

## 2021-06-04 DIAGNOSIS — I2699 Other pulmonary embolism without acute cor pulmonale: Secondary | ICD-10-CM

## 2021-06-04 DIAGNOSIS — Z20822 Contact with and (suspected) exposure to covid-19: Secondary | ICD-10-CM | POA: Diagnosis present

## 2021-06-04 DIAGNOSIS — G8929 Other chronic pain: Secondary | ICD-10-CM | POA: Diagnosis not present

## 2021-06-04 DIAGNOSIS — C259 Malignant neoplasm of pancreas, unspecified: Secondary | ICD-10-CM

## 2021-06-04 DIAGNOSIS — I82890 Acute embolism and thrombosis of other specified veins: Secondary | ICD-10-CM | POA: Diagnosis present

## 2021-06-04 DIAGNOSIS — C787 Secondary malignant neoplasm of liver and intrahepatic bile duct: Secondary | ICD-10-CM | POA: Diagnosis not present

## 2021-06-04 DIAGNOSIS — E871 Hypo-osmolality and hyponatremia: Secondary | ICD-10-CM | POA: Diagnosis present

## 2021-06-04 DIAGNOSIS — I81 Portal vein thrombosis: Secondary | ICD-10-CM | POA: Diagnosis not present

## 2021-06-04 DIAGNOSIS — Z87442 Personal history of urinary calculi: Secondary | ICD-10-CM

## 2021-06-04 DIAGNOSIS — K449 Diaphragmatic hernia without obstruction or gangrene: Secondary | ICD-10-CM | POA: Diagnosis not present

## 2021-06-04 DIAGNOSIS — Z66 Do not resuscitate: Secondary | ICD-10-CM | POA: Diagnosis not present

## 2021-06-04 DIAGNOSIS — N200 Calculus of kidney: Secondary | ICD-10-CM | POA: Diagnosis not present

## 2021-06-04 DIAGNOSIS — R110 Nausea: Secondary | ICD-10-CM | POA: Diagnosis not present

## 2021-06-04 DIAGNOSIS — R739 Hyperglycemia, unspecified: Secondary | ICD-10-CM | POA: Diagnosis not present

## 2021-06-04 DIAGNOSIS — M4854XA Collapsed vertebra, not elsewhere classified, thoracic region, initial encounter for fracture: Secondary | ICD-10-CM | POA: Diagnosis present

## 2021-06-04 DIAGNOSIS — R778 Other specified abnormalities of plasma proteins: Secondary | ICD-10-CM | POA: Diagnosis not present

## 2021-06-04 DIAGNOSIS — R279 Unspecified lack of coordination: Secondary | ICD-10-CM | POA: Diagnosis not present

## 2021-06-04 DIAGNOSIS — Z8249 Family history of ischemic heart disease and other diseases of the circulatory system: Secondary | ICD-10-CM

## 2021-06-04 DIAGNOSIS — Z515 Encounter for palliative care: Secondary | ICD-10-CM

## 2021-06-04 DIAGNOSIS — I248 Other forms of acute ischemic heart disease: Secondary | ICD-10-CM | POA: Diagnosis present

## 2021-06-04 DIAGNOSIS — K8689 Other specified diseases of pancreas: Secondary | ICD-10-CM | POA: Diagnosis not present

## 2021-06-04 DIAGNOSIS — D735 Infarction of spleen: Secondary | ICD-10-CM | POA: Diagnosis not present

## 2021-06-04 DIAGNOSIS — I214 Non-ST elevation (NSTEMI) myocardial infarction: Secondary | ICD-10-CM

## 2021-06-04 DIAGNOSIS — J9811 Atelectasis: Secondary | ICD-10-CM | POA: Diagnosis present

## 2021-06-04 DIAGNOSIS — R0600 Dyspnea, unspecified: Secondary | ICD-10-CM | POA: Diagnosis not present

## 2021-06-04 DIAGNOSIS — K869 Disease of pancreas, unspecified: Secondary | ICD-10-CM

## 2021-06-04 DIAGNOSIS — Z833 Family history of diabetes mellitus: Secondary | ICD-10-CM

## 2021-06-04 DIAGNOSIS — M8580 Other specified disorders of bone density and structure, unspecified site: Secondary | ICD-10-CM | POA: Diagnosis present

## 2021-06-04 DIAGNOSIS — Z888 Allergy status to other drugs, medicaments and biological substances status: Secondary | ICD-10-CM

## 2021-06-04 DIAGNOSIS — J9601 Acute respiratory failure with hypoxia: Secondary | ICD-10-CM | POA: Diagnosis present

## 2021-06-04 DIAGNOSIS — J90 Pleural effusion, not elsewhere classified: Secondary | ICD-10-CM | POA: Diagnosis not present

## 2021-06-04 DIAGNOSIS — Z8261 Family history of arthritis: Secondary | ICD-10-CM

## 2021-06-04 DIAGNOSIS — M47816 Spondylosis without myelopathy or radiculopathy, lumbar region: Secondary | ICD-10-CM | POA: Diagnosis not present

## 2021-06-04 DIAGNOSIS — R404 Transient alteration of awareness: Secondary | ICD-10-CM | POA: Diagnosis not present

## 2021-06-04 DIAGNOSIS — M4186 Other forms of scoliosis, lumbar region: Secondary | ICD-10-CM | POA: Diagnosis not present

## 2021-06-04 DIAGNOSIS — Z7401 Bed confinement status: Secondary | ICD-10-CM | POA: Diagnosis not present

## 2021-06-04 DIAGNOSIS — M40204 Unspecified kyphosis, thoracic region: Secondary | ICD-10-CM | POA: Diagnosis not present

## 2021-06-04 DIAGNOSIS — K828 Other specified diseases of gallbladder: Secondary | ICD-10-CM | POA: Diagnosis not present

## 2021-06-04 DIAGNOSIS — S3992XA Unspecified injury of lower back, initial encounter: Secondary | ICD-10-CM | POA: Diagnosis not present

## 2021-06-04 DIAGNOSIS — K5900 Constipation, unspecified: Secondary | ICD-10-CM | POA: Diagnosis present

## 2021-06-04 DIAGNOSIS — Z79899 Other long term (current) drug therapy: Secondary | ICD-10-CM

## 2021-06-04 DIAGNOSIS — N281 Cyst of kidney, acquired: Secondary | ICD-10-CM | POA: Diagnosis not present

## 2021-06-04 DIAGNOSIS — R079 Chest pain, unspecified: Secondary | ICD-10-CM | POA: Diagnosis not present

## 2021-06-04 DIAGNOSIS — R52 Pain, unspecified: Secondary | ICD-10-CM | POA: Diagnosis not present

## 2021-06-04 DIAGNOSIS — Z049 Encounter for examination and observation for unspecified reason: Secondary | ICD-10-CM | POA: Diagnosis not present

## 2021-06-04 DIAGNOSIS — C7951 Secondary malignant neoplasm of bone: Secondary | ICD-10-CM | POA: Diagnosis present

## 2021-06-04 DIAGNOSIS — K219 Gastro-esophageal reflux disease without esophagitis: Secondary | ICD-10-CM | POA: Diagnosis present

## 2021-06-04 DIAGNOSIS — R0602 Shortness of breath: Secondary | ICD-10-CM | POA: Diagnosis not present

## 2021-06-04 DIAGNOSIS — K7689 Other specified diseases of liver: Secondary | ICD-10-CM | POA: Diagnosis not present

## 2021-06-04 LAB — CBC WITH DIFFERENTIAL/PLATELET
Abs Immature Granulocytes: 0.04 10*3/uL (ref 0.00–0.07)
Basophils Absolute: 0.1 10*3/uL (ref 0.0–0.1)
Basophils Relative: 1 %
Eosinophils Absolute: 0.3 10*3/uL (ref 0.0–0.5)
Eosinophils Relative: 3 %
HCT: 28.9 % — ABNORMAL LOW (ref 36.0–46.0)
Hemoglobin: 9.2 g/dL — ABNORMAL LOW (ref 12.0–15.0)
Immature Granulocytes: 0 %
Lymphocytes Relative: 10 %
Lymphs Abs: 1 10*3/uL (ref 0.7–4.0)
MCH: 27.8 pg (ref 26.0–34.0)
MCHC: 31.8 g/dL (ref 30.0–36.0)
MCV: 87.3 fL (ref 80.0–100.0)
Monocytes Absolute: 0.8 10*3/uL (ref 0.1–1.0)
Monocytes Relative: 9 %
Neutro Abs: 7 10*3/uL (ref 1.7–7.7)
Neutrophils Relative %: 77 %
Platelets: 170 10*3/uL (ref 150–400)
RBC: 3.31 MIL/uL — ABNORMAL LOW (ref 3.87–5.11)
RDW: 15.5 % (ref 11.5–15.5)
WBC: 9.2 10*3/uL (ref 4.0–10.5)
nRBC: 0 % (ref 0.0–0.2)

## 2021-06-04 LAB — RESP PANEL BY RT-PCR (FLU A&B, COVID) ARPGX2
Influenza A by PCR: NEGATIVE
Influenza B by PCR: NEGATIVE
SARS Coronavirus 2 by RT PCR: NEGATIVE

## 2021-06-04 LAB — COMPREHENSIVE METABOLIC PANEL
ALT: 46 U/L — ABNORMAL HIGH (ref 0–44)
AST: 42 U/L — ABNORMAL HIGH (ref 15–41)
Albumin: 3.3 g/dL — ABNORMAL LOW (ref 3.5–5.0)
Alkaline Phosphatase: 58 U/L (ref 38–126)
Anion gap: 9 (ref 5–15)
BUN: 25 mg/dL — ABNORMAL HIGH (ref 8–23)
CO2: 22 mmol/L (ref 22–32)
Calcium: 12.6 mg/dL — ABNORMAL HIGH (ref 8.9–10.3)
Chloride: 103 mmol/L (ref 98–111)
Creatinine, Ser: 0.89 mg/dL (ref 0.44–1.00)
GFR, Estimated: >60 mL/min (ref >60)
Glucose, Bld: 106 mg/dL — ABNORMAL HIGH (ref 70–99)
Potassium: 4.2 mmol/L (ref 3.5–5.1)
Sodium: 134 mmol/L — ABNORMAL LOW (ref 135–145)
Total Bilirubin: 0.9 mg/dL (ref 0.3–1.2)
Total Protein: 6.7 g/dL (ref 6.5–8.1)

## 2021-06-04 LAB — TROPONIN I (HIGH SENSITIVITY)
Troponin I (High Sensitivity): 59 ng/L — ABNORMAL HIGH (ref <18)
Troponin I (High Sensitivity): 60 ng/L — ABNORMAL HIGH (ref <18)

## 2021-06-04 LAB — BLOOD GAS, VENOUS
Acid-base deficit: 0.6 mmol/L (ref 0.0–2.0)
Bicarbonate: 23.3 mmol/L (ref 20.0–28.0)
Drawn by: 6352
FIO2: 28
O2 Saturation: 58.8 %
Patient temperature: 36.8
pCO2, Ven: 40.1 mmHg — ABNORMAL LOW (ref 44.0–60.0)
pH, Ven: 7.389 (ref 7.250–7.430)
pO2, Ven: 37.8 mmHg (ref 32.0–45.0)

## 2021-06-04 LAB — BRAIN NATRIURETIC PEPTIDE: B Natriuretic Peptide: 151 pg/mL — ABNORMAL HIGH (ref 0.0–100.0)

## 2021-06-04 LAB — D-DIMER, QUANTITATIVE: D-Dimer, Quant: 10.85 ug/mL-FEU — ABNORMAL HIGH (ref 0.00–0.50)

## 2021-06-04 MED ORDER — OXYCODONE HCL 5 MG PO TABS
5.0000 mg | ORAL_TABLET | ORAL | Status: DC | PRN
  Administered 2021-06-05 – 2021-06-10 (×10): 5 mg via ORAL
  Filled 2021-06-04 (×11): qty 1

## 2021-06-04 MED ORDER — METHOCARBAMOL 1000 MG/10ML IJ SOLN
500.0000 mg | Freq: Four times a day (QID) | INTRAVENOUS | Status: DC | PRN
  Filled 2021-06-04: qty 5

## 2021-06-04 MED ORDER — HYDROMORPHONE HCL 1 MG/ML IJ SOLN
1.0000 mg | Freq: Once | INTRAMUSCULAR | Status: AC
  Administered 2021-06-04: 1 mg via INTRAVENOUS
  Filled 2021-06-04: qty 1

## 2021-06-04 MED ORDER — SODIUM CHLORIDE 0.9 % IV SOLN: Freq: Once | INTRAVENOUS | Status: AC

## 2021-06-04 MED ORDER — IOHEXOL 350 MG/ML SOLN
100.0000 mL | Freq: Once | INTRAVENOUS | Status: AC | PRN
  Administered 2021-06-04: 80 mL via INTRAVENOUS

## 2021-06-04 MED ORDER — ONDANSETRON HCL 4 MG/2ML IJ SOLN
4.0000 mg | Freq: Once | INTRAMUSCULAR | Status: AC
Start: 2021-06-04 — End: 2021-06-04
  Administered 2021-06-04: 4 mg via INTRAVENOUS
  Filled 2021-06-04: qty 2

## 2021-06-04 MED ORDER — HEPARIN (PORCINE) 25000 UT/250ML-% IV SOLN
1250.0000 [IU]/h | INTRAVENOUS | Status: DC
  Administered 2021-06-04 – 2021-06-05 (×2): 1000 [IU]/h via INTRAVENOUS
  Administered 2021-06-06: 1250 [IU]/h via INTRAVENOUS
  Filled 2021-06-04 (×3): qty 250

## 2021-06-04 MED ORDER — ACETAMINOPHEN 325 MG PO TABS: 650.0000 mg | ORAL_TABLET | Freq: Four times a day (QID) | ORAL | Status: DC | PRN

## 2021-06-04 MED ORDER — ASPIRIN 325 MG PO TABS
325.0000 mg | ORAL_TABLET | Freq: Every day | ORAL | Status: DC
  Administered 2021-06-04 – 2021-06-10 (×7): 325 mg via ORAL
  Filled 2021-06-04 (×7): qty 1

## 2021-06-04 MED ORDER — HYDROMORPHONE HCL 1 MG/ML IJ SOLN
0.5000 mg | INTRAMUSCULAR | Status: DC | PRN
  Administered 2021-06-05 – 2021-06-10 (×11): 0.5 mg via INTRAVENOUS
  Filled 2021-06-04 (×11): qty 0.5

## 2021-06-04 MED ORDER — ACETAMINOPHEN 650 MG RE SUPP: 650.0000 mg | Freq: Four times a day (QID) | RECTAL | Status: DC | PRN

## 2021-06-04 MED ORDER — HEPARIN BOLUS VIA INFUSION
4000.0000 [IU] | Freq: Once | INTRAVENOUS | Status: AC
  Administered 2021-06-04: 4000 [IU] via INTRAVENOUS

## 2021-06-04 MED ORDER — OXYCODONE HCL 5 MG PO CAPS: 5.0000 mg | ORAL_CAPSULE | ORAL | 0 refills | Status: DC | PRN

## 2021-06-04 NOTE — H&P (Signed)
History and Physical  Andrea Moreno XHB:[id number] DOB: 05-Jan-1941 DOA: 06/04/2021  Referring physician: Jeanell Sparrow, DO PCP: Sharion Balloon, FNP  Patient coming from: Home  Chief Complaint: Back pain, shortness of breath  HPI: Andrea Moreno is a 81 y.o. female with medical history significant for  Pancreatic cancer (not yet on chemotherapy), essential hypertension, GERD who presents to the emergency department via EMS with complaint of 3 months of worsening chronic back pain with minimal to no relief on oral analgesics.  Back pain rapidly worsened within the past week, pain was midthoracic with radiation to the ribs (R > L).  This was associated with a decreased activity within past few weeks due to intensity of the back pain.  EMS was activated due to shortness of breath, on arrival of EMS team, patient was noted to be hypoxic in the 80s (patient does not use oxygen at baseline).  Supplemental oxygen via [place] at 2 LPM was provided with improvement in O2 sats, though she still complains of pleuritic chest pain which worsens with breathing and also complains of few days of constipation, but denies fever, chills, chest congestion, cough, numbness, tingling, urine/bowel incontinence.  Patient has a pending schedule to see hematology/oncology on Thursday (1/4)  ED Course:  In the emergency department, she was intermittently tachypneic, other vital signs were within normal range.  Work-up in the ED showed normocytic anemia, normal BMP except for elevated BUN at 25 and mild hyponatremia, BNP 151, troponin x1 -59, D-dimer 10.85.  Influenza A, B, SARS coronavirus 2 was negative. CT angiography of chest showed segmental and subsegmental right lower lobe pulmonary emboli. Subsegmental right middle lobe pulmonary emboli. (RV/LV Ratio = 1.0).  Trace right pleural effusion. Minimal patchy airspace disease and ground-glass opacities in the right lower lobe suspicious for infection. T10 compression  fracture is new from May 21, 2021. CT abdomen and pelvis with contrast showed large mass involving the pancreatic body and tail most compatible with primary pancreatic neoplasm. Questionable ductal dilatation in the head of the pancreas versus second smaller lesion. 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 Likely small splenic infarct. There are 2 suspicious lesions in the left lobe of the liver, likely metastatic disease. Patient was started on heparin drip, aspirin 325 mg was given, Zofran was given, Dilaudid was given due to back pain.  Hospitalist was asked to admit patient for further evaluation and management.  Review of Systems: A full 10 point Review of Systems was done, except as stated above, all other Review of systems were negative.  Past Medical History:  Diagnosis Date   Allergy    Arthritis    "back, right shoulder, hips" (10/14/2012)   Cataract    Complication of anesthesia    "1950's had appendix out; kicked the RN; no problems since" (10/14/2012)   Heart murmur    normal Echo 2014 per pt   History of migraine    1960s   Hyperlipidemia    Hypertension    Osteopenia    Pneumonia    "couple of times; last time in the mid 1990's" (10/14/2012)   TIA (transient ischemic attack) 10/14/2012   Past Surgical History:  Procedure Laterality Date   APPENDECTOMY  1950's   BACK SURGERY     COLONOSCOPY     DILATION AND CURETTAGE OF UTERUS  ~ [place]  1975   "had a disc removed" (10/14/2012)   TONSILLECTOMY  1940's   TOTAL HIP ARTHROPLASTY Right 02/26/2016   Procedure: TOTAL HIP ARTHROPLASTY ANTERIOR APPROACH;  Surgeon: Renette Butters, MD;  Location: [facility];  Service: Orthopedics;  Laterality: Right;   TUBAL LIGATION  1960's    Social History:  reports that she quit smoking about 35 years ago. Her smoking use  included cigarettes. She has a 1.50 pack-year smoking history. She has never used smokeless tobacco. She reports that she does not currently use alcohol after a past usage of about 1.0 standard drink per week. She reports that she does not use drugs.   Allergies  Allergen Reactions   Celebrex [Celecoxib]     rash   Demerol [Meperidine] Hypertension   Lisinopril     Cough   Statins     Muscle aches   Zyrtec [Cetirizine] Itching    Family History  Problem Relation Age of Onset   Heart disease Mother    Diabetes Mother    Arthritis Mother    Vascular Disease Mother    Diabetes Sister    Hypertension Sister    Arthritis Sister    Early death Brother    Vascular Disease Brother    Heart attack Brother    Arthritis Father    Heart attack Brother    Diabetes Sister    Hypertension Sister    Arthritis Sister     Prior to Admission medications   Medication Sig Start Date End Date Taking? Authorizing Provider  acetaminophen (TYLENOL) 650 MG CR tablet Take 650 mg by mouth at bedtime.    [provider]  aspirin 325 MG EC tablet Take 325 mg by mouth daily.    [provider]  Calcium Citrate (CITRACAL PO) Take 600 mg by mouth 2 (two) times daily.    [provider]  Collagen-Vitamin C-Biotin (COLLAGEN 1500/C PO) Take by mouth.    [provider]  doxepin (SINEQUAN) 25 MG capsule TAKE 1 CAPSULE AT BEDTIME 03/11/21   Sharion Balloon, FNP  linaclotide [facility]) 290 MCG CAPS capsule Take 1 capsule (290 mcg total) by mouth daily before breakfast. 05/28/21   Sharion Balloon, FNP  losartan (COZAAR) 100 MG tablet TAKE 1 TABLET DAILY 04/07/21   Evelina Dun A, FNP  megestrol (MEGACE) 400 MG/10ML suspension Take 10 mLs (400 mg total) by mouth 2 (two) times daily. For appetite stimulation 05/06/21   Claretta Fraise, MD  Multiple Vitamin (MULTIVITAMIN) capsule Take 1 capsule by mouth daily.    [provider]  niacin 500 MG tablet Take 500 mg by mouth  at bedtime.    [provider]  ondansetron (ZOFRAN) 4 MG tablet Take 1 tablet (4 mg total) by mouth every 8 (eight) hours as needed for nausea or vomiting. 05/28/21   Evelina Dun A, FNP  oxycodone (OXY-IR) 5 MG capsule Take 1 capsule (5 mg total) by mouth every 4 (four) hours as needed. 06/04/21   Evelina Dun A, FNP  pantoprazole (PROTONIX) 40 MG tablet Take 1 tablet (40 mg total) by mouth daily. For stomach 05/06/21   Claretta Fraise, MD  polyethylene glycol powder (GLYCOLAX/MIRALAX) 17 GM/SCOOP powder Take 17 g by mouth 2 (two) times daily as needed. 12/12/19   Evelina Dun A, FNP  Potassium Gluconate 550 (90 K) MG TABS Take by mouth.    [provider]  raloxifene (EVISTA) 60 MG tablet TAKE 1 TABLET DAILY 04/07/21   Evelina Dun A,  FNP    Physical Exam: BP (!) 102/52    Pulse 78    Temp 98.3 F (36.8 C) (Oral)    Resp 14    Ht 5\' 1"  (1.549 m)    Wt 63.2 kg    SpO2 94%    BMI 26.34 kg/m   General: 81 y.o. year-old female well developed well nourished in no acute distress.  Alert and oriented x3. HEENT: NCAT, EOMI Neck: Supple, trachea medial Cardiovascular: Regular rate and rhythm with no rubs or gallops.  No thyromegaly or JVD noted.  No lower extremity edema. 2/4 pulses in all 4 extremities. Respiratory: Clear to auscultation with no wheezes or rales. Good inspiratory effort. Abdomen: Soft, nontender nondistended with normal bowel sounds x4 quadrants. Muskuloskeletal: No cyanosis, clubbing or edema noted bilaterally Neuro: CN II-XII intact, strength 5/5 x 4, sensation, reflexes intact Skin: No ulcerative lesions noted or rashes Psychiatry: Judgement and insight appear normal. Mood is appropriate for condition and setting          Labs on Admission:  Basic Metabolic Panel: Recent Labs  Lab 06/04/21 1645  NA 134*  K 4.2  CL 103  CO2 22  GLUCOSE 106*  BUN 25*  CREATININE 0.89  CALCIUM 12.6*   Liver Function Tests: Recent Labs  Lab 06/04/21 1645   AST 42*  ALT 46*  ALKPHOS 58  BILITOT 0.9  PROT 6.7  ALBUMIN 3.3*   No results for input(s): LIPASE, AMYLASE in the last 168 hours. No results for input(s): AMMONIA in the last 168 hours. CBC: Recent Labs  Lab 06/04/21 1645  WBC 9.2  NEUTROABS 7.0  HGB 9.2*  HCT 28.9*  MCV 87.3  PLT 170   Cardiac Enzymes: No results for input(s): CKTOTAL, CKMB, CKMBINDEX, TROPONINI in the last 168 hours.  BNP (last 3 results) Recent Labs    06/04/21 1645  BNP 151.0*    ProBNP (last 3 results) No results for input(s): PROBNP in the last 8760 hours.  CBG: No results for input(s): GLUCAP in the last 168 hours.  Radiological Exams on Admission: CT Angio Chest PE W and/or Wo Contrast  Result Date: 06/04/2021 CLINICAL DATA:  Epigastric pain.  Pancreatic malignancy. EXAM: CT ANGIOGRAPHY CHEST CT ABDOMEN AND PELVIS WITH CONTRAST TECHNIQUE: Multidetector CT imaging of the chest was performed using the standard protocol during bolus administration of intravenous contrast. Multiplanar CT image reconstructions and MIPs were obtained to evaluate the vascular anatomy. Multidetector CT imaging of the abdomen and pelvis was performed using the standard protocol during bolus administration of intravenous contrast. CONTRAST:  71mL OMNIPAQUE IOHEXOL 350 MG/ML SOLN COMPARISON:  CT renal stone 05/21/2021. FINDINGS: CTA CHEST FINDINGS Cardiovascular: The heart and aorta are normal in size. There are atherosclerotic calcifications of the aorta. There is no pericardial effusion. There is adequate opacification of the pulmonary arteries to the segmental level. There are segmental and subsegmental right lower lobe pulmonary emboli. There are subsegmental right middle lobe pulmonary emboli. Mediastinum/Nodes: There is a moderate hiatal hernia. The esophagus is nondilated. There are no enlarged mediastinal or hilar lymph nodes. Lungs/Pleura: There is a trace right pleural effusion. There is bilateral lower lung  atelectasis. There is a small amount of patchy airspace and ground-glass opacity in the right lower lobe. Trachea and central airways are patent. No pneumothorax. No suspicious pulmonary nodules. Musculoskeletal: No chest wall abnormality. No acute or significant osseous findings. There is moderate compression fracture of T10 which is new from the prior examination. No axillary lymphadenopathy.  Review of the MIP images confirms the above findings. CT ABDOMEN and PELVIS FINDINGS Hepatobiliary: There is a 15 mm hypodense lesion in the left lobe of the liver image 2/19. There is a similar appearing hypodense lesion measuring 2 cm in the left lobe of the liver image 2/32. Small left hepatic cyst in the left lobe measures 9 mm. No gallstones are identified. Gallbladder sludge is likely present. No definite biliary ductal dilatation. Pancreas: Lobulated low-density mass in the region of the body and tail the pancreas measures 5.8 x 11.4 x 5.5 cm. Low-attenuation lesion in the head of the pancreas versus dilated duct seen on image 2/26 measuring 15 mm. There is loss of fat plane between the lesser curvature of the stomach. This mass encompasses the celiac axis and its proximal branches, possibly causing occlusion. Superior mesenteric vein not definitely involved. 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 Splenic vein not visualized and may be occluded as well. Spleen: There is small wedge-shaped area of hypodensity in the spleen suspicious for infarct. No perisplenic fluid collection identified. The spleen is nonenlarged. Adrenals/Urinary Tract: The bilateral adrenal glands are within normal limits. There are punctate nonobstructing right renal calculi. There is no hydronephrosis in either kidney. There is a 5.2 by 3.8 cm cyst in the right kidney. There is an additional cortical hypodensity in the right kidney which is too small to characterize image 2/23.  This likely also represents cyst. Bladder is grossly within normal limits, but evaluation is limited secondary to streak artifact in the pelvis. Stomach/Bowel: No evidence for bowel obstruction, free air or pneumatosis. There is a large amount of stool throughout the colon. The appendix is not seen. Small bowel loops are within normal limits. Pancreatic mass abuts the body of the stomach with loss of fat plane. The stomach is otherwise normal. There is a moderate-sized hiatal hernia. Vascular/Lymphatic: Aorta and IVC are normal in size. There are atherosclerotic calcifications of the aorta. No discrete enlarged lymph nodes are identified. Reproductive: Uterus and bilateral adnexa are unremarkable. Other: There is a small amount of free fluid in the pelvis. There is a small fat containing umbilical hernia. Musculoskeletal: Degenerative changes affect the spine. No acute fractures are seen. No focal osseous lesions are seen. Right hip arthroplasty is present. Review of the MIP images confirms the above findings. IMPRESSION: 1. Segmental and subsegmental right lower lobe pulmonary emboli. Subsegmental right middle lobe pulmonary emboli. (RV/LV Ratio = 1.0) 2. Trace right pleural effusion. 3. Minimal patchy airspace disease and ground-glass opacities in the right lower lobe suspicious for infection. 4. T10 compression fracture is new from May 21, 2021. 5. Large mass involving the pancreatic body and tail most compatible with primary pancreatic neoplasm. Questionable ductal dilatation in the head of the pancreas versus second smaller lesion. 6. 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 7. 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 8. Likely small splenic infarct. 9. There are 2 suspicious lesions in the left lobe of the liver, likely metastatic disease. 10. Nonobstructing right renal calculi. 11. Small amount of  free fluid in the pelvis. 12. Large stool burden. 13.  Aortic Atherosclerosis (ICD10-I70.0). These results were called by telephone at the time of interpretation on 06/04/2021 at 7:28 pm to provider Wynona Dove , who verbally acknowledged these results. Electronically Signed   By: Tina Griffiths.D.  On: 06/04/2021 19:32   CT ABDOMEN PELVIS W CONTRAST  Result Date: 06/04/2021 CLINICAL DATA:  Epigastric pain.  Pancreatic malignancy. EXAM: CT ANGIOGRAPHY CHEST CT ABDOMEN AND PELVIS WITH CONTRAST TECHNIQUE: Multidetector CT imaging of the chest was performed using the standard protocol during bolus administration of intravenous contrast. Multiplanar CT image reconstructions and MIPs were obtained to evaluate the vascular anatomy. Multidetector CT imaging of the abdomen and pelvis was performed using the standard protocol during bolus administration of intravenous contrast. CONTRAST:  75mL OMNIPAQUE IOHEXOL 350 MG/ML SOLN COMPARISON:  CT renal stone 05/21/2021. FINDINGS: CTA CHEST FINDINGS Cardiovascular: The heart and aorta are normal in size. There are atherosclerotic calcifications of the aorta. There is no pericardial effusion. There is adequate opacification of the pulmonary arteries to the segmental level. There are segmental and subsegmental right lower lobe pulmonary emboli. There are subsegmental right middle lobe pulmonary emboli. Mediastinum/Nodes: There is a moderate hiatal hernia. The esophagus is nondilated. There are no enlarged mediastinal or hilar lymph nodes. Lungs/Pleura: There is a trace right pleural effusion. There is bilateral lower lung atelectasis. There is a small amount of patchy airspace and ground-glass opacity in the right lower lobe. Trachea and central airways are patent. No pneumothorax. No suspicious pulmonary nodules. Musculoskeletal: No chest wall abnormality. No acute or significant osseous findings. There is moderate compression fracture of T10 which is new from the prior  examination. No axillary lymphadenopathy. Review of the MIP images confirms the above findings. CT ABDOMEN and PELVIS FINDINGS Hepatobiliary: There is a 15 mm hypodense lesion in the left lobe of the liver image 2/19. There is a similar appearing hypodense lesion measuring 2 cm in the left lobe of the liver image 2/32. Small left hepatic cyst in the left lobe measures 9 mm. No gallstones are identified. Gallbladder sludge is likely present. No definite biliary ductal dilatation. Pancreas: Lobulated low-density mass in the region of the body and tail the pancreas measures 5.8 x 11.4 x 5.5 cm. Low-attenuation lesion in the head of the pancreas versus dilated duct seen on image 2/26 measuring 15 mm. There is loss of fat plane between the lesser curvature of the stomach. This mass encompasses the celiac axis and its proximal branches, possibly causing occlusion. Superior mesenteric vein not definitely involved. 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 Splenic vein not visualized and may be occluded as well. Spleen: There is small wedge-shaped area of hypodensity in the spleen suspicious for infarct. No perisplenic fluid collection identified. The spleen is nonenlarged. Adrenals/Urinary Tract: The bilateral adrenal glands are within normal limits. There are punctate nonobstructing right renal calculi. There is no hydronephrosis in either kidney. There is a 5.2 by 3.8 cm cyst in the right kidney. There is an additional cortical hypodensity in the right kidney which is too small to characterize image 2/23. This likely also represents cyst. Bladder is grossly within normal limits, but evaluation is limited secondary to streak artifact in the pelvis. Stomach/Bowel: No evidence for bowel obstruction, free air or pneumatosis. There is a large amount of stool throughout the colon. The appendix is not seen. Small bowel loops are within normal limits. Pancreatic  mass abuts the body of the stomach with loss of fat plane. The stomach is otherwise normal. There is a moderate-sized hiatal hernia. Vascular/Lymphatic: Aorta and IVC are normal in size. There are atherosclerotic calcifications of the aorta. No discrete enlarged lymph nodes are identified. Reproductive: Uterus and bilateral  adnexa are unremarkable. Other: There is a small amount of free fluid in the pelvis. There is a small fat containing umbilical hernia. Musculoskeletal: Degenerative changes affect the spine. No acute fractures are seen. No focal osseous lesions are seen. Right hip arthroplasty is present. Review of the MIP images confirms the above findings. IMPRESSION: 1. Segmental and subsegmental right lower lobe pulmonary emboli. Subsegmental right middle lobe pulmonary emboli. (RV/LV Ratio = 1.0) 2. Trace right pleural effusion. 3. Minimal patchy airspace disease and ground-glass opacities in the right lower lobe suspicious for infection. 4. T10 compression fracture is new from May 21, 2021. 5. Large mass involving the pancreatic body and tail most compatible with primary pancreatic neoplasm. Questionable ductal dilatation in the head of the pancreas versus second smaller lesion. 6. 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 7. 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 8. Likely small splenic infarct. 9. There are 2 suspicious lesions in the left lobe of the liver, likely metastatic disease. 10. Nonobstructing right renal calculi. 11. Small amount of free fluid in the pelvis. 12. Large stool burden. 13.  Aortic Atherosclerosis (ICD10-I70.0). These results were called by telephone at the time of interpretation on 06/04/2021 at 7:28 pm to provider Wynona Dove , who verbally acknowledged these results. Electronically Signed   By: Ronney Asters M.D.   On: 06/04/2021 19:32   CT T-SPINE NO CHARGE  Result  Date: 06/04/2021 CLINICAL DATA:  Initial evaluation for acute trauma, fall. EXAM: CT Thoracic and Lumbar spine  Contrast TECHNIQUE: Multiplanar CT images of the thoracic and lumbar spine were reconstructed from contemporary CT of the Chest, Abdomen, and Pelvis CONTRAST:  None or No additional COMPARISON:  Comparison made with concomitant CT of the chest, abdomen, and pelvis. FINDINGS: CT THORACIC SPINE FINDINGS Alignment: Exaggeration of the normal thoracic kyphosis. No listhesis. Vertebrae: There is an acute compression fracture involving the T10 vertebral body. Associated height loss measures up to 50% with 3 mm bony retropulsion. No significant stenosis. Otherwise, vertebral body height maintained with no other acute or chronic fracture. Visualized ribs intact. No worrisome osseous lesions. Paraspinal and other soft tissues: Paraspinous soft tissues demonstrate no acute finding. Small layering right pleural effusion with associated atelectasis. Previously identified pancreatic neoplasm partially visualize, better evaluated on concomitant CT of the abdomen and pelvis. Hiatal hernia noted. Scattered aortic atherosclerosis. Acute pulmonary embolism within right lower lobe segmental branches, also better evaluated on concomitant chest CT. Disc levels: No significant disc pathology or stenosis seen within the thoracic spine. CT LUMBAR SPINE FINDINGS Segmentation: Standard. Lowest well-formed disc space labeled the L5-S1 level. Alignment: Moderate levoscoliosis with apex at L2-3. Alignment otherwise normal preservation of the normal lumbar lordosis. Vertebrae: Vertebral body height maintained without acute or chronic fracture. Visualized sacrum and pelvis intact. No worrisome osseous lesions. Paraspinal and other soft tissues: Paraspinous soft tissues demonstrate no acute finding. 5.4 cm parapelvic cyst noted within the right kidney. Nonobstructive right renal nephrolithiasis noted. Aorto bi-iliac atherosclerotic  disease. Disc levels: Mild multilevel degenerative spondylosis, most pronounced at L1-2, L4-5, and L5-S1. Lower lumbar facet hypertrophy. No significant spinal stenosis. Moderate bilateral L5 foraminal narrowing. IMPRESSION: CT THORACIC SPINE IMPRESSION: 1. Compression fracture involving the T10 vertebral body with up to 50% height loss and 3 mm bony retropulsion, new as compared to 05/21/2021, consistent with an acute/subacute finding. No significant stenosis. 2. No other acute traumatic injury within the thoracic spine. 3. Acute  right-sided pulmonary emboli, better evaluated on concomitant CT of the chest. 4. Small layering right pleural effusion with associated atelectasis. CT LUMBAR SPINE IMPRESSION: 1. No acute traumatic injury within the lumbar spine. 2. Moderate levoscoliosis with apex at L2-3. 3. Mild multilevel degenerative spondylosis, most pronounced at L1-2, L4-5, and L5-S1. Moderate bilateral L5 foraminal narrowing. 4. Nonobstructive right renal nephrolithiasis. 5. Aortic Atherosclerosis (ICD10-I70.0). Results regarding the acute pulmonary emboli were communicated to the ordering clinician by Dr. Ronney Asters, documented on corresponding CTA of the chest. Electronically Signed   By: Jeannine Boga M.D.   On: 06/04/2021 19:45   CT L-SPINE NO CHARGE  Result Date: 06/04/2021 CLINICAL DATA:  Initial evaluation for acute trauma, fall. EXAM: CT Thoracic and Lumbar spine  Contrast TECHNIQUE: Multiplanar CT images of the thoracic and lumbar spine were reconstructed from contemporary CT of the Chest, Abdomen, and Pelvis CONTRAST:  None or No additional COMPARISON:  Comparison made with concomitant CT of the chest, abdomen, and pelvis. FINDINGS: CT THORACIC SPINE FINDINGS Alignment: Exaggeration of the normal thoracic kyphosis. No listhesis. Vertebrae: There is an acute compression fracture involving the T10 vertebral body. Associated height loss measures up to 50% with 3 mm bony retropulsion. No  significant stenosis. Otherwise, vertebral body height maintained with no other acute or chronic fracture. Visualized ribs intact. No worrisome osseous lesions. Paraspinal and other soft tissues: Paraspinous soft tissues demonstrate no acute finding. Small layering right pleural effusion with associated atelectasis. Previously identified pancreatic neoplasm partially visualize, better evaluated on concomitant CT of the abdomen and pelvis. Hiatal hernia noted. Scattered aortic atherosclerosis. Acute pulmonary embolism within right lower lobe segmental branches, also better evaluated on concomitant chest CT. Disc levels: No significant disc pathology or stenosis seen within the thoracic spine. CT LUMBAR SPINE FINDINGS Segmentation: Standard. Lowest well-formed disc space labeled the L5-S1 level. Alignment: Moderate levoscoliosis with apex at L2-3. Alignment otherwise normal preservation of the normal lumbar lordosis. Vertebrae: Vertebral body height maintained without acute or chronic fracture. Visualized sacrum and pelvis intact. No worrisome osseous lesions. Paraspinal and other soft tissues: Paraspinous soft tissues demonstrate no acute finding. 5.4 cm parapelvic cyst noted within the right kidney. Nonobstructive right renal nephrolithiasis noted. Aorto bi-iliac atherosclerotic disease. Disc levels: Mild multilevel degenerative spondylosis, most pronounced at L1-2, L4-5, and L5-S1. Lower lumbar facet hypertrophy. No significant spinal stenosis. Moderate bilateral L5 foraminal narrowing. IMPRESSION: CT THORACIC SPINE IMPRESSION: 1. Compression fracture involving the T10 vertebral body with up to 50% height loss and 3 mm bony retropulsion, new as compared to 05/21/2021, consistent with an acute/subacute finding. No significant stenosis. 2. No other acute traumatic injury within the thoracic spine. 3. Acute right-sided pulmonary emboli, better evaluated on concomitant CT of the chest. 4. Small layering right pleural  effusion with associated atelectasis. CT LUMBAR SPINE IMPRESSION: 1. No acute traumatic injury within the lumbar spine. 2. Moderate levoscoliosis with apex at L2-3. 3. Mild multilevel degenerative spondylosis, most pronounced at L1-2, L4-5, and L5-S1. Moderate bilateral L5 foraminal narrowing. 4. Nonobstructive right renal nephrolithiasis. 5. Aortic Atherosclerosis (ICD10-I70.0). Results regarding the acute pulmonary emboli were communicated to the ordering clinician by Dr. Ronney Asters, documented on corresponding CTA of the chest. Electronically Signed   By: Jeannine Boga M.D.   On: 06/04/2021 19:45   DG Chest Port 1 View  Result Date: 06/04/2021 CLINICAL DATA:  Shortness of breath.  Back pain. EXAM: PORTABLE CHEST 1 VIEW COMPARISON:  Two-view chest x-ray 01/22/2016 FINDINGS: Heart size upper limits of normal, exaggerated by  low lung volumes. Atherosclerotic changes are present in the arch. Mild atelectasis or scarring is present both bases. No edema or effusion is present airspace consolidation is present. Bony thorax is within normal limits. IMPRESSION: 1. Low lung volumes. 2. Mild bibasilar atelectasis or scarring. 3. Atherosclerosis. Electronically Signed   By: San Morelle M.D.   On: 06/04/2021 17:44    EKG: I independently viewed the EKG done and my findings are as followed: Normal sinus rhythm at a rate of 75 bpm  Assessment/Plan Present on Admission:  Chronic back pain  Acute pulmonary embolism (HCC)  Principal Problem:   Pulmonary embolism ([place]) Active Problems:   Essential hypertension   Chronic back pain   Spondylosis of lumbar spine   Pleural effusion on right   Atelectasis   Pancreatic cancer (HCC)   Portal vein thrombosis   Liver lesion, left lobe   Splenic infarct   Acute respiratory failure with hypoxia (HCC)   Elevated d-dimer   Hypercalcemia   Elevated troponin   Elevated brain natriuretic peptide (BNP) level   Hyperglycemia   Acute pulmonary embolism  (HCC)  Acute respiratory failure with hypoxia secondary to pulmonary embolism Elevated D-dimer (10.85) Patient presented with shortness of breath CT angiography chest with contrast showed segmental and subsegmental right lower lobe pulmonary emboli. 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 Vascular surgeon at [facility] was consulted and states that there is no indication for patient to be admitted to [facility] at this time especially since patient has an ongoing PE per ED physician. Consider outpatient vascular surgery follow-up  Compression fracture/chronic back pain CT thoracic spine showed compression fracture involving the T10 vertebral body with up to 50% height loss and 3 mm bony retropulsion, new as compared to 05/21/2021, consistent with an acute/subacute finding. Continue TLSO brace Continue Tylenol for mild pain, oxycodone for moderate/severe pain. Continue fall precaution neurochecks Continue PT/OT eval and treat  Spondylosis of lumbar spine CT thoracolumbar showed no acute traumatic injury within the lumbar spine but showed Moderate levoscoliosis with apex at L2-3.  Mild multilevel degenerative spondylosis, most pronounced at L1-2, L4-5, and L5-S1. Moderate bilateral L5 foraminal narrowing Continue TLSO brace Continue Tylenol for mild pain, oxycodone for  moderate/severe pain. Continue fall precaution neurochecks Continue PT/OT eval and treat  Questionable lung infection CT angiography of chest showed minimal patchy airspace disease and groundglass opacities in the right lower lobe suspicious for infection Patient denies fever, chills, night cough, chest congestion.  No elevated WBC Procalcitonin will be checked prior to starting antibiotics  Pancreatic cancer Patient has a pending hematology/oncology follow-up on Thursday (1/5) per ED medical record  Left lobe liver lesion CT abdomen and pelvis showed 2 suspicious lesions in the left lobe of the liver, likely metastatic disease. Patient has a pending hematology/oncology follow-up  Hypercalcemia possibly related to patient's history of pancreatic cancer Continue IV hydration  Elevated BNP BNP 151 Continue total input/output,  daily weights and fluid restriction Continue Cardiac diet  Echocardiogram in the morning   Elevated troponin possibly secondary to type II demand ischemia Troponin x2- 59 > 60, continue to trend troponin.  Patient denies any chest pain  Hyperglycemia possibly reactive Continue to monitor blood glucose level  Atelectasis Continue incentive telemetry  DVT prophylaxis: Heparin drip  Code Status: Full code  Family Communication: None at bedside  Disposition Plan:  Patient is from:                        home Anticipated DC to:                   SNF or family members home Anticipated DC date:               2-3 days Anticipated DC barriers:          Patient requires inpatient management due to pulmonary embolism with need for supplemental oxygen and worsening back pain in the setting of pancreatic cancer  Consults called: None  Admission status: Inpatient    Bernadette Hoit MD [facility]  06/04/2021, 9:12 PM

## 2021-06-04 NOTE — Progress Notes (Signed)
ANTICOAGULATION CONSULT NOTE - Initial Consult  Pharmacy Consult for Heparin Indication: r/o pulmonary embolus  Allergies  Allergen Reactions   Celebrex [Celecoxib]     rash   Demerol [Meperidine] Hypertension   Lisinopril     Cough   Statins     Muscle aches   Zyrtec [Cetirizine] Itching    Patient Measurements: Height: 5\' 1"  (154.9 cm) Weight: 63.2 kg (139 lb 6.4 oz) IBW/kg (Calculated) : 47.8 Heparin Dosing Weight: 63 kg  Vital Signs: Temp: 98.3 F (36.8 C) (01/03 1650) Temp Source: Oral (01/03 1650) BP: 148/68 (01/03 1757) Pulse Rate: 74 (01/03 1800)  Labs: Recent Labs    06/04/21 1645  HGB 9.2*  HCT 28.9*  PLT 170  CREATININE 0.89  TROPONINIHS 59*    Estimated Creatinine Clearance: 43 mL/min (by C-G formula based on SCr of 0.89 mg/dL).   Medical History: Past Medical History:  Diagnosis Date   Allergy    Arthritis    "back, right shoulder, hips" (10/14/2012)   Cataract    Complication of anesthesia    "1950's had appendix out; kicked the RN; no problems since" (10/14/2012)   Heart murmur    normal Echo 2014 per pt   History of migraine    1960s   Hyperlipidemia    Hypertension    Osteopenia    Pneumonia    "couple of times; last time in the mid 1990's" (10/14/2012)   TIA (transient ischemic attack) 10/14/2012    Medications:  Awaiting electronic med rec  Assessment: 81 y.o. F presents with back pain. Sats in the 60s when admitted. Heparin to start for r/o PE. D-dimer 10.85. No AC PTA. Hgb 9.2 on admission.  Goal of Therapy:  Heparin level 0.3-0.7 units/ml Monitor platelets by anticoagulation protocol: Yes   Plan:  Heparin IV bolus 4000 units Heparin gtt at 1000 units/hr Will f/u heparin level in 8 hours Daily heparin level and CBC F/u diagnostic studies   Sherlon Handing, PharmD, BCPS Please see amion for complete clinical pharmacist phone list 06/04/2021,7:30 PM

## 2021-06-04 NOTE — ED Triage Notes (Signed)
Pt to the ED with [facility] with complaints of back pain.  Pt's family took her to the fire dept for shortness of breath and oxygen saturations were found to be 60 percent. Pt was placed on a NRB and rose to 100 percent.  Pt does not use oxygen at home and is now on 2L [place] for comfort per EMS.   Pt states her back pain has been going on for the past 3 weeks.

## 2021-06-04 NOTE — ED Provider Notes (Signed)
[facility] EMERGENCY DEPARTMENT Provider Note   CSN: [id number] Arrival date & time: 06/04/21  1637     History  Chief Complaint  Patient presents with   Back Pain   Shortness of Breath    Andrea Moreno is a 81 y.o. female.  This is a 81 y.o. female with significant medical history as below, including chronic back pain, GAD who presents to the ED with complaint of back pain.  Patient reports is recent diagnosed pancreatic cancer, she is scheduled to see hematology/oncology on Thursday.  She has not started treatment yet.  She has been put on oral analgesics for her back pain.  Patient reports pain is progressively worsened over the past 3 months, worsened in the past week.  Pain is roughly mid thoracic, radiating to her ribs right greater than left.  No nausea or vomiting.  No numbness or tingling, no saddle paresthesias, no change to urine output, no urine incontinence.  Decreased activity over the past few weeks secondary to worsening pain.  Pain not significant improved with home oxycodone.  Does report some mild constipation over the past few days.  When EMS arrived at patient's house she was hypoxic in the 80s.  No home oxygen use.  She was placed on nasal cannula with improvement to oxygen saturation.  Still have some pain with breathing, pleuritic chest pain.  No fevers, chills, cough, congestion.  The history is provided by the patient and a relative. No language interpreter was used.  Back Pain Associated symptoms: no abdominal pain, no chest pain, no dysuria, no fever and no headaches   Shortness of Breath Associated symptoms: no abdominal pain, no chest pain, no cough, no fever, no headaches and no rash      Patient Active Problem List   Diagnosis Date Noted   Pulmonary embolism ([place]) 06/04/2021   Spondylosis of lumbar spine 06/04/2021   Pleural effusion on right 06/04/2021   Atelectasis 06/04/2021   Pancreatic cancer ([place]) 06/04/2021   Portal vein thrombosis  06/04/2021   Liver lesion, left lobe 06/04/2021   Splenic infarct 06/04/2021   Acute respiratory failure with hypoxia ([place]) 06/04/2021   Elevated d-dimer 06/04/2021   Hypercalcemia 06/04/2021   Elevated troponin 06/04/2021   Elevated brain natriuretic peptide (BNP) level 06/04/2021   Hyperglycemia 06/04/2021   Acute pulmonary embolism ([place]) 06/04/2021   Chronic back pain 08/12/2018   Overweight (BMI 25.0-29.9) 07/21/2016   Insomnia 07/21/2016   Primary osteoarthritis of right hip 01/28/2016   Aortic atherosclerosis ([place]) 01/22/2016   Osteoarthritis 07/20/2015   GAD (generalized anxiety disorder) 01/16/2015   Vitamin D deficiency 01/16/2014   Osteopenia 01/21/2013   History of TIA (transient ischemic attack) 10/14/2012   Hyperlipidemia 10/14/2012   Essential hypertension 10/14/2012   Leukopenia 10/14/2012     Home Medications Prior to Admission medications   Medication Sig Start Date End Date Taking? Authorizing Provider  acetaminophen (TYLENOL) 650 MG CR tablet Take 650 mg by mouth at bedtime.   Yes [provider]  aspirin 325 MG EC tablet Take 325 mg by mouth daily.   Yes [provider]  doxepin (SINEQUAN) 25 MG capsule TAKE 1 CAPSULE AT BEDTIME 03/11/21  Yes Hawks, Theador Hawthorne, FNP  linaclotide (LINZESS) 290 MCG CAPS capsule Take 1 capsule (290 mcg total) by mouth daily before breakfast. 05/28/21  Yes Hawks, Christy A, FNP  losartan (COZAAR) 100 MG tablet TAKE 1 TABLET DAILY 04/07/21  Yes Hawks, Alyse Low A, FNP  megestrol (MEGACE) 400 MG/10ML  suspension Take 10 mLs (400 mg total) by mouth 2 (two) times daily. For appetite stimulation 05/06/21  Yes Stacks, Cletus Gash, MD  ondansetron (ZOFRAN) 4 MG tablet Take 1 tablet (4 mg total) by mouth every 8 (eight) hours as needed for nausea or vomiting. 05/28/21  Yes Hawks, Christy A, FNP  OVER THE COUNTER MEDICATION Vitamin d3   Yes [provider]  oxycodone (OXY-IR) 5 MG capsule Take 1 capsule (5 mg total) by mouth  every 4 (four) hours as needed. 06/04/21  Yes Hawks, Christy A, FNP  pantoprazole (PROTONIX) 40 MG tablet Take 1 tablet (40 mg total) by mouth daily. For stomach 05/06/21  Yes Stacks, Cletus Gash, MD  Potassium Gluconate 550 (90 K) MG TABS Take 550 mg by mouth daily.   Yes [provider]  raloxifene (EVISTA) 60 MG tablet TAKE 1 TABLET DAILY 04/07/21  Yes Hawks, Christy A, FNP  Calcium Citrate (CITRACAL PO) Take 600 mg by mouth 2 (two) times daily. Patient not taking: Reported on 06/04/2021    [provider]  Collagen-Vitamin C-Biotin (COLLAGEN 1500/C PO) Take by mouth. Patient not taking: Reported on 06/04/2021    [provider]  Multiple Vitamin (MULTIVITAMIN) capsule Take 1 capsule by mouth daily. Patient not taking: Reported on 06/04/2021    [provider]  niacin 500 MG tablet Take 500 mg by mouth at bedtime. Patient not taking: Reported on 06/04/2021    [provider]  polyethylene glycol powder (GLYCOLAX/MIRALAX) 17 GM/SCOOP powder Take 17 g by mouth 2 (two) times daily as needed. Patient not taking: Reported on 06/04/2021 12/12/19   Evelina Dun A, FNP      Allergies    Celebrex [celecoxib], Demerol [meperidine], Lisinopril, Statins, and Zyrtec [cetirizine]    Review of Systems   Review of Systems  Constitutional:  Positive for fatigue. Negative for activity change and fever.  HENT:  Negative for facial swelling and trouble swallowing.   Eyes:  Negative for discharge and redness.  Respiratory:  Positive for shortness of breath. Negative for cough.   Cardiovascular:  Negative for chest pain and palpitations.  Gastrointestinal:  Negative for abdominal pain and nausea.  Genitourinary:  Negative for dysuria and flank pain.  Musculoskeletal:  Positive for arthralgias and back pain. Negative for gait problem.  Skin:  Negative for pallor and rash.  Neurological:  Negative for syncope and headaches.   Physical Exam Updated Vital Signs BP (!) 124/46     Pulse 71    Temp 98.1 F (36.7 C) (Oral)    Resp 20    Ht 5\' 2"  (1.575 m)    Wt 61.3 kg    SpO2 98%    BMI 24.72 kg/m  Physical Exam Vitals and nursing note reviewed.  Constitutional:      General: She is not in acute distress.    Appearance: Normal appearance. She is well-developed.  HENT:     Head: Normocephalic and atraumatic.     Right Ear: External ear normal.     Left Ear: External ear normal.     Nose: Nose normal.     Mouth/Throat:     Mouth: Mucous membranes are moist.  Eyes:     General: No scleral icterus.       Right eye: No discharge.        Left eye: No discharge.  Cardiovascular:     Rate and Rhythm: Normal rate and regular rhythm.     Pulses: Normal pulses.     Heart  sounds: Normal heart sounds.  Pulmonary:     Effort: Pulmonary effort is normal. No respiratory distress.     Breath sounds: Normal breath sounds.  Abdominal:     General: Abdomen is flat.     Tenderness: There is no abdominal tenderness.  Musculoskeletal:        General: Normal range of motion.       Arms:     Cervical back: Normal range of motion.     Right lower leg: No edema.     Left lower leg: No edema.     Comments: Tenderness on palpation.  Skin:    General: Skin is warm and dry.     Capillary Refill: Capillary refill takes less than 2 seconds.  Neurological:     General: No focal deficit present.     Mental Status: She is alert and oriented to person, place, and time.     Cranial Nerves: Cranial nerves 2-12 are intact.     Sensory: Sensation is intact.     Motor: Motor function is intact.  Psychiatric:        Mood and Affect: Mood normal.        Behavior: Behavior normal.    ED Results / Procedures / Treatments   Labs (all labs ordered are listed, but only abnormal results are displayed) Labs Reviewed  COMPREHENSIVE METABOLIC PANEL - Abnormal; Notable for the following components:      Result Value   Sodium 134 (*)    Glucose, Bld 106 (*)    BUN 25 (*)    Calcium 12.6  (*)    Albumin 3.3 (*)    AST 42 (*)    ALT 46 (*)    All other components within normal limits  CBC WITH DIFFERENTIAL/PLATELET - Abnormal; Notable for the following components:   RBC 3.31 (*)    Hemoglobin 9.2 (*)    HCT 28.9 (*)    All other components within normal limits  D-DIMER, QUANTITATIVE - Abnormal; Notable for the following components:   D-Dimer, Quant 10.85 (*)    All other components within normal limits  BRAIN NATRIURETIC PEPTIDE - Abnormal; Notable for the following components:   B Natriuretic Peptide 151.0 (*)    All other components within normal limits  BLOOD GAS, VENOUS - Abnormal; Notable for the following components:   pCO2, Ven 40.1 (*)    All other components within normal limits  TROPONIN I (HIGH SENSITIVITY) - Abnormal; Notable for the following components:   Troponin I (High Sensitivity) 59 (*)    All other components within normal limits  TROPONIN I (HIGH SENSITIVITY) - Abnormal; Notable for the following components:   Troponin I (High Sensitivity) 60 (*)    All other components within normal limits  TROPONIN I (HIGH SENSITIVITY) - Abnormal; Notable for the following components:   Troponin I (High Sensitivity) 52 (*)    All other components within normal limits  RESP PANEL BY RT-PCR (FLU A&B, COVID) ARPGX2  HEPARIN LEVEL (UNFRACTIONATED)  CBC  PROCALCITONIN  COMPREHENSIVE METABOLIC PANEL  APTT  MAGNESIUM  PHOSPHORUS  POC SARS CORONAVIRUS 2 AG -  ED  TROPONIN I (HIGH SENSITIVITY)    EKG None  Radiology CT Angio Chest PE W and/or Wo Contrast  Result Date: 06/04/2021 CLINICAL DATA:  Epigastric pain.  Pancreatic malignancy. EXAM: CT ANGIOGRAPHY CHEST CT ABDOMEN AND PELVIS WITH CONTRAST TECHNIQUE: Multidetector CT imaging of the chest was performed using the standard protocol during bolus administration of intravenous  contrast. Multiplanar CT image reconstructions and MIPs were obtained to evaluate the vascular anatomy. Multidetector CT imaging of  the abdomen and pelvis was performed using the standard protocol during bolus administration of intravenous contrast. CONTRAST:  66mL OMNIPAQUE IOHEXOL 350 MG/ML SOLN COMPARISON:  CT renal stone 05/21/2021. FINDINGS: CTA CHEST FINDINGS Cardiovascular: The heart and aorta are normal in size. There are atherosclerotic calcifications of the aorta. There is no pericardial effusion. There is adequate opacification of the pulmonary arteries to the segmental level. There are segmental and subsegmental right lower lobe pulmonary emboli. There are subsegmental right middle lobe pulmonary emboli. Mediastinum/Nodes: There is a moderate hiatal hernia. The esophagus is nondilated. There are no enlarged mediastinal or hilar lymph nodes. Lungs/Pleura: There is a trace right pleural effusion. There is bilateral lower lung atelectasis. There is a small amount of patchy airspace and ground-glass opacity in the right lower lobe. Trachea and central airways are patent. No pneumothorax. No suspicious pulmonary nodules. Musculoskeletal: No chest wall abnormality. No acute or significant osseous findings. There is moderate compression fracture of T10 which is new from the prior examination. No axillary lymphadenopathy. Review of the MIP images confirms the above findings. CT ABDOMEN and PELVIS FINDINGS Hepatobiliary: There is a 15 mm hypodense lesion in the left lobe of the liver image 2/19. There is a similar appearing hypodense lesion measuring 2 cm in the left lobe of the liver image 2/32. Small left hepatic cyst in the left lobe measures 9 mm. No gallstones are identified. Gallbladder sludge is likely present. No definite biliary ductal dilatation. Pancreas: Lobulated low-density mass in the region of the body and tail the pancreas measures 5.8 x 11.4 x 5.5 cm. Low-attenuation lesion in the head of the pancreas versus dilated duct seen on image 2/26 measuring 15 mm. There is loss of fat plane between the lesser curvature of the  stomach. This mass encompasses the celiac axis and its proximal branches, possibly causing occlusion. Superior mesenteric vein not definitely involved. 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 Splenic vein not visualized and may be occluded as well. Spleen: There is small wedge-shaped area of hypodensity in the spleen suspicious for infarct. No perisplenic fluid collection identified. The spleen is nonenlarged. Adrenals/Urinary Tract: The bilateral adrenal glands are within normal limits. There are punctate nonobstructing right renal calculi. There is no hydronephrosis in either kidney. There is a 5.2 by 3.8 cm cyst in the right kidney. There is an additional cortical hypodensity in the right kidney which is too small to characterize image 2/23. This likely also represents cyst. Bladder is grossly within normal limits, but evaluation is limited secondary to streak artifact in the pelvis. Stomach/Bowel: No evidence for bowel obstruction, free air or pneumatosis. There is a large amount of stool throughout the colon. The appendix is not seen. Small bowel loops are within normal limits. Pancreatic mass abuts the body of the stomach with loss of fat plane. The stomach is otherwise normal. There is a moderate-sized hiatal hernia. Vascular/Lymphatic: Aorta and IVC are normal in size. There are atherosclerotic calcifications of the aorta. No discrete enlarged lymph nodes are identified. Reproductive: Uterus and bilateral adnexa are unremarkable. Other: There is a small amount of free fluid in the pelvis. There is a small fat containing umbilical hernia. Musculoskeletal: Degenerative changes affect the spine. No acute fractures are seen. No focal osseous lesions are seen. Right hip arthroplasty is present. Review of the MIP images  confirms the above findings. IMPRESSION: 1. Segmental and subsegmental right lower lobe pulmonary emboli. Subsegmental right  middle lobe pulmonary emboli. (RV/LV Ratio = 1.0) 2. Trace right pleural effusion. 3. Minimal patchy airspace disease and ground-glass opacities in the right lower lobe suspicious for infection. 4. T10 compression fracture is new from May 21, 2021. 5. Large mass involving the pancreatic body and tail most compatible with primary pancreatic neoplasm. Questionable ductal dilatation in the head of the pancreas versus second smaller lesion. 6. 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 7. 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 8. Likely small splenic infarct. 9. There are 2 suspicious lesions in the left lobe of the liver, likely metastatic disease. 10. Nonobstructing right renal calculi. 11. Small amount of free fluid in the pelvis. 12. Large stool burden. 13.  Aortic Atherosclerosis (ICD10-I70.0). These results were called by telephone at the time of interpretation on 06/04/2021 at 7:28 pm to provider Wynona Dove , who verbally acknowledged these results. Electronically Signed   By: Ronney Asters M.D.   On: 06/04/2021 19:32   CT ABDOMEN PELVIS W CONTRAST  Result Date: 06/04/2021 CLINICAL DATA:  Epigastric pain.  Pancreatic malignancy. EXAM: CT ANGIOGRAPHY CHEST CT ABDOMEN AND PELVIS WITH CONTRAST TECHNIQUE: Multidetector CT imaging of the chest was performed using the standard protocol during bolus administration of intravenous contrast. Multiplanar CT image reconstructions and MIPs were obtained to evaluate the vascular anatomy. Multidetector CT imaging of the abdomen and pelvis was performed using the standard protocol during bolus administration of intravenous contrast. CONTRAST:  47mL OMNIPAQUE IOHEXOL 350 MG/ML SOLN COMPARISON:  CT renal stone 05/21/2021. FINDINGS: CTA CHEST FINDINGS Cardiovascular: The heart and aorta are normal in size. There are atherosclerotic calcifications of the aorta. There is  no pericardial effusion. There is adequate opacification of the pulmonary arteries to the segmental level. There are segmental and subsegmental right lower lobe pulmonary emboli. There are subsegmental right middle lobe pulmonary emboli. Mediastinum/Nodes: There is a moderate hiatal hernia. The esophagus is nondilated. There are no enlarged mediastinal or hilar lymph nodes. Lungs/Pleura: There is a trace right pleural effusion. There is bilateral lower lung atelectasis. There is a small amount of patchy airspace and ground-glass opacity in the right lower lobe. Trachea and central airways are patent. No pneumothorax. No suspicious pulmonary nodules. Musculoskeletal: No chest wall abnormality. No acute or significant osseous findings. There is moderate compression fracture of T10 which is new from the prior examination. No axillary lymphadenopathy. Review of the MIP images confirms the above findings. CT ABDOMEN and PELVIS FINDINGS Hepatobiliary: There is a 15 mm hypodense lesion in the left lobe of the liver image 2/19. There is a similar appearing hypodense lesion measuring 2 cm in the left lobe of the liver image 2/32. Small left hepatic cyst in the left lobe measures 9 mm. No gallstones are identified. Gallbladder sludge is likely present. No definite biliary ductal dilatation. Pancreas: Lobulated low-density mass in the region of the body and tail the pancreas measures 5.8 x 11.4 x 5.5 cm. Low-attenuation lesion in the head of the pancreas versus dilated duct seen on image 2/26 measuring 15 mm. There is loss of fat plane between the lesser curvature of the stomach. This mass encompasses the celiac axis and its proximal branches, possibly causing occlusion. Superior mesenteric vein not definitely involved. There is thrombosis of the main portal vein and portal confluence as well as distal superior  mesenteric vein. Splenic vein not visualized and may be occluded as well. Spleen: There is small wedge-shaped area of  hypodensity in the spleen suspicious for infarct. No perisplenic fluid collection identified. The spleen is nonenlarged. Adrenals/Urinary Tract: The bilateral adrenal glands are within normal limits. There are punctate nonobstructing right renal calculi. There is no hydronephrosis in either kidney. There is a 5.2 by 3.8 cm cyst in the right kidney. There is an additional cortical hypodensity in the right kidney which is too small to characterize image 2/23. This likely also represents cyst. Bladder is grossly within normal limits, but evaluation is limited secondary to streak artifact in the pelvis. Stomach/Bowel: No evidence for bowel obstruction, free air or pneumatosis. There is a large amount of stool throughout the colon. The appendix is not seen. Small bowel loops are within normal limits. Pancreatic mass abuts the body of the stomach with loss of fat plane. The stomach is otherwise normal. There is a moderate-sized hiatal hernia. Vascular/Lymphatic: Aorta and IVC are normal in size. There are atherosclerotic calcifications of the aorta. No discrete enlarged lymph nodes are identified. Reproductive: Uterus and bilateral adnexa are unremarkable. Other: There is a small amount of free fluid in the pelvis. There is a small fat containing umbilical hernia. Musculoskeletal: Degenerative changes affect the spine. No acute fractures are seen. No focal osseous lesions are seen. Right hip arthroplasty is present. Review of the MIP images confirms the above findings. IMPRESSION: 1. Segmental and subsegmental right lower lobe pulmonary emboli. Subsegmental right middle lobe pulmonary emboli. (RV/LV Ratio = 1.0) 2. Trace right pleural effusion. 3. Minimal patchy airspace disease and ground-glass opacities in the right lower lobe suspicious for infection. 4. T10 compression fracture is new from May 21, 2021. 5. Large mass involving the pancreatic body and tail most compatible with primary pancreatic neoplasm.  Questionable ductal dilatation in the head of the pancreas versus second smaller lesion. 6. 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 7. 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 8. Likely small splenic infarct. 9. There are 2 suspicious lesions in the left lobe of the liver, likely metastatic disease. 10. Nonobstructing right renal calculi. 11. Small amount of free fluid in the pelvis. 12. Large stool burden. 13.  Aortic Atherosclerosis (ICD10-I70.0). These results were called by telephone at the time of interpretation on 06/04/2021 at 7:28 pm to provider Wynona Dove , who verbally acknowledged these results. Electronically Signed   By: Ronney Asters M.D.   On: 06/04/2021 19:32   CT T-SPINE NO CHARGE  Result Date: 06/04/2021 CLINICAL DATA:  Initial evaluation for acute trauma, fall. EXAM: CT Thoracic and Lumbar spine  Contrast TECHNIQUE: Multiplanar CT images of the thoracic and lumbar spine were reconstructed from contemporary CT of the Chest, Abdomen, and Pelvis CONTRAST:  None or No additional COMPARISON:  Comparison made with concomitant CT of the chest, abdomen, and pelvis. FINDINGS: CT THORACIC SPINE FINDINGS Alignment: Exaggeration of the normal thoracic kyphosis. No listhesis. Vertebrae: There is an acute compression fracture involving the T10 vertebral body. Associated height loss measures up to 50% with 3 mm bony retropulsion. No significant stenosis. Otherwise, vertebral body height maintained with no other acute or chronic fracture. Visualized ribs intact. No worrisome osseous lesions. Paraspinal and other soft tissues: Paraspinous soft tissues demonstrate no acute finding. Small layering right pleural effusion with associated atelectasis. Previously identified pancreatic neoplasm partially visualize, better evaluated on concomitant CT of  the abdomen and pelvis. Hiatal hernia noted. Scattered aortic  atherosclerosis. Acute pulmonary embolism within right lower lobe segmental branches, also better evaluated on concomitant chest CT. Disc levels: No significant disc pathology or stenosis seen within the thoracic spine. CT LUMBAR SPINE FINDINGS Segmentation: Standard. Lowest well-formed disc space labeled the L5-S1 level. Alignment: Moderate levoscoliosis with apex at L2-3. Alignment otherwise normal preservation of the normal lumbar lordosis. Vertebrae: Vertebral body height maintained without acute or chronic fracture. Visualized sacrum and pelvis intact. No worrisome osseous lesions. Paraspinal and other soft tissues: Paraspinous soft tissues demonstrate no acute finding. 5.4 cm parapelvic cyst noted within the right kidney. Nonobstructive right renal nephrolithiasis noted. Aorto bi-iliac atherosclerotic disease. Disc levels: Mild multilevel degenerative spondylosis, most pronounced at L1-2, L4-5, and L5-S1. Lower lumbar facet hypertrophy. No significant spinal stenosis. Moderate bilateral L5 foraminal narrowing. IMPRESSION: CT THORACIC SPINE IMPRESSION: 1. Compression fracture involving the T10 vertebral body with up to 50% height loss and 3 mm bony retropulsion, new as compared to 05/21/2021, consistent with an acute/subacute finding. No significant stenosis. 2. No other acute traumatic injury within the thoracic spine. 3. Acute right-sided pulmonary emboli, better evaluated on concomitant CT of the chest. 4. Small layering right pleural effusion with associated atelectasis. CT LUMBAR SPINE IMPRESSION: 1. No acute traumatic injury within the lumbar spine. 2. Moderate levoscoliosis with apex at L2-3. 3. Mild multilevel degenerative spondylosis, most pronounced at L1-2, L4-5, and L5-S1. Moderate bilateral L5 foraminal narrowing. 4. Nonobstructive right renal nephrolithiasis. 5. Aortic Atherosclerosis (ICD10-I70.0). Results regarding the acute pulmonary emboli were communicated to the ordering clinician by Dr. Ronney Asters, documented on corresponding CTA of the chest. Electronically Signed   By: Jeannine Boga M.D.   On: 06/04/2021 19:45   CT L-SPINE NO CHARGE  Result Date: 06/04/2021 CLINICAL DATA:  Initial evaluation for acute trauma, fall. EXAM: CT Thoracic and Lumbar spine  Contrast TECHNIQUE: Multiplanar CT images of the thoracic and lumbar spine were reconstructed from contemporary CT of the Chest, Abdomen, and Pelvis CONTRAST:  None or No additional COMPARISON:  Comparison made with concomitant CT of the chest, abdomen, and pelvis. FINDINGS: CT THORACIC SPINE FINDINGS Alignment: Exaggeration of the normal thoracic kyphosis. No listhesis. Vertebrae: There is an acute compression fracture involving the T10 vertebral body. Associated height loss measures up to 50% with 3 mm bony retropulsion. No significant stenosis. Otherwise, vertebral body height maintained with no other acute or chronic fracture. Visualized ribs intact. No worrisome osseous lesions. Paraspinal and other soft tissues: Paraspinous soft tissues demonstrate no acute finding. Small layering right pleural effusion with associated atelectasis. Previously identified pancreatic neoplasm partially visualize, better evaluated on concomitant CT of the abdomen and pelvis. Hiatal hernia noted. Scattered aortic atherosclerosis. Acute pulmonary embolism within right lower lobe segmental branches, also better evaluated on concomitant chest CT. Disc levels: No significant disc pathology or stenosis seen within the thoracic spine. CT LUMBAR SPINE FINDINGS Segmentation: Standard. Lowest well-formed disc space labeled the L5-S1 level. Alignment: Moderate levoscoliosis with apex at L2-3. Alignment otherwise normal preservation of the normal lumbar lordosis. Vertebrae: Vertebral body height maintained without acute or chronic fracture. Visualized sacrum and pelvis intact. No worrisome osseous lesions. Paraspinal and other soft tissues: Paraspinous soft tissues  demonstrate no acute finding. 5.4 cm parapelvic cyst noted within the right kidney. Nonobstructive right renal nephrolithiasis noted. Aorto bi-iliac atherosclerotic disease. Disc levels: Mild multilevel degenerative spondylosis, most pronounced at L1-2, L4-5, and L5-S1. Lower lumbar facet hypertrophy. No significant spinal stenosis. Moderate bilateral L5  foraminal narrowing. IMPRESSION: CT THORACIC SPINE IMPRESSION: 1. Compression fracture involving the T10 vertebral body with up to 50% height loss and 3 mm bony retropulsion, new as compared to 05/21/2021, consistent with an acute/subacute finding. No significant stenosis. 2. No other acute traumatic injury within the thoracic spine. 3. Acute right-sided pulmonary emboli, better evaluated on concomitant CT of the chest. 4. Small layering right pleural effusion with associated atelectasis. CT LUMBAR SPINE IMPRESSION: 1. No acute traumatic injury within the lumbar spine. 2. Moderate levoscoliosis with apex at L2-3. 3. Mild multilevel degenerative spondylosis, most pronounced at L1-2, L4-5, and L5-S1. Moderate bilateral L5 foraminal narrowing. 4. Nonobstructive right renal nephrolithiasis. 5. Aortic Atherosclerosis (ICD10-I70.0). Results regarding the acute pulmonary emboli were communicated to the ordering clinician by Dr. Ronney Asters, documented on corresponding CTA of the chest. Electronically Signed   By: Jeannine Boga M.D.   On: 06/04/2021 19:45   DG Chest Port 1 View  Result Date: 06/04/2021 CLINICAL DATA:  Shortness of breath.  Back pain. EXAM: PORTABLE CHEST 1 VIEW COMPARISON:  Two-view chest x-ray 01/22/2016 FINDINGS: Heart size upper limits of normal, exaggerated by low lung volumes. Atherosclerotic changes are present in the arch. Mild atelectasis or scarring is present both bases. No edema or effusion is present airspace consolidation is present. Bony thorax is within normal limits. IMPRESSION: 1. Low lung volumes. 2. Mild bibasilar atelectasis  or scarring. 3. Atherosclerosis. Electronically Signed   By: San Morelle M.D.   On: 06/04/2021 17:44    Procedures .Critical Care Performed by: Jeanell Sparrow, DO Authorized by: Jeanell Sparrow, DO   Critical care provider statement:    Critical care time (minutes):  45   Critical care time was exclusive of:  Separately billable procedures and treating other patients   Critical care was necessary to treat or prevent imminent or life-threatening deterioration of the following conditions:  Cardiac failure and respiratory failure   Critical care was time spent personally by me on the following activities:  Development of treatment plan with patient or surrogate, discussions with consultants, evaluation of patient's response to treatment, examination of patient, ordering and review of laboratory studies, ordering and review of radiographic studies, ordering and performing treatments and interventions, pulse oximetry, re-evaluation of patient's condition and review of old charts   Care discussed with: admitting provider   Comments:     PE, NSTEMI, heparin infusion, new hypoxia     Cardiac monitoring febrile myself demonstrates normal sinus rhythm Medications Ordered in ED Medications  aspirin tablet 325 mg (325 mg Oral Given 06/04/21 1922)  heparin ADULT infusion 100 units/mL (25000 units/288mL) (1,000 Units/hr Intravenous New Bag/Given 06/04/21 1959)  acetaminophen (TYLENOL) tablet 650 mg (has no administration in time range)    Or  acetaminophen (TYLENOL) suppository 650 mg (has no administration in time range)  methocarbamol (ROBAXIN) 500 mg in dextrose 5 % 50 mL IVPB (has no administration in time range)  HYDROmorphone (DILAUDID) injection 0.5 mg (has no administration in time range)  oxyCODONE (Oxy IR/ROXICODONE) immediate release tablet 5 mg (has no administration in time range)  HYDROmorphone (DILAUDID) injection 1 mg (1 mg Intravenous Given 06/04/21 1757)  ondansetron (ZOFRAN)  injection 4 mg (4 mg Intravenous Given 06/04/21 1757)  iohexol (OMNIPAQUE) 350 MG/ML injection 100 mL (80 mLs Intravenous Contrast Given 06/04/21 1907)  heparin bolus via infusion 4,000 Units (4,000 Units Intravenous Bolus from Bag 06/04/21 2000)  0.9 %  sodium chloride infusion ( Intravenous New Bag/Given 06/04/21 2335)  ED Course/ Medical Decision Making/ A&P Clinical Course as of 06/05/21 0045  Tue Jun 04, 2021  2007 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 Vital signs were reviewed. Serious etiologies considered.  Differential includes but not limited to viral syndrome, cardiopulmonary disease, ACS, metastatic spread of pancreatic cancer, PE, anemia.  Record review:   Previous records obtained and reviewed   Additional history obtained from sister  Work up as above, notable for:  Labs & imaging results that were available during my care of the patient were reviewed by me and considered in my medical decision making.   I ordered imaging studies which included CT PE, CT a/p, CT T/L spine and I independently visualized and interpreted imaging which showed mx PE, questionable RHS, multiple metastatic lesions, also likely new compression fracture in T spine.  Patient still requiring submental oxygen, 2 L.  Hypoxia likely secondary to PE, likely provoked by known pancreatic malignancy.  We will start patient on anticoagulation.  Recommend admission at this time given significant clot burden and questionable right heart strain.  CT with possible infection to the lungs.  Possible pneumonia.  Patient has no cough, congestion, fevers or chills.  She does have some difficulty  breathing which is improved.  Suspicion is low at this time for pneumonia.  Will defer further management this to hospitalist.  Possible compression fracture of T10.  Placed into TSLO brace. 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  No acute intervention at this time recommended by vascular surgery in setting of acute PE on [facility].  Recommend patient follow-up with heme-onc regarding treatment of malignancy and follow-up with vascular as needed.  Management: Patient given Dilaudid, Zofran,, heparin infusion   Reassessment:  Patient reports having significant improvement to her pain following analgesics.   Would recommend urgent hematology oncology evaluation of patient's presumed pancreatic malignancy while hospitalized  Patient with PE, demand ischemia likely secondary to PE, hypoxia.  Large tumor burden.  Compression fracture of T10.  Recommend mission.  Patient agreeable.  Discussed with hospitalist who accepts patient for admission.     This chart was dictated using voice recognition software.  Despite best efforts to proofread,  errors can occur which can change the documentation meaning.  Final Clinical Impression(s) / ED Diagnoses Final diagnoses:  Multiple subsegmental pulmonary emboli without acute cor pulmonale (HCC)  NSTEMI (non-ST elevated myocardial infarction) ([place])  Acute respiratory failure with hypoxia (HCC)  Pancreatic mass  Compression fracture of T10 vertebra, initial encounter [facility])    Rx / DC Orders ED Discharge Orders     None         Jeanell Sparrow, DO 06/05/21 0045

## 2021-06-04 NOTE — ED Notes (Signed)
[facility] made aware of order for brace.

## 2021-06-04 NOTE — Telephone Encounter (Signed)
Will increase her oxycodone 5 mg to every 4 hours. Will place home health and palliative referral.   Her Oncologists appointment is not until 06/06/20.   If pain worsens needs to go to ED to get controlled. Discussed with her sister. Sister is crying.

## 2021-06-04 NOTE — ED Notes (Signed)
Pt denies sob at this time. Alert and oriented. States her biggest concern is her lower back pain. Sister at bedside, states that pain is related to cancer dx.

## 2021-06-05 ENCOUNTER — Encounter (HOSPITAL_COMMUNITY): Payer: Self-pay | Admitting: Internal Medicine

## 2021-06-05 ENCOUNTER — Inpatient Hospital Stay (HOSPITAL_COMMUNITY): Payer: Medicare Other

## 2021-06-05 DIAGNOSIS — I2699 Other pulmonary embolism without acute cor pulmonale: Secondary | ICD-10-CM | POA: Diagnosis not present

## 2021-06-05 DIAGNOSIS — I2693 Single subsegmental pulmonary embolism without acute cor pulmonale: Secondary | ICD-10-CM | POA: Diagnosis not present

## 2021-06-05 LAB — ECHOCARDIOGRAM COMPLETE
AR max vel: 2.1 cm2
AV Area VTI: 2.1 cm2
AV Area mean vel: 2.06 cm2
AV Mean grad: 4 mmHg
AV Peak grad: 10.6 mmHg
Ao pk vel: 1.63 m/s
Area-P 1/2: 3.33 cm2
Calc EF: 59.2 %
Height: 62 in
MV VTI: 2.43 cm2
S' Lateral: 2.9 cm
Single Plane A2C EF: 57 %
Single Plane A4C EF: 57.7 %
Weight: 2201.07 oz

## 2021-06-05 LAB — CBC
HCT: 28.3 % — ABNORMAL LOW (ref 36.0–46.0)
Hemoglobin: 8.9 g/dL — ABNORMAL LOW (ref 12.0–15.0)
MCH: 27.8 pg (ref 26.0–34.0)
MCHC: 31.4 g/dL (ref 30.0–36.0)
MCV: 88.4 fL (ref 80.0–100.0)
Platelets: 161 10*3/uL (ref 150–400)
RBC: 3.2 MIL/uL — ABNORMAL LOW (ref 3.87–5.11)
RDW: 15.3 % (ref 11.5–15.5)
WBC: 6.4 10*3/uL (ref 4.0–10.5)
nRBC: 0 % (ref 0.0–0.2)

## 2021-06-05 LAB — COMPREHENSIVE METABOLIC PANEL
ALT: 39 U/L (ref 0–44)
AST: 32 U/L (ref 15–41)
Albumin: 2.8 g/dL — ABNORMAL LOW (ref 3.5–5.0)
Alkaline Phosphatase: 53 U/L (ref 38–126)
Anion gap: 6 (ref 5–15)
BUN: 21 mg/dL (ref 8–23)
CO2: 23 mmol/L (ref 22–32)
Calcium: 12.1 mg/dL — ABNORMAL HIGH (ref 8.9–10.3)
Chloride: 107 mmol/L (ref 98–111)
Creatinine, Ser: 0.81 mg/dL (ref 0.44–1.00)
GFR, Estimated: >60 mL/min (ref >60)
Glucose, Bld: 115 mg/dL — ABNORMAL HIGH (ref 70–99)
Potassium: 3.9 mmol/L (ref 3.5–5.1)
Sodium: 136 mmol/L (ref 135–145)
Total Bilirubin: 0.6 mg/dL (ref 0.3–1.2)
Total Protein: 5.6 g/dL — ABNORMAL LOW (ref 6.5–8.1)

## 2021-06-05 LAB — PROCALCITONIN: Procalcitonin: <0.1 ng/mL

## 2021-06-05 LAB — HEPARIN LEVEL (UNFRACTIONATED)
Heparin Unfractionated: <0.1 IU/mL — ABNORMAL LOW (ref 0.30–0.70)
Heparin Unfractionated: 0.46 IU/mL (ref 0.30–0.70)
Heparin Unfractionated: 0.45 IU/mL (ref 0.30–0.70)

## 2021-06-05 LAB — TROPONIN I (HIGH SENSITIVITY)
Troponin I (High Sensitivity): 42 ng/L — ABNORMAL HIGH (ref <18)
Troponin I (High Sensitivity): 52 ng/L — ABNORMAL HIGH (ref <18)

## 2021-06-05 LAB — APTT: aPTT: 33 seconds (ref 24–36)

## 2021-06-05 LAB — MAGNESIUM: Magnesium: 1.7 mg/dL (ref 1.7–2.4)

## 2021-06-05 LAB — PHOSPHORUS: Phosphorus: 2.1 mg/dL — ABNORMAL LOW (ref 2.5–4.6)

## 2021-06-05 MED ORDER — SODIUM CHLORIDE 0.9 % IV SOLN
Freq: Once | INTRAVENOUS | Status: DC
Start: 2021-06-05 — End: 2021-06-05

## 2021-06-05 MED ORDER — HEPARIN BOLUS VIA INFUSION
2000.0000 [IU] | Freq: Once | INTRAVENOUS | Status: AC
  Administered 2021-06-05: 2000 [IU] via INTRAVENOUS
  Filled 2021-06-05: qty 2000

## 2021-06-05 MED ORDER — SODIUM CHLORIDE 0.9 % IV SOLN: INTRAVENOUS | Status: DC

## 2021-06-05 NOTE — Progress Notes (Signed)
Progress Note    Andrea Moreno  WNU:[id number] DOB: June 24, 1940  DOA: 06/04/2021 PCP: Sharion Balloon, FNP      Brief Narrative:    Medical records reviewed and are as summarized below:  Andrea Moreno is a 81 y.o. female with medical history significant for pancreatic cancer, hypertension, GERD, who presented to the hospital with back pain and shortness of breath.  She has had chronic back pain for about 3 months now and has had no relief with analgesics at home.  However, she noticed significant worsening of her back pain for about 1 week prior to admission.      Assessment/Plan:   Principal Problem:   Pulmonary embolism (HCC) Active Problems:   Essential hypertension   Chronic back pain   Spondylosis of lumbar spine   Pleural effusion on right   Atelectasis   Pancreatic cancer (HCC)   Portal vein thrombosis   Liver lesion, left lobe   Splenic infarct   Acute respiratory failure with hypoxia (HCC)   Elevated d-dimer   Hypercalcemia   Elevated troponin   Elevated brain natriuretic peptide (BNP) level   Hyperglycemia   Acute pulmonary embolism (HCC)   Body mass index is 25.16 kg/m.  Acute hypoxemic respiratory failure: Improved.  She is tolerating room air  Acute pulmonary embolism: Continue IV heparin infusion.  Transition to oral anticoagulation will be delayed for now in case she needs any surgical intervention for T10 fracture.  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  Acute on chronic back pain, T10 compression fracture, lumbar spine spondylosis: Continue with TLSO brace: Continue analgesics as needed for pain.  Consulted Dr. Aline Brochure, orthopedic surgeon, for further evaluation.  Hypercalcemia: This is probably from underlying malignancy.  Treat with IV fluids.  Pancreatic cancer, hepatic lesions suspicious for metastatic  disease: Outpatient follow-up with oncologist.  Mildly elevated troponins: This is probably from acute pulmonary embolism.  2D echo showed EF estimated at 60 to 03%, grade 1 diastolic dysfunction.  Diet Order             Diet Heart Room service appropriate? Yes; Fluid consistency: Thin  Diet effective now                      Consultants: Orthopedic surgeon  Procedures: None    Medications:    aspirin  325 mg Oral Daily   Continuous Infusions:  heparin 1,250 Units/hr (06/05/21 0700)   methocarbamol (ROBAXIN) IV       Anti-infectives (From admission, onward)    None              Family Communication/Anticipated D/C date and plan/Code Status   DVT prophylaxis: SCDs Start: 06/04/21 2355     Code Status: Full Code  Family Communication: None Disposition Plan: Plan to discharge home in 1 to 2 days   Status is: Inpatient  Remains inpatient appropriate because: IV heparin           Subjective:   C/o back pain  Objective:    Vitals:   06/04/21 2252 06/05/21 0300 06/05/21 0538 06/05/21 0654  BP: (!) 124/46 (!) 121/99  (!) 136/52  Pulse: 71 80  88  Resp: 20 20  18   Temp: 98.1 F (36.7 C) 98.1 F (36.7 C)  98.6 F (37 C)  TempSrc: Oral Oral  Oral  SpO2: 98% 96%  97%  Weight:  61.3 kg  62.4 kg   Height: 5\' 2"  (1.575 m)      No data found.   Intake/Output Summary (Last 24 hours) at 06/05/2021 1655 Last data filed at 06/05/2021 0601 Gross per 24 hour  Intake 123.19 ml  Output --  Net 123.19 ml   Filed Weights   06/04/21 1654 06/04/21 2252 06/05/21 0538  Weight: 63.2 kg 61.3 kg 62.4 kg    Exam:  GEN: NAD SKIN: No rash EYES: EOMI ENT: MMM CV: RRR PULM: CTA B ABD: soft, ND, NT, +BS CNS: AAO x 3, non focal EXT: No edema or tenderness MSK: Lower thoracic spinal tenderness        Data Reviewed:   I have personally reviewed following labs and imaging studies:  Labs: Labs show the following:   Basic Metabolic  Panel: Recent Labs  Lab 06/04/21 1645 06/05/21 0330  NA 134* 136  K 4.2 3.9  CL 103 107  CO2 22 23  GLUCOSE 106* 115*  BUN 25* 21  CREATININE 0.89 0.81  CALCIUM 12.6* 12.1*  MG  --  1.7  PHOS  --  2.1*   GFR Estimated Creatinine Clearance: 48.1 mL/min (by C-G formula based on SCr of 0.81 mg/dL). Liver Function Tests: Recent Labs  Lab 06/04/21 1645 06/05/21 0330  AST 42* 32  ALT 46* 39  ALKPHOS 58 53  BILITOT 0.9 0.6  PROT 6.7 5.6*  ALBUMIN 3.3* 2.8*   No results for input(s): LIPASE, AMYLASE in the last 168 hours. No results for input(s): AMMONIA in the last 168 hours. Coagulation profile No results for input(s): INR, PROTIME in the last 168 hours.  CBC: Recent Labs  Lab 06/04/21 1645 06/05/21 0330  WBC 9.2 6.4  NEUTROABS 7.0  --   HGB 9.2* 8.9*  HCT 28.9* 28.3*  MCV 87.3 88.4  PLT 170 161   Cardiac Enzymes: No results for input(s): CKTOTAL, CKMB, CKMBINDEX, TROPONINI in the last 168 hours. BNP (last 3 results) No results for input(s): PROBNP in the last 8760 hours. CBG: No results for input(s): GLUCAP in the last 168 hours. D-Dimer: Recent Labs    06/04/21 1645  DDIMER 10.85*   Hgb A1c: No results for input(s): HGBA1C in the last 72 hours. Lipid Profile: No results for input(s): CHOL, HDL, LDLCALC, TRIG, CHOLHDL, LDLDIRECT in the last 72 hours. Thyroid function studies: No results for input(s): TSH, T4TOTAL, T3FREE, THYROIDAB in the last 72 hours.  Invalid input(s): FREET3 Anemia work up: No results for input(s): VITAMINB12, FOLATE, FERRITIN, TIBC, IRON, RETICCTPCT in the last 72 hours. Sepsis Labs: Recent Labs  Lab 06/04/21 1645 06/05/21 0330  PROCALCITON  --  <0.10  WBC 9.2 6.4    Microbiology Recent Results (from the past 240 hour(s))  Resp Panel by RT-PCR (Flu A&B, Covid) Nasopharyngeal Swab     Status: None   Collection Time: 06/04/21  4:57 PM   Specimen: Nasopharyngeal Swab; Nasopharyngeal(NP) swabs in vial transport medium   Result Value Ref Range Status   SARS Coronavirus 2 by RT PCR NEGATIVE NEGATIVE Final    Comment: (NOTE) SARS-CoV-2 target nucleic acids are NOT DETECTED.  The SARS-CoV-2 RNA is generally detectable in upper respiratory specimens during the acute phase of infection. The lowest concentration of SARS-CoV-2 viral copies this assay can detect is 138 copies/mL. A negative result does not preclude SARS-Cov-2 infection and should not be used as the sole basis for treatment or other patient management decisions. A negative result may occur with  improper  specimen collection/handling, submission of specimen other than nasopharyngeal swab, presence of viral mutation(s) within the areas targeted by this assay, and inadequate number of viral copies(<138 copies/mL). A negative result must be combined with clinical observations, patient history, and epidemiological information. The expected result is Negative.  Fact Sheet for Patients:  EntrepreneurPulse.com.au  Fact Sheet for Healthcare Providers:  IncredibleEmployment.be  This test is no t yet approved or cleared by the [place] FDA and  has been authorized for detection and/or diagnosis of SARS-CoV-2 by FDA under an Emergency Use Authorization (EUA). This EUA will remain  in effect (meaning this test can be used) for the duration of the COVID-19 declaration under Section 564(b)(1) of the Act, 21 U.S.C.section 360bbb-3(b)(1), unless the authorization is terminated  or revoked sooner.       Influenza A by PCR NEGATIVE NEGATIVE Final   Influenza B by PCR NEGATIVE NEGATIVE Final    Comment: (NOTE) The Xpert Xpress SARS-CoV-2/FLU/RSV plus assay is intended as an aid in the diagnosis of influenza from Nasopharyngeal swab specimens and should not be used as a sole basis for treatment. Nasal washings and aspirates are unacceptable for Xpert Xpress SARS-CoV-2/FLU/RSV testing.  Fact Sheet for  Patients: EntrepreneurPulse.com.au  Fact Sheet for Healthcare Providers: IncredibleEmployment.be  This test is not yet approved or cleared by the [place] FDA and has been authorized for detection and/or diagnosis of SARS-CoV-2 by FDA under an Emergency Use Authorization (EUA). This EUA will remain in effect (meaning this test can be used) for the duration of the COVID-19 declaration under Section 564(b)(1) of the Act, 21 U.S.C. section 360bbb-3(b)(1), unless the authorization is terminated or revoked.  Performed at [facility], [address] [place], [place] [address]     Procedures and diagnostic studies:  CT Angio Chest PE W and/or Wo Contrast  Result Date: 06/04/2021 CLINICAL DATA:  Epigastric pain.  Pancreatic malignancy. EXAM: CT ANGIOGRAPHY CHEST CT ABDOMEN AND PELVIS WITH CONTRAST TECHNIQUE: Multidetector CT imaging of the chest was performed using the standard protocol during bolus administration of intravenous contrast. Multiplanar CT image reconstructions and MIPs were obtained to evaluate the vascular anatomy. Multidetector CT imaging of the abdomen and pelvis was performed using the standard protocol during bolus administration of intravenous contrast. CONTRAST:  49mL OMNIPAQUE IOHEXOL 350 MG/ML SOLN COMPARISON:  CT renal stone 05/21/2021. FINDINGS: CTA CHEST FINDINGS Cardiovascular: The heart and aorta are normal in size. There are atherosclerotic calcifications of the aorta. There is no pericardial effusion. There is adequate opacification of the pulmonary arteries to the segmental level. There are segmental and subsegmental right lower lobe pulmonary emboli. There are subsegmental right middle lobe pulmonary emboli. Mediastinum/Nodes: There is a moderate hiatal hernia. The esophagus is nondilated. There are no enlarged mediastinal or hilar lymph nodes. Lungs/Pleura: There is a trace right pleural effusion. There is bilateral lower lung  atelectasis. There is a small amount of patchy airspace and ground-glass opacity in the right lower lobe. Trachea and central airways are patent. No pneumothorax. No suspicious pulmonary nodules. Musculoskeletal: No chest wall abnormality. No acute or significant osseous findings. There is moderate compression fracture of T10 which is new from the prior examination. No axillary lymphadenopathy. Review of the MIP images confirms the above findings. CT ABDOMEN and PELVIS FINDINGS Hepatobiliary: There is a 15 mm hypodense lesion in the left lobe of the liver image 2/19. There is a similar appearing hypodense lesion measuring 2 cm in the left lobe of the liver image 2/32. Small left hepatic  cyst in the left lobe measures 9 mm. No gallstones are identified. Gallbladder sludge is likely present. No definite biliary ductal dilatation. Pancreas: Lobulated low-density mass in the region of the body and tail the pancreas measures 5.8 x 11.4 x 5.5 cm. Low-attenuation lesion in the head of the pancreas versus dilated duct seen on image 2/26 measuring 15 mm. There is loss of fat plane between the lesser curvature of the stomach. This mass encompasses the celiac axis and its proximal branches, possibly causing occlusion. Superior mesenteric vein not definitely involved. 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 Splenic vein not visualized and may be occluded as well. Spleen: There is small wedge-shaped area of hypodensity in the spleen suspicious for infarct. No perisplenic fluid collection identified. The spleen is nonenlarged. Adrenals/Urinary Tract: The bilateral adrenal glands are within normal limits. There are punctate nonobstructing right renal calculi. There is no hydronephrosis in either kidney. There is a 5.2 by 3.8 cm cyst in the right kidney. There is an additional cortical hypodensity in the right kidney which is too small to characterize image 2/23.  This likely also represents cyst. Bladder is grossly within normal limits, but evaluation is limited secondary to streak artifact in the pelvis. Stomach/Bowel: No evidence for bowel obstruction, free air or pneumatosis. There is a large amount of stool throughout the colon. The appendix is not seen. Small bowel loops are within normal limits. Pancreatic mass abuts the body of the stomach with loss of fat plane. The stomach is otherwise normal. There is a moderate-sized hiatal hernia. Vascular/Lymphatic: Aorta and IVC are normal in size. There are atherosclerotic calcifications of the aorta. No discrete enlarged lymph nodes are identified. Reproductive: Uterus and bilateral adnexa are unremarkable. Other: There is a small amount of free fluid in the pelvis. There is a small fat containing umbilical hernia. Musculoskeletal: Degenerative changes affect the spine. No acute fractures are seen. No focal osseous lesions are seen. Right hip arthroplasty is present. Review of the MIP images confirms the above findings. IMPRESSION: 1. Segmental and subsegmental right lower lobe pulmonary emboli. Subsegmental right middle lobe pulmonary emboli. (RV/LV Ratio = 1.0) 2. Trace right pleural effusion. 3. Minimal patchy airspace disease and ground-glass opacities in the right lower lobe suspicious for infection. 4. T10 compression fracture is new from May 21, 2021. 5. Large mass involving the pancreatic body and tail most compatible with primary pancreatic neoplasm. Questionable ductal dilatation in the head of the pancreas versus second smaller lesion. 6. 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 7. 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 8. Likely small splenic infarct. 9. There are 2 suspicious lesions in the left lobe of the liver, likely metastatic disease. 10. Nonobstructing right renal calculi. 11. Small amount of  free fluid in the pelvis. 12. Large stool burden. 13.  Aortic Atherosclerosis (ICD10-I70.0). These results were called by telephone at the time of interpretation on 06/04/2021 at 7:28 pm to provider Wynona Dove , who verbally acknowledged these results. Electronically Signed   By: Ronney Asters M.D.   On: 06/04/2021 19:32   CT ABDOMEN PELVIS W CONTRAST  Result Date: 06/04/2021 CLINICAL DATA:  Epigastric pain.  Pancreatic malignancy. EXAM: CT ANGIOGRAPHY CHEST CT ABDOMEN AND PELVIS WITH CONTRAST TECHNIQUE: Multidetector CT imaging of the chest was performed using the standard protocol during bolus administration of intravenous contrast. Multiplanar CT  image reconstructions and MIPs were obtained to evaluate the vascular anatomy. Multidetector CT imaging of the abdomen and pelvis was performed using the standard protocol during bolus administration of intravenous contrast. CONTRAST:  47mL OMNIPAQUE IOHEXOL 350 MG/ML SOLN COMPARISON:  CT renal stone 05/21/2021. FINDINGS: CTA CHEST FINDINGS Cardiovascular: The heart and aorta are normal in size. There are atherosclerotic calcifications of the aorta. There is no pericardial effusion. There is adequate opacification of the pulmonary arteries to the segmental level. There are segmental and subsegmental right lower lobe pulmonary emboli. There are subsegmental right middle lobe pulmonary emboli. Mediastinum/Nodes: There is a moderate hiatal hernia. The esophagus is nondilated. There are no enlarged mediastinal or hilar lymph nodes. Lungs/Pleura: There is a trace right pleural effusion. There is bilateral lower lung atelectasis. There is a small amount of patchy airspace and ground-glass opacity in the right lower lobe. Trachea and central airways are patent. No pneumothorax. No suspicious pulmonary nodules. Musculoskeletal: No chest wall abnormality. No acute or significant osseous findings. There is moderate compression fracture of T10 which is new from the prior  examination. No axillary lymphadenopathy. Review of the MIP images confirms the above findings. CT ABDOMEN and PELVIS FINDINGS Hepatobiliary: There is a 15 mm hypodense lesion in the left lobe of the liver image 2/19. There is a similar appearing hypodense lesion measuring 2 cm in the left lobe of the liver image 2/32. Small left hepatic cyst in the left lobe measures 9 mm. No gallstones are identified. Gallbladder sludge is likely present. No definite biliary ductal dilatation. Pancreas: Lobulated low-density mass in the region of the body and tail the pancreas measures 5.8 x 11.4 x 5.5 cm. Low-attenuation lesion in the head of the pancreas versus dilated duct seen on image 2/26 measuring 15 mm. There is loss of fat plane between the lesser curvature of the stomach. This mass encompasses the celiac axis and its proximal branches, possibly causing occlusion. Superior mesenteric vein not definitely involved. 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 Splenic vein not visualized and may be occluded as well. Spleen: There is small wedge-shaped area of hypodensity in the spleen suspicious for infarct. No perisplenic fluid collection identified. The spleen is nonenlarged. Adrenals/Urinary Tract: The bilateral adrenal glands are within normal limits. There are punctate nonobstructing right renal calculi. There is no hydronephrosis in either kidney. There is a 5.2 by 3.8 cm cyst in the right kidney. There is an additional cortical hypodensity in the right kidney which is too small to characterize image 2/23. This likely also represents cyst. Bladder is grossly within normal limits, but evaluation is limited secondary to streak artifact in the pelvis. Stomach/Bowel: No evidence for bowel obstruction, free air or pneumatosis. There is a large amount of stool throughout the colon. The appendix is not seen. Small bowel loops are within normal limits. Pancreatic  mass abuts the body of the stomach with loss of fat plane. The stomach is otherwise normal. There is a moderate-sized hiatal hernia. Vascular/Lymphatic: Aorta and IVC are normal in size. There are atherosclerotic calcifications of the aorta. No discrete enlarged lymph nodes are identified. Reproductive: Uterus and bilateral adnexa are unremarkable. Other: There is a small amount of free fluid in the pelvis. There is a small fat containing umbilical hernia. Musculoskeletal: Degenerative changes affect the spine. No acute fractures are seen. No focal osseous lesions are seen. Right hip arthroplasty is present. Review of the MIP images confirms the above  findings. IMPRESSION: 1. Segmental and subsegmental right lower lobe pulmonary emboli. Subsegmental right middle lobe pulmonary emboli. (RV/LV Ratio = 1.0) 2. Trace right pleural effusion. 3. Minimal patchy airspace disease and ground-glass opacities in the right lower lobe suspicious for infection. 4. T10 compression fracture is new from May 21, 2021. 5. Large mass involving the pancreatic body and tail most compatible with primary pancreatic neoplasm. Questionable ductal dilatation in the head of the pancreas versus second smaller lesion. 6. 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 7. 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 8. Likely small splenic infarct. 9. There are 2 suspicious lesions in the left lobe of the liver, likely metastatic disease. 10. Nonobstructing right renal calculi. 11. Small amount of free fluid in the pelvis. 12. Large stool burden. 13.  Aortic Atherosclerosis (ICD10-I70.0). These results were called by telephone at the time of interpretation on 06/04/2021 at 7:28 pm to provider Wynona Dove , who verbally acknowledged these results. Electronically Signed   By: Ronney Asters M.D.   On: 06/04/2021 19:32   CT T-SPINE NO CHARGE  Result  Date: 06/04/2021 CLINICAL DATA:  Initial evaluation for acute trauma, fall. EXAM: CT Thoracic and Lumbar spine  Contrast TECHNIQUE: Multiplanar CT images of the thoracic and lumbar spine were reconstructed from contemporary CT of the Chest, Abdomen, and Pelvis CONTRAST:  None or No additional COMPARISON:  Comparison made with concomitant CT of the chest, abdomen, and pelvis. FINDINGS: CT THORACIC SPINE FINDINGS Alignment: Exaggeration of the normal thoracic kyphosis. No listhesis. Vertebrae: There is an acute compression fracture involving the T10 vertebral body. Associated height loss measures up to 50% with 3 mm bony retropulsion. No significant stenosis. Otherwise, vertebral body height maintained with no other acute or chronic fracture. Visualized ribs intact. No worrisome osseous lesions. Paraspinal and other soft tissues: Paraspinous soft tissues demonstrate no acute finding. Small layering right pleural effusion with associated atelectasis. Previously identified pancreatic neoplasm partially visualize, better evaluated on concomitant CT of the abdomen and pelvis. Hiatal hernia noted. Scattered aortic atherosclerosis. Acute pulmonary embolism within right lower lobe segmental branches, also better evaluated on concomitant chest CT. Disc levels: No significant disc pathology or stenosis seen within the thoracic spine. CT LUMBAR SPINE FINDINGS Segmentation: Standard. Lowest well-formed disc space labeled the L5-S1 level. Alignment: Moderate levoscoliosis with apex at L2-3. Alignment otherwise normal preservation of the normal lumbar lordosis. Vertebrae: Vertebral body height maintained without acute or chronic fracture. Visualized sacrum and pelvis intact. No worrisome osseous lesions. Paraspinal and other soft tissues: Paraspinous soft tissues demonstrate no acute finding. 5.4 cm parapelvic cyst noted within the right kidney. Nonobstructive right renal nephrolithiasis noted. Aorto bi-iliac atherosclerotic  disease. Disc levels: Mild multilevel degenerative spondylosis, most pronounced at L1-2, L4-5, and L5-S1. Lower lumbar facet hypertrophy. No significant spinal stenosis. Moderate bilateral L5 foraminal narrowing. IMPRESSION: CT THORACIC SPINE IMPRESSION: 1. Compression fracture involving the T10 vertebral body with up to 50% height loss and 3 mm bony retropulsion, new as compared to 05/21/2021, consistent with an acute/subacute finding. No significant stenosis. 2. No other acute traumatic injury within the thoracic spine. 3. Acute right-sided pulmonary emboli, better evaluated on concomitant CT of the chest. 4. Small layering right pleural effusion with associated atelectasis. CT LUMBAR SPINE IMPRESSION: 1. No acute traumatic injury within the lumbar spine. 2. Moderate levoscoliosis with apex at L2-3. 3. Mild multilevel degenerative spondylosis, most pronounced at L1-2, L4-5, and L5-S1. Moderate  bilateral L5 foraminal narrowing. 4. Nonobstructive right renal nephrolithiasis. 5. Aortic Atherosclerosis (ICD10-I70.0). Results regarding the acute pulmonary emboli were communicated to the ordering clinician by Dr. Ronney Asters, documented on corresponding CTA of the chest. Electronically Signed   By: Jeannine Boga M.D.   On: 06/04/2021 19:45   CT L-SPINE NO CHARGE  Result Date: 06/04/2021 CLINICAL DATA:  Initial evaluation for acute trauma, fall. EXAM: CT Thoracic and Lumbar spine  Contrast TECHNIQUE: Multiplanar CT images of the thoracic and lumbar spine were reconstructed from contemporary CT of the Chest, Abdomen, and Pelvis CONTRAST:  None or No additional COMPARISON:  Comparison made with concomitant CT of the chest, abdomen, and pelvis. FINDINGS: CT THORACIC SPINE FINDINGS Alignment: Exaggeration of the normal thoracic kyphosis. No listhesis. Vertebrae: There is an acute compression fracture involving the T10 vertebral body. Associated height loss measures up to 50% with 3 mm bony retropulsion. No  significant stenosis. Otherwise, vertebral body height maintained with no other acute or chronic fracture. Visualized ribs intact. No worrisome osseous lesions. Paraspinal and other soft tissues: Paraspinous soft tissues demonstrate no acute finding. Small layering right pleural effusion with associated atelectasis. Previously identified pancreatic neoplasm partially visualize, better evaluated on concomitant CT of the abdomen and pelvis. Hiatal hernia noted. Scattered aortic atherosclerosis. Acute pulmonary embolism within right lower lobe segmental branches, also better evaluated on concomitant chest CT. Disc levels: No significant disc pathology or stenosis seen within the thoracic spine. CT LUMBAR SPINE FINDINGS Segmentation: Standard. Lowest well-formed disc space labeled the L5-S1 level. Alignment: Moderate levoscoliosis with apex at L2-3. Alignment otherwise normal preservation of the normal lumbar lordosis. Vertebrae: Vertebral body height maintained without acute or chronic fracture. Visualized sacrum and pelvis intact. No worrisome osseous lesions. Paraspinal and other soft tissues: Paraspinous soft tissues demonstrate no acute finding. 5.4 cm parapelvic cyst noted within the right kidney. Nonobstructive right renal nephrolithiasis noted. Aorto bi-iliac atherosclerotic disease. Disc levels: Mild multilevel degenerative spondylosis, most pronounced at L1-2, L4-5, and L5-S1. Lower lumbar facet hypertrophy. No significant spinal stenosis. Moderate bilateral L5 foraminal narrowing. IMPRESSION: CT THORACIC SPINE IMPRESSION: 1. Compression fracture involving the T10 vertebral body with up to 50% height loss and 3 mm bony retropulsion, new as compared to 05/21/2021, consistent with an acute/subacute finding. No significant stenosis. 2. No other acute traumatic injury within the thoracic spine. 3. Acute right-sided pulmonary emboli, better evaluated on concomitant CT of the chest. 4. Small layering right pleural  effusion with associated atelectasis. CT LUMBAR SPINE IMPRESSION: 1. No acute traumatic injury within the lumbar spine. 2. Moderate levoscoliosis with apex at L2-3. 3. Mild multilevel degenerative spondylosis, most pronounced at L1-2, L4-5, and L5-S1. Moderate bilateral L5 foraminal narrowing. 4. Nonobstructive right renal nephrolithiasis. 5. Aortic Atherosclerosis (ICD10-I70.0). Results regarding the acute pulmonary emboli were communicated to the ordering clinician by Dr. Ronney Asters, documented on corresponding CTA of the chest. Electronically Signed   By: Jeannine Boga M.D.   On: 06/04/2021 19:45   DG Chest Port 1 View  Result Date: 06/04/2021 CLINICAL DATA:  Shortness of breath.  Back pain. EXAM: PORTABLE CHEST 1 VIEW COMPARISON:  Two-view chest x-ray 01/22/2016 FINDINGS: Heart size upper limits of normal, exaggerated by low lung volumes. Atherosclerotic changes are present in the arch. Mild atelectasis or scarring is present both bases. No edema or effusion is present airspace consolidation is present. Bony thorax is within normal limits. IMPRESSION: 1. Low lung volumes. 2. Mild bibasilar atelectasis or scarring. 3. Atherosclerosis. Electronically Signed   By: Harrell Gave  Mattern M.D.   On: 06/04/2021 17:44   ECHOCARDIOGRAM COMPLETE  Result Date: 06/05/2021    ECHOCARDIOGRAM REPORT   Patient Name:   CARDELIA SASSANO Date of Exam: 06/05/2021 Medical Rec #:  [id number]         Height:       62.0 in Accession #:    [id number]        Weight:       137.6 lb Date of Birth:  1940-12-12         BSA:          1.631 m Patient Age:    70 years          BP:           136/52 mmHg Patient Gender: F                 HR:           88 bpm. Exam Location:  [facility] Procedure: 2D Echo, Cardiac Doppler and Color Doppler Indications:    Pulmonary Embolus  History:        Patient has prior history of Echocardiogram examinations, most                 recent 10/15/2012. TIA, Arrythmias:Bradycardia; Risk                  Factors:Hypertension, Dyslipidemia and Former Smoker.  Sonographer:    Wenda Low Referring Phys: [id number] OLADAPO ADEFESO IMPRESSIONS  1. Left ventricular ejection fraction, by estimation, is 60 to 65%. The left ventricle has normal function. The left ventricle has no regional wall motion abnormalities. Left ventricular diastolic parameters are consistent with Grade I diastolic dysfunction (impaired relaxation).  2. Right ventricular systolic function is normal. The right ventricular size is normal. Tricuspid regurgitation signal is inadequate for assessing PA pressure.  3. Left atrial size was mildly dilated.  4. The mitral valve is normal in structure. No evidence of mitral valve regurgitation. No evidence of mitral stenosis.  5. The aortic valve is tricuspid. Aortic valve regurgitation is trivial. No aortic stenosis is present. FINDINGS  Left Ventricle: Left ventricular ejection fraction, by estimation, is 60 to 65%. The left ventricle has normal function. The left ventricle has no regional wall motion abnormalities. The left ventricular internal cavity size was normal in size. There is  no left ventricular hypertrophy. Left ventricular diastolic parameters are consistent with Grade I diastolic dysfunction (impaired relaxation). Normal left ventricular filling pressure. Right Ventricle: The right ventricular size is normal. No increase in right ventricular wall thickness. Right ventricular systolic function is normal. Tricuspid regurgitation signal is inadequate for assessing PA pressure. Left Atrium: Left atrial size was mildly dilated. Right Atrium: Right atrial size was normal in size. Pericardium: There is no evidence of pericardial effusion. Mitral Valve: The mitral valve is normal in structure. No evidence of mitral valve regurgitation. No evidence of mitral valve stenosis. MV peak gradient, 4.5 mmHg. The mean mitral valve gradient is 2.0 mmHg. Tricuspid Valve: The tricuspid valve is normal in  structure. Tricuspid valve regurgitation is trivial. No evidence of tricuspid stenosis. Aortic Valve: The aortic valve is tricuspid. Aortic valve regurgitation is trivial. No aortic stenosis is present. Aortic valve mean gradient measures 4.0 mmHg. Aortic valve peak gradient measures 10.6 mmHg. Aortic valve area, by VTI measures 2.10 cm. Pulmonic Valve: The pulmonic valve was not well visualized. Pulmonic valve regurgitation is not visualized. No evidence of pulmonic stenosis. Aorta: The aortic root is  normal in size and structure. Venous: The inferior vena cava was not well visualized. IAS/Shunts: The interatrial septum was not well visualized.  LEFT VENTRICLE PLAX 2D LVIDd:         4.40 cm     Diastology LVIDs:         2.90 cm     LV e' medial:    7.40 cm/s LV PW:         1.00 cm     LV E/e' medial:  9.7 LV IVS:        1.00 cm     LV e' lateral:   5.66 cm/s LVOT diam:     1.90 cm     LV E/e' lateral: 12.7 LV SV:         64 LV SV Index:   39 LVOT Area:     2.84 cm  LV Volumes (MOD) LV vol d, MOD A2C: 40.0 ml LV vol d, MOD A4C: 33.3 ml LV vol s, MOD A2C: 17.2 ml LV vol s, MOD A4C: 14.1 ml LV SV MOD A2C:     22.8 ml LV SV MOD A4C:     33.3 ml LV SV MOD BP:      21.6 ml RIGHT VENTRICLE RV Basal diam:  3.25 cm RV Mid diam:    2.90 cm RV S prime:     14.10 cm/s TAPSE (M-mode): 3.7 cm LEFT ATRIUM             Index        RIGHT ATRIUM           Index LA diam:        4.10 cm 2.51 cm/m   RA Area:     11.90 cm LA Vol (A2C):   60.8 ml 37.29 ml/m  RA Volume:   27.50 ml  16.86 ml/m LA Vol (A4C):   57.4 ml 35.20 ml/m LA Biplane Vol: 59.4 ml 36.43 ml/m  AORTIC VALVE                    PULMONIC VALVE AV Area (Vmax):    2.10 cm     PV Vmax:       1.14 m/s AV Area (Vmean):   2.06 cm     PV Peak grad:  5.2 mmHg AV Area (VTI):     2.10 cm AV Vmax:           163.00 cm/s AV Vmean:          92.700 cm/s AV VTI:            0.306 m AV Peak Grad:      10.6 mmHg AV Mean Grad:      4.0 mmHg LVOT Vmax:         121.00 cm/s LVOT Vmean:         67.400 cm/s LVOT VTI:          0.227 m LVOT/AV VTI ratio: 0.74  AORTA Ao Root diam: 3.00 cm MITRAL VALVE MV Area (PHT): 3.33 cm    SHUNTS MV Area VTI:   2.43 cm    Systemic VTI:  0.23 m MV Peak grad:  4.5 mmHg    Systemic Diam: 1.90 cm MV Mean grad:  2.0 mmHg MV Vmax:       1.06 m/s MV Vmean:      56.4 cm/s MV Decel Time: 228 msec MV E velocity: 71.60 cm/s MV A velocity: 97.70 cm/s MV E/A ratio:  0.73 Carlyle Dolly MD Electronically signed by Carlyle Dolly MD Signature Date/Time: 06/05/2021/3:20:32 PM    Final                LOS: 1 day   Geonna Lockyer  [facility]   Pager on [email or web address]. If 7PM-7AM, please contact night-coverage at [email or web address]     06/05/2021, 4:55 PM

## 2021-06-05 NOTE — Progress Notes (Signed)
OT Cancellation Note  Patient Details Name: Andrea Moreno MRN: [id number] DOB: 10/03/1940   Cancelled Treatment:    Reason Eval/Treat Not Completed: Medical issues which prohibited therapy. Pt presents with heparin levels outside therapeutic range. Pt also has not been on heparin drip for 24 hours. Will attempt to see pt once it has been 24 hours since start of heparin drip.   Sonia Bromell OT, MOT     Larey Seat 06/05/2021, 9:25 AM

## 2021-06-05 NOTE — Progress Notes (Signed)
ANTICOAGULATION CONSULT NOTE Pharmacy Consult for Heparin Indication: VTE Brief A/P: Heparin level subtherapeutic Rebolus, increase Heparin rate  Allergies  Allergen Reactions   Celebrex [Celecoxib]     rash   Demerol [Meperidine] Hypertension   Lisinopril     Cough   Statins     Muscle aches   Zyrtec [Cetirizine] Itching    Patient Measurements: Height: 5\' 2"  (157.5 cm) Weight: 62.4 kg (137 lb 9.1 oz) IBW/kg (Calculated) : 50.1 Heparin Dosing Weight: 63 kg  Vital Signs:    Labs: Recent Labs    06/04/21 1645 06/04/21 1943 06/04/21 2343 06/05/21 0330 06/05/21 1409  HGB 9.2*  --   --  8.9*  --   HCT 28.9*  --   --  28.3*  --   PLT 170  --   --  161  --   APTT  --   --   --  33  --   HEPARINUNFRC  --   --   --  <0.10* 0.46  CREATININE 0.89  --   --  0.81  --   TROPONINIHS 59* 60* 52* 42*  --      Estimated Creatinine Clearance: 48.1 mL/min (by C-G formula based on SCr of 0.81 mg/dL).  Assessment: 81 y.o. female with new PE, portal, splenic, and SMV thrombosis, for heparin.  Heparin level now therapeutic   Goal of Therapy:  Heparin level 0.3-0.7 units/ml Monitor platelets by anticoagulation protocol: Yes   Plan:  Continue heparin at 1250 units/hr Check heparin level in 6 hours.   Kandy Towery A. Levada Dy, PharmD, BCPS, FNKF Clinical Pharmacist [place] Please utilize Amion for appropriate phone number to reach the unit pharmacist ([place])  06/05/2021,7:14 PM

## 2021-06-05 NOTE — Progress Notes (Shared)
[facility] [address], [place] [address]   CLINIC:  Medical Oncology/Hematology  CONSULT NOTE  Patient Care Team: Sharion Balloon, FNP as PCP - General (Nurse Practitioner) Almedia Balls, MD as Consulting Physician (Orthopedic Surgery) Celestia Khat, OD (Optometry)  CHIEF COMPLAINTS/PURPOSE OF CONSULTATION:  ***  HISTORY OF PRESENTING ILLNESS:  [name] 80 y.o. female is here because of ***, at the request of {REFERRING PHYSICIAN}.  MEDICAL HISTORY:  Past Medical History:  Diagnosis Date   Allergy    Arthritis    "back, right shoulder, hips" (10/14/2012)   Cataract    Complication of anesthesia    "1950's had appendix out; kicked the RN; no problems since" (10/14/2012)   Heart murmur    normal Echo 2014 per pt   History of migraine    1960s   Hyperlipidemia    Hypertension    Osteopenia    Pneumonia    "couple of times; last time in the mid 1990's" (10/14/2012)   TIA (transient ischemic attack) 10/14/2012    SURGICAL HISTORY: Past Surgical History:  Procedure Laterality Date   APPENDECTOMY  1950's   BACK SURGERY     COLONOSCOPY     DILATION AND CURETTAGE OF UTERUS  ~ [place]   "had a disc removed" (10/14/2012)   TONSILLECTOMY  1940's   TOTAL HIP ARTHROPLASTY Right 02/26/2016   Procedure: TOTAL HIP ARTHROPLASTY ANTERIOR APPROACH;  Surgeon: Renette Butters, MD;  Location: [facility];  Service: Orthopedics;  Laterality: Right;   TUBAL LIGATION  1960's    SOCIAL HISTORY: Social History   Socioeconomic History   Marital status: Widowed    Spouse name: Not on file   Number of children: 0   Years of education: 16   Highest education level: Bachelor's degree (e.g., BA, AB, BS)  Occupational History   Occupation: Retired    Comment: occupational therapy  Tobacco Use   Smoking status: Former    Packs/day: 0.30    Years: 5.00    Pack years: 1.50    Types: Cigarettes    Quit date: 08/31/1985    Years  since quitting: 35.7   Smokeless tobacco: Never  Vaping Use   Vaping Use: Never used  Substance and Sexual Activity   Alcohol use: Not Currently    Alcohol/week: 1.0 standard drink    Types: 1 Cans of beer per week    Comment: 10/14/2012 "~ 6 oz wine/day", 3 times a month (9/17)   Drug use: No   Sexual activity: Not Currently  Other Topics Concern   Not on file  Social History Narrative   Lives alone   Tai Chi once per week   Uses walker - high fall risk - several falls   Had Life Alert bracelet and didn't like it - got rid of it- has cell phone in pocket at all times with close friends on speed dial   Social Determinants of Health   Financial Resource Strain: Low Risk    Difficulty of Paying Living Expenses: Not hard at all  Food Insecurity: No Food Insecurity   Worried About Charity fundraiser in the Last Year: Never true   [place] in the Last Year: Never true  Transportation Needs: No Transportation Needs   Lack of Transportation (Medical): No   Lack of Transportation (Non-Medical): No  Physical Activity: Insufficiently Active   Days of Exercise per Week: 7  days   Minutes of Exercise per Session: 20 min  Stress: No Stress Concern Present   Feeling of Stress : Only a little  Social Connections: Moderately Isolated   Frequency of Communication with Friends and Family: More than three times a week   Frequency of Social Gatherings with Friends and Family: More than three times a week   Attends Religious Services: Never   Marine scientist or Organizations: Yes   Attends Music therapist: 1 to 4 times per year   Marital Status: Divorced  Human resources officer Violence: Not At Risk   Fear of Current or Ex-Partner: No   Emotionally Abused: No   Physically Abused: No   Sexually Abused: No    FAMILY HISTORY: Family History  Problem Relation Age of Onset   Heart disease Mother    Diabetes Mother    Arthritis Mother    Vascular Disease Mother     Diabetes Sister    Hypertension Sister    Arthritis Sister    Early death Brother    Vascular Disease Brother    Heart attack Brother    Arthritis Father    Heart attack Brother    Diabetes Sister    Hypertension Sister    Arthritis Sister     ALLERGIES:  is allergic to celebrex [celecoxib], demerol [meperidine], lisinopril, statins, and zyrtec [cetirizine].  MEDICATIONS:  No current facility-administered medications for this visit.   No current outpatient medications on file.   Facility-Administered Medications Ordered in Other Visits  Medication Dose Route Frequency Provider Last Rate Last Admin   0.9 %  sodium chloride infusion   Intravenous Continuous Jennye Boroughs, MD 100 mL/hr at 06/05/21 1801 Rate Change at 06/05/21 1801   acetaminophen (TYLENOL) tablet 650 mg  650 mg Oral Q6H PRN Bernadette Hoit, DO       Or   acetaminophen (TYLENOL) suppository 650 mg  650 mg Rectal Q6H PRN Adefeso, Oladapo, DO       aspirin tablet 325 mg  325 mg Oral Daily Adefeso, Oladapo, DO   325 mg at 06/05/21 1141   heparin ADULT infusion 100 units/mL (25000 units/249m)  1,250 Units/hr Intravenous Continuous Adefeso, Oladapo, DO 12.5 mL/hr at 06/05/21 1811 1,250 Units/hr at 06/05/21 1811   HYDROmorphone (DILAUDID) injection 0.5 mg  0.5 mg Intravenous Q3H PRN Adefeso, Oladapo, DO   0.5 mg at 06/05/21 0644   methocarbamol (ROBAXIN) 500 mg in dextrose 5 % 50 mL IVPB  500 mg Intravenous Q6H PRN Adefeso, Oladapo, DO       oxyCODONE (Oxy IR/ROXICODONE) immediate release tablet 5 mg  5 mg Oral Q4H PRN Adefeso, Oladapo, DO   5 mg at 06/05/21 1141    REVIEW OF SYSTEMS:   Review of Systems - Oncology   PHYSICAL EXAMINATION: ECOG PERFORMANCE STATUS: {CHL ONC ECOG PS:[phone]}  There were no vitals filed for this visit. There were no vitals filed for this visit. Physical Exam   LABORATORY DATA:  I have reviewed the data as listed CBC Latest Ref Rng & Units 06/05/2021 06/04/2021 05/06/2021  WBC 4.0  - 10.5 K/uL 6.4 9.2 6.8  Hemoglobin 12.0 - 15.0 g/dL 8.9(L) 9.2(L) 9.8(L)  Hematocrit 36.0 - 46.0 % 28.3(L) 28.9(L) 29.6(L)  Platelets 150 - 400 K/uL 161 170 250   CMP Latest Ref Rng & Units 06/05/2021 06/04/2021 05/06/2021  Glucose 70 - 99 mg/dL 115(H) 106(H) 116(H)  BUN 8 - 23 mg/dL 21 25(H) 13  Creatinine 0.44 - 1.00 mg/dL 0.81  0.89 0.73  Sodium 135 - 145 mmol/L 136 134(L) 138  Potassium 3.5 - 5.1 mmol/L 3.9 4.2 3.7  Chloride 98 - 111 mmol/L 107 103 103  CO2 22 - 32 mmol/L '23 22 22  ' Calcium 8.9 - 10.3 mg/dL 12.1(H) 12.6(H) 10.9(H)  Total Protein 6.5 - 8.1 g/dL 5.6(L) 6.7 6.2  Total Bilirubin 0.3 - 1.2 mg/dL 0.6 0.9 <0.2  Alkaline Phos 38 - 126 U/L 53 58 47  AST 15 - 41 U/L 32 42(H) 15  ALT 0 - 44 U/L 39 46(H) 10    RADIOGRAPHIC STUDIES: I have personally reviewed the radiological images as listed and agreed with the findings in the report. CT Angio Chest PE W and/or Wo Contrast  Result Date: 06/04/2021 CLINICAL DATA:  Epigastric pain.  Pancreatic malignancy. EXAM: CT ANGIOGRAPHY CHEST CT ABDOMEN AND PELVIS WITH CONTRAST TECHNIQUE: Multidetector CT imaging of the chest was performed using the standard protocol during bolus administration of intravenous contrast. Multiplanar CT image reconstructions and MIPs were obtained to evaluate the vascular anatomy. Multidetector CT imaging of the abdomen and pelvis was performed using the standard protocol during bolus administration of intravenous contrast. CONTRAST:  69m OMNIPAQUE IOHEXOL 350 MG/ML SOLN COMPARISON:  CT renal stone 05/21/2021. FINDINGS: CTA CHEST FINDINGS Cardiovascular: The heart and aorta are normal in size. There are atherosclerotic calcifications of the aorta. There is no pericardial effusion. There is adequate opacification of the pulmonary arteries to the segmental level. There are segmental and subsegmental right lower lobe pulmonary emboli. There are subsegmental right middle lobe pulmonary emboli. Mediastinum/Nodes: There is a  moderate hiatal hernia. The esophagus is nondilated. There are no enlarged mediastinal or hilar lymph nodes. Lungs/Pleura: There is a trace right pleural effusion. There is bilateral lower lung atelectasis. There is a small amount of patchy airspace and ground-glass opacity in the right lower lobe. Trachea and central airways are patent. No pneumothorax. No suspicious pulmonary nodules. Musculoskeletal: No chest wall abnormality. No acute or significant osseous findings. There is moderate compression fracture of T10 which is new from the prior examination. No axillary lymphadenopathy. Review of the MIP images confirms the above findings. CT ABDOMEN and PELVIS FINDINGS Hepatobiliary: There is a 15 mm hypodense lesion in the left lobe of the liver image 2/19. There is a similar appearing hypodense lesion measuring 2 cm in the left lobe of the liver image 2/32. Small left hepatic cyst in the left lobe measures 9 mm. No gallstones are identified. Gallbladder sludge is likely present. No definite biliary ductal dilatation. Pancreas: Lobulated low-density mass in the region of the body and tail the pancreas measures 5.8 x 11.4 x 5.5 cm. Low-attenuation lesion in the head of the pancreas versus dilated duct seen on image 2/26 measuring 15 mm. There is loss of fat plane between the lesser curvature of the stomach. This mass encompasses the celiac axis and its proximal branches, possibly causing occlusion. Superior mesenteric vein not definitely involved. 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 Splenic vein not visualized and may be occluded as well. Spleen: There is small wedge-shaped area of hypodensity in the spleen suspicious for infarct. No perisplenic fluid collection identified. The spleen is nonenlarged. Adrenals/Urinary Tract: The bilateral adrenal glands are within normal limits. There are punctate nonobstructing right renal calculi. There is no  hydronephrosis in either kidney. There is a 5.2 by 3.8 cm cyst in the right kidney. There is an additional cortical hypodensity in  the right kidney which is too small to characterize image 2/23. This likely also represents cyst. Bladder is grossly within normal limits, but evaluation is limited secondary to streak artifact in the pelvis. Stomach/Bowel: No evidence for bowel obstruction, free air or pneumatosis. There is a large amount of stool throughout the colon. The appendix is not seen. Small bowel loops are within normal limits. Pancreatic mass abuts the body of the stomach with loss of fat plane. The stomach is otherwise normal. There is a moderate-sized hiatal hernia. Vascular/Lymphatic: Aorta and IVC are normal in size. There are atherosclerotic calcifications of the aorta. No discrete enlarged lymph nodes are identified. Reproductive: Uterus and bilateral adnexa are unremarkable. Other: There is a small amount of free fluid in the pelvis. There is a small fat containing umbilical hernia. Musculoskeletal: Degenerative changes affect the spine. No acute fractures are seen. No focal osseous lesions are seen. Right hip arthroplasty is present. Review of the MIP images confirms the above findings. IMPRESSION: 1. Segmental and subsegmental right lower lobe pulmonary emboli. Subsegmental right middle lobe pulmonary emboli. (RV/LV Ratio = 1.0) 2. Trace right pleural effusion. 3. Minimal patchy airspace disease and ground-glass opacities in the right lower lobe suspicious for infection. 4. T10 compression fracture is new from May 21, 2021. 5. Large mass involving the pancreatic body and tail most compatible with primary pancreatic neoplasm. Questionable ductal dilatation in the head of the pancreas versus second smaller lesion. 6. 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 7. Celiac artery and its branches appear encompassed by tumor and may be occluded or partially  occluded. 8. Likely small splenic infarct. 9. There are 2 suspicious lesions in the left lobe of the liver, likely metastatic disease. 10. Nonobstructing right renal calculi. 11. Small amount of free fluid in the pelvis. 12. Large stool burden. 13.  Aortic Atherosclerosis (ICD10-I70.0). These results were called by telephone at the time of interpretation on 06/04/2021 at 7:28 pm to provider Wynona Dove , who verbally acknowledged these results. Electronically Signed   By: Ronney Asters M.D.   On: 06/04/2021 19:32   CT ABDOMEN PELVIS W CONTRAST  Result Date: 06/04/2021 CLINICAL DATA:  Epigastric pain.  Pancreatic malignancy. EXAM: CT ANGIOGRAPHY CHEST CT ABDOMEN AND PELVIS WITH CONTRAST TECHNIQUE: Multidetector CT imaging of the chest was performed using the standard protocol during bolus administration of intravenous contrast. Multiplanar CT image reconstructions and MIPs were obtained to evaluate the vascular anatomy. Multidetector CT imaging of the abdomen and pelvis was performed using the standard protocol during bolus administration of intravenous contrast. CONTRAST:  34m OMNIPAQUE IOHEXOL 350 MG/ML SOLN COMPARISON:  CT renal stone 05/21/2021. FINDINGS: CTA CHEST FINDINGS Cardiovascular: The heart and aorta are normal in size. There are atherosclerotic calcifications of the aorta. There is no pericardial effusion. There is adequate opacification of the pulmonary arteries to the segmental level. There are segmental and subsegmental right lower lobe pulmonary emboli. There are subsegmental right middle lobe pulmonary emboli. Mediastinum/Nodes: There is a moderate hiatal hernia. The esophagus is nondilated. There are no enlarged mediastinal or hilar lymph nodes. Lungs/Pleura: There is a trace right pleural effusion. There is bilateral lower lung atelectasis. There is a small amount of patchy airspace and ground-glass opacity in the right lower lobe. Trachea and central airways are patent. No pneumothorax. No  suspicious pulmonary nodules. Musculoskeletal: No chest wall abnormality. No acute or significant osseous findings. There is moderate compression fracture of T10 which is new from the prior examination.  No axillary lymphadenopathy. Review of the MIP images confirms the above findings. CT ABDOMEN and PELVIS FINDINGS Hepatobiliary: There is a 15 mm hypodense lesion in the left lobe of the liver image 2/19. There is a similar appearing hypodense lesion measuring 2 cm in the left lobe of the liver image 2/32. Small left hepatic cyst in the left lobe measures 9 mm. No gallstones are identified. Gallbladder sludge is likely present. No definite biliary ductal dilatation. Pancreas: Lobulated low-density mass in the region of the body and tail the pancreas measures 5.8 x 11.4 x 5.5 cm. Low-attenuation lesion in the head of the pancreas versus dilated duct seen on image 2/26 measuring 15 mm. There is loss of fat plane between the lesser curvature of the stomach. This mass encompasses the celiac axis and its proximal branches, possibly causing occlusion. Superior mesenteric vein not definitely involved. 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 Splenic vein not visualized and may be occluded as well. Spleen: There is small wedge-shaped area of hypodensity in the spleen suspicious for infarct. No perisplenic fluid collection identified. The spleen is nonenlarged. Adrenals/Urinary Tract: The bilateral adrenal glands are within normal limits. There are punctate nonobstructing right renal calculi. There is no hydronephrosis in either kidney. There is a 5.2 by 3.8 cm cyst in the right kidney. There is an additional cortical hypodensity in the right kidney which is too small to characterize image 2/23. This likely also represents cyst. Bladder is grossly within normal limits, but evaluation is limited secondary to streak artifact in the pelvis. Stomach/Bowel: No  evidence for bowel obstruction, free air or pneumatosis. There is a large amount of stool throughout the colon. The appendix is not seen. Small bowel loops are within normal limits. Pancreatic mass abuts the body of the stomach with loss of fat plane. The stomach is otherwise normal. There is a moderate-sized hiatal hernia. Vascular/Lymphatic: Aorta and IVC are normal in size. There are atherosclerotic calcifications of the aorta. No discrete enlarged lymph nodes are identified. Reproductive: Uterus and bilateral adnexa are unremarkable. Other: There is a small amount of free fluid in the pelvis. There is a small fat containing umbilical hernia. Musculoskeletal: Degenerative changes affect the spine. No acute fractures are seen. No focal osseous lesions are seen. Right hip arthroplasty is present. Review of the MIP images confirms the above findings. IMPRESSION: 1. Segmental and subsegmental right lower lobe pulmonary emboli. Subsegmental right middle lobe pulmonary emboli. (RV/LV Ratio = 1.0) 2. Trace right pleural effusion. 3. Minimal patchy airspace disease and ground-glass opacities in the right lower lobe suspicious for infection. 4. T10 compression fracture is new from May 21, 2021. 5. Large mass involving the pancreatic body and tail most compatible with primary pancreatic neoplasm. Questionable ductal dilatation in the head of the pancreas versus second smaller lesion. 6. 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 7. 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 8. Likely small splenic infarct. 9. There are 2 suspicious lesions in the left lobe of the liver, likely metastatic disease. 10. Nonobstructing right renal calculi. 11. Small amount of free fluid in the pelvis. 12. Large stool burden. 13.  Aortic Atherosclerosis (ICD10-I70.0). These results were called by telephone at the time of interpretation on  06/04/2021 at 7:28 pm to provider Wynona Dove , who verbally acknowledged these results. Electronically Signed   By: Ronney Asters  M.D.   On: 06/04/2021 19:32   CT T-SPINE NO CHARGE  Result Date: 06/04/2021 CLINICAL DATA:  Initial evaluation for acute trauma, fall. EXAM: CT Thoracic and Lumbar spine  Contrast TECHNIQUE: Multiplanar CT images of the thoracic and lumbar spine were reconstructed from contemporary CT of the Chest, Abdomen, and Pelvis CONTRAST:  None or No additional COMPARISON:  Comparison made with concomitant CT of the chest, abdomen, and pelvis. FINDINGS: CT THORACIC SPINE FINDINGS Alignment: Exaggeration of the normal thoracic kyphosis. No listhesis. Vertebrae: There is an acute compression fracture involving the T10 vertebral body. Associated height loss measures up to 50% with 3 mm bony retropulsion. No significant stenosis. Otherwise, vertebral body height maintained with no other acute or chronic fracture. Visualized ribs intact. No worrisome osseous lesions. Paraspinal and other soft tissues: Paraspinous soft tissues demonstrate no acute finding. Small layering right pleural effusion with associated atelectasis. Previously identified pancreatic neoplasm partially visualize, better evaluated on concomitant CT of the abdomen and pelvis. Hiatal hernia noted. Scattered aortic atherosclerosis. Acute pulmonary embolism within right lower lobe segmental branches, also better evaluated on concomitant chest CT. Disc levels: No significant disc pathology or stenosis seen within the thoracic spine. CT LUMBAR SPINE FINDINGS Segmentation: Standard. Lowest well-formed disc space labeled the L5-S1 level. Alignment: Moderate levoscoliosis with apex at L2-3. Alignment otherwise normal preservation of the normal lumbar lordosis. Vertebrae: Vertebral body height maintained without acute or chronic fracture. Visualized sacrum and pelvis intact. No worrisome osseous lesions. Paraspinal and other soft tissues:  Paraspinous soft tissues demonstrate no acute finding. 5.4 cm parapelvic cyst noted within the right kidney. Nonobstructive right renal nephrolithiasis noted. Aorto bi-iliac atherosclerotic disease. Disc levels: Mild multilevel degenerative spondylosis, most pronounced at L1-2, L4-5, and L5-S1. Lower lumbar facet hypertrophy. No significant spinal stenosis. Moderate bilateral L5 foraminal narrowing. IMPRESSION: CT THORACIC SPINE IMPRESSION: 1. Compression fracture involving the T10 vertebral body with up to 50% height loss and 3 mm bony retropulsion, new as compared to 05/21/2021, consistent with an acute/subacute finding. No significant stenosis. 2. No other acute traumatic injury within the thoracic spine. 3. Acute right-sided pulmonary emboli, better evaluated on concomitant CT of the chest. 4. Small layering right pleural effusion with associated atelectasis. CT LUMBAR SPINE IMPRESSION: 1. No acute traumatic injury within the lumbar spine. 2. Moderate levoscoliosis with apex at L2-3. 3. Mild multilevel degenerative spondylosis, most pronounced at L1-2, L4-5, and L5-S1. Moderate bilateral L5 foraminal narrowing. 4. Nonobstructive right renal nephrolithiasis. 5. Aortic Atherosclerosis (ICD10-I70.0). Results regarding the acute pulmonary emboli were communicated to the ordering clinician by Dr. Ronney Asters, documented on corresponding CTA of the chest. Electronically Signed   By: Jeannine Boga M.D.   On: 06/04/2021 19:45   CT L-SPINE NO CHARGE  Result Date: 06/04/2021 CLINICAL DATA:  Initial evaluation for acute trauma, fall. EXAM: CT Thoracic and Lumbar spine  Contrast TECHNIQUE: Multiplanar CT images of the thoracic and lumbar spine were reconstructed from contemporary CT of the Chest, Abdomen, and Pelvis CONTRAST:  None or No additional COMPARISON:  Comparison made with concomitant CT of the chest, abdomen, and pelvis. FINDINGS: CT THORACIC SPINE FINDINGS Alignment: Exaggeration of the normal thoracic  kyphosis. No listhesis. Vertebrae: There is an acute compression fracture involving the T10 vertebral body. Associated height loss measures up to 50% with 3 mm bony retropulsion. No significant stenosis. Otherwise, vertebral body height maintained with no other acute or chronic fracture. Visualized ribs intact. No worrisome osseous lesions. Paraspinal and other soft tissues: Paraspinous soft tissues demonstrate no  acute finding. Small layering right pleural effusion with associated atelectasis. Previously identified pancreatic neoplasm partially visualize, better evaluated on concomitant CT of the abdomen and pelvis. Hiatal hernia noted. Scattered aortic atherosclerosis. Acute pulmonary embolism within right lower lobe segmental branches, also better evaluated on concomitant chest CT. Disc levels: No significant disc pathology or stenosis seen within the thoracic spine. CT LUMBAR SPINE FINDINGS Segmentation: Standard. Lowest well-formed disc space labeled the L5-S1 level. Alignment: Moderate levoscoliosis with apex at L2-3. Alignment otherwise normal preservation of the normal lumbar lordosis. Vertebrae: Vertebral body height maintained without acute or chronic fracture. Visualized sacrum and pelvis intact. No worrisome osseous lesions. Paraspinal and other soft tissues: Paraspinous soft tissues demonstrate no acute finding. 5.4 cm parapelvic cyst noted within the right kidney. Nonobstructive right renal nephrolithiasis noted. Aorto bi-iliac atherosclerotic disease. Disc levels: Mild multilevel degenerative spondylosis, most pronounced at L1-2, L4-5, and L5-S1. Lower lumbar facet hypertrophy. No significant spinal stenosis. Moderate bilateral L5 foraminal narrowing. IMPRESSION: CT THORACIC SPINE IMPRESSION: 1. Compression fracture involving the T10 vertebral body with up to 50% height loss and 3 mm bony retropulsion, new as compared to 05/21/2021, consistent with an acute/subacute finding. No significant stenosis.  2. No other acute traumatic injury within the thoracic spine. 3. Acute right-sided pulmonary emboli, better evaluated on concomitant CT of the chest. 4. Small layering right pleural effusion with associated atelectasis. CT LUMBAR SPINE IMPRESSION: 1. No acute traumatic injury within the lumbar spine. 2. Moderate levoscoliosis with apex at L2-3. 3. Mild multilevel degenerative spondylosis, most pronounced at L1-2, L4-5, and L5-S1. Moderate bilateral L5 foraminal narrowing. 4. Nonobstructive right renal nephrolithiasis. 5. Aortic Atherosclerosis (ICD10-I70.0). Results regarding the acute pulmonary emboli were communicated to the ordering clinician by Dr. Ronney Asters, documented on corresponding CTA of the chest. Electronically Signed   By: Jeannine Boga M.D.   On: 06/04/2021 19:45   DG Chest Port 1 View  Result Date: 06/04/2021 CLINICAL DATA:  Shortness of breath.  Back pain. EXAM: PORTABLE CHEST 1 VIEW COMPARISON:  Two-view chest x-ray 01/22/2016 FINDINGS: Heart size upper limits of normal, exaggerated by low lung volumes. Atherosclerotic changes are present in the arch. Mild atelectasis or scarring is present both bases. No edema or effusion is present airspace consolidation is present. Bony thorax is within normal limits. IMPRESSION: 1. Low lung volumes. 2. Mild bibasilar atelectasis or scarring. 3. Atherosclerosis. Electronically Signed   By: San Morelle M.D.   On: 06/04/2021 17:44   ECHOCARDIOGRAM COMPLETE  Result Date: 06/05/2021    ECHOCARDIOGRAM REPORT   Patient Name:   MANPREET KEMMER Date of Exam: 06/05/2021 Medical Rec #:  [id number]         Height:       62.0 in Accession #:    [id number]        Weight:       137.6 lb Date of Birth:  December 22, 1940         BSA:          1.631 m Patient Age:    12 years          BP:           136/52 mmHg Patient Gender: F                 HR:           88 bpm. Exam Location:  [facility] Procedure: 2D Echo, Cardiac Doppler and Color Doppler Indications:     Pulmonary Embolus  History:  Patient has prior history of Echocardiogram examinations, most                 recent 10/15/2012. TIA, Arrythmias:Bradycardia; Risk                 Factors:Hypertension, Dyslipidemia and Former Smoker.  Sonographer:    Wenda Low Referring Phys: [id number] OLADAPO ADEFESO IMPRESSIONS  1. Left ventricular ejection fraction, by estimation, is 60 to 65%. The left ventricle has normal function. The left ventricle has no regional wall motion abnormalities. Left ventricular diastolic parameters are consistent with Grade I diastolic dysfunction (impaired relaxation).  2. Right ventricular systolic function is normal. The right ventricular size is normal. Tricuspid regurgitation signal is inadequate for assessing PA pressure.  3. Left atrial size was mildly dilated.  4. The mitral valve is normal in structure. No evidence of mitral valve regurgitation. No evidence of mitral stenosis.  5. The aortic valve is tricuspid. Aortic valve regurgitation is trivial. No aortic stenosis is present. FINDINGS  Left Ventricle: Left ventricular ejection fraction, by estimation, is 60 to 65%. The left ventricle has normal function. The left ventricle has no regional wall motion abnormalities. The left ventricular internal cavity size was normal in size. There is  no left ventricular hypertrophy. Left ventricular diastolic parameters are consistent with Grade I diastolic dysfunction (impaired relaxation). Normal left ventricular filling pressure. Right Ventricle: The right ventricular size is normal. No increase in right ventricular wall thickness. Right ventricular systolic function is normal. Tricuspid regurgitation signal is inadequate for assessing PA pressure. Left Atrium: Left atrial size was mildly dilated. Right Atrium: Right atrial size was normal in size. Pericardium: There is no evidence of pericardial effusion. Mitral Valve: The mitral valve is normal in structure. No evidence of mitral valve  regurgitation. No evidence of mitral valve stenosis. MV peak gradient, 4.5 mmHg. The mean mitral valve gradient is 2.0 mmHg. Tricuspid Valve: The tricuspid valve is normal in structure. Tricuspid valve regurgitation is trivial. No evidence of tricuspid stenosis. Aortic Valve: The aortic valve is tricuspid. Aortic valve regurgitation is trivial. No aortic stenosis is present. Aortic valve mean gradient measures 4.0 mmHg. Aortic valve peak gradient measures 10.6 mmHg. Aortic valve area, by VTI measures 2.10 cm. Pulmonic Valve: The pulmonic valve was not well visualized. Pulmonic valve regurgitation is not visualized. No evidence of pulmonic stenosis. Aorta: The aortic root is normal in size and structure. Venous: The inferior vena cava was not well visualized. IAS/Shunts: The interatrial septum was not well visualized.  LEFT VENTRICLE PLAX 2D LVIDd:         4.40 cm     Diastology LVIDs:         2.90 cm     LV e' medial:    7.40 cm/s LV PW:         1.00 cm     LV E/e' medial:  9.7 LV IVS:        1.00 cm     LV e' lateral:   5.66 cm/s LVOT diam:     1.90 cm     LV E/e' lateral: 12.7 LV SV:         64 LV SV Index:   39 LVOT Area:     2.84 cm  LV Volumes (MOD) LV vol d, MOD A2C: 40.0 ml LV vol d, MOD A4C: 33.3 ml LV vol s, MOD A2C: 17.2 ml LV vol s, MOD A4C: 14.1 ml LV SV MOD A2C:     22.8 ml  LV SV MOD A4C:     33.3 ml LV SV MOD BP:      21.6 ml RIGHT VENTRICLE RV Basal diam:  3.25 cm RV Mid diam:    2.90 cm RV S prime:     14.10 cm/s TAPSE (M-mode): 3.7 cm LEFT ATRIUM             Index        RIGHT ATRIUM           Index LA diam:        4.10 cm 2.51 cm/m   RA Area:     11.90 cm LA Vol (A2C):   60.8 ml 37.29 ml/m  RA Volume:   27.50 ml  16.86 ml/m LA Vol (A4C):   57.4 ml 35.20 ml/m LA Biplane Vol: 59.4 ml 36.43 ml/m  AORTIC VALVE                    PULMONIC VALVE AV Area (Vmax):    2.10 cm     PV Vmax:       1.14 m/s AV Area (Vmean):   2.06 cm     PV Peak grad:  5.2 mmHg AV Area (VTI):     2.10 cm AV Vmax:            163.00 cm/s AV Vmean:          92.700 cm/s AV VTI:            0.306 m AV Peak Grad:      10.6 mmHg AV Mean Grad:      4.0 mmHg LVOT Vmax:         121.00 cm/s LVOT Vmean:        67.400 cm/s LVOT VTI:          0.227 m LVOT/AV VTI ratio: 0.74  AORTA Ao Root diam: 3.00 cm MITRAL VALVE MV Area (PHT): 3.33 cm    SHUNTS MV Area VTI:   2.43 cm    Systemic VTI:  0.23 m MV Peak grad:  4.5 mmHg    Systemic Diam: 1.90 cm MV Mean grad:  2.0 mmHg MV Vmax:       1.06 m/s MV Vmean:      56.4 cm/s MV Decel Time: 228 msec MV E velocity: 71.60 cm/s MV A velocity: 97.70 cm/s MV E/A ratio:  0.73 Carlyle Dolly MD Electronically signed by Carlyle Dolly MD Signature Date/Time: 06/05/2021/3:20:32 PM    Final    CT RENAL STONE STUDY  Result Date: 05/21/2021 CLINICAL DATA:  Left flank and left upper quadrant pain, decreased appetite for 3 weeks EXAM: CT ABDOMEN AND PELVIS WITHOUT CONTRAST TECHNIQUE: Multidetector CT imaging of the abdomen and pelvis was performed following the standard protocol without IV contrast. Unenhanced CT was performed per clinician order. Lack of IV contrast limits sensitivity and specificity, especially for evaluation of abdominal/pelvic solid viscera. COMPARISON:  05/06/2021, 07/04/2005 FINDINGS: Lower chest: No acute pleural or parenchymal lung disease. Hepatobiliary: Lack of IV contrast limits evaluation of the liver parenchyma. There are indeterminate hypodensities identified within the left lobe liver image 18/2 measuring 2.0 cm as well as on image 31/2 measuring 1.8 cm. These are concerning for metastatic disease. Likely 1.2 cm cyst within the left lobe liver also seen on image 31/2. Gallbladder is decompressed, with no evidence of cholelithiasis or cholecystitis. Pancreas: There is a large complex mass involving pancreatic body measuring up to 7.1 x 4.1 cm reference image 23/2. There is  likely involvement of the adjacent vasculature, though evaluation is severely limited without IV contrast.  Pancreatic malignancy is the diagnosis of exclusion, and dedicated pancreatic CT or MRI is recommended for complete characterization. Spleen: Unremarkable unenhanced appearance. Adrenals/Urinary Tract: 5.3 x 4.1 cm peripelvic cyst lower pole right kidney. Small a peripelvic cysts are seen on the left. There are nonobstructing right renal calculi measuring up to 6 mm. No obstructive uropathy within either kidney. Bladder is decompressed, limiting its evaluation. The adrenals are unremarkable. Stomach/Bowel: No bowel obstruction or ileus. Moderate retained stool throughout the colon. There is loss of normal fat plane between the pancreatic body mass and the greater curvature of the stomach, and direct invasion cannot be excluded. Vascular/Lymphatic: Evaluation of the vessels is limited by lack of IV contrast. As discussed above, there is likely involvement of the splenic vessels and possibly a portal vein by the large pancreatic mass. Multiple subcentimeter lymph nodes are seen within the central upper mesentery. I do not see any discrete pathologic adenopathy however. There is diffuse atherosclerosis of the aorta and its branches. Evaluation of the vascular lumen is limited without IV contrast. Reproductive: Uterus and bilateral adnexa are unremarkable. Other: There is trace free fluid along the greater curvature of the stomach in the fundus. No free intraperitoneal gas. No abdominal wall hernia. Musculoskeletal: There are no acute or destructive bony lesions. Reconstructed images demonstrate no additional findings. IMPRESSION: 1. Large 7.1 cm mass centered in the pancreatic body highly concerning for pancreatic malignancy. This mass likely involves the greater curvature of the stomach and the adjacent portal and splenic vessels. Dedicated pancreatic CT or MRI is recommended with without contrast for further evaluation. 2. Suspected hypodense metastatic lesions within the left lobe liver as above. These could also be  evaluated at the time of follow-up imaging. 3. Subcentimeter mesenteric lymph nodes concerning for regional metastatic disease. No pathologic adenopathy. 4. Nonobstructing right renal calculi. 5.  Aortic Atherosclerosis (ICD10-I70.0). These results will be called to the ordering clinician or representative by the Radiologist Assistant, and communication documented in the PACS or [employer]. Electronically Signed   By: Randa Ngo M.D.   On: 05/21/2021 09:34    ASSESSMENT:  ***   PLAN:  ***   All questions were answered. The patient knows to call the clinic with any problems, questions or concerns.   Derek Jack, MD, 06/05/21 7:17 PM  [facility] [phone]   I, ***, am acting as a scribe for Dr. Derek Jack.  {Add Barista Statement}

## 2021-06-05 NOTE — Progress Notes (Incomplete)
*  PRELIMINARY RESULTS* Echocardiogram 2D Echocardiogram has been performed.  Andrea Moreno 06/05/2021, 2:12 PM

## 2021-06-05 NOTE — Progress Notes (Signed)
ANTICOAGULATION CONSULT NOTE Pharmacy Consult for Heparin Indication: VTE Brief A/P: Heparin level subtherapeutic Rebolus, increase Heparin rate  Allergies  Allergen Reactions   Celebrex [Celecoxib]     rash   Demerol [Meperidine] Hypertension   Lisinopril     Cough   Statins     Muscle aches   Zyrtec [Cetirizine] Itching    Patient Measurements: Height: 5\' 2"  (157.5 cm) Weight: 62.4 kg (137 lb 9.1 oz) IBW/kg (Calculated) : 50.1 Heparin Dosing Weight: 63 kg  Vital Signs: Temp: 98.1 F (36.7 C) (01/04 0300) Temp Source: Oral (01/04 0300) BP: 121/99 (01/04 0300) Pulse Rate: 80 (01/04 0300)  Labs: Recent Labs    06/04/21 1645 06/04/21 1943 06/04/21 2343 06/05/21 0330  HGB 9.2*  --   --  8.9*  HCT 28.9*  --   --  28.3*  PLT 170  --   --  161  APTT  --   --   --  33  HEPARINUNFRC  --   --   --  <0.10*  CREATININE 0.89  --   --  0.81  TROPONINIHS 59* 60* 52* 42*     Estimated Creatinine Clearance: 48.1 mL/min (by C-G formula based on SCr of 0.81 mg/dL).  Assessment: 81 y.o. female with new PE, portal, splenic, and SMV thrombosis, for heparin.  Heparin level subtherapeutic   Goal of Therapy:  Heparin level 0.3-0.7 units/ml Monitor platelets by anticoagulation protocol: Yes   Plan:  Heparin 2000 units IV bolus, then increase heparin  1250 units/hr Check heparin level in 6 hours.  Phillis Knack, PharmD, BCPS  06/05/2021,6:16 AM

## 2021-06-05 NOTE — Progress Notes (Signed)
Patient was very confused last night, pulling out her IV. This RN was able to redirect her. We continue to monitor.

## 2021-06-05 NOTE — Progress Notes (Signed)
°  Transition of Care [facility]) Screening Note   Patient Details  Name: Andrea Moreno Date of Birth: Oct 22, 1940   Transition of [facility]) CM/SW Contact:    Boneta Lucks, RN Phone Number: 06/05/2021, 3:29 PM  PT eval pending.   Transition of Care Department [facility]) has reviewed patient and no TOC needs have been identified at this time. We will continue to monitor patient advancement through interdisciplinary progression rounds. If new patient transition needs arise, please place a TOC consult.

## 2021-06-05 NOTE — Progress Notes (Signed)
PT Cancellation Note  Patient Details Name: Andrea Moreno MRN: [id number] DOB: 1941-04-28   Cancelled Treatment:    Reason Eval/Treat Not Completed: Medical issues which prohibited therapy.  Patient presents with heparin levels outside therapeutic range. Pt also has not been on heparin drip for 24 hours. Will attempt to see pt once it has been 24 hours since start of heparin drip.    2:54 PM, 06/05/21 Lonell Grandchild, MPT Physical Therapist with [facility] 336 [phone] office [phone] mobile phone

## 2021-06-06 ENCOUNTER — Ambulatory Visit (HOSPITAL_COMMUNITY): Payer: Medicare Other | Admitting: Hematology

## 2021-06-06 DIAGNOSIS — I2699 Other pulmonary embolism without acute cor pulmonale: Secondary | ICD-10-CM | POA: Diagnosis not present

## 2021-06-06 DIAGNOSIS — G8929 Other chronic pain: Secondary | ICD-10-CM | POA: Diagnosis not present

## 2021-06-06 DIAGNOSIS — K769 Liver disease, unspecified: Secondary | ICD-10-CM | POA: Diagnosis not present

## 2021-06-06 DIAGNOSIS — Z7189 Other specified counseling: Secondary | ICD-10-CM

## 2021-06-06 DIAGNOSIS — S22070A Wedge compression fracture of T9-T10 vertebra, initial encounter for closed fracture: Secondary | ICD-10-CM | POA: Diagnosis not present

## 2021-06-06 DIAGNOSIS — Z515 Encounter for palliative care: Secondary | ICD-10-CM

## 2021-06-06 DIAGNOSIS — K8689 Other specified diseases of pancreas: Secondary | ICD-10-CM

## 2021-06-06 LAB — BASIC METABOLIC PANEL
Anion gap: 9 (ref 5–15)
BUN: 14 mg/dL (ref 8–23)
CO2: 21 mmol/L — ABNORMAL LOW (ref 22–32)
Calcium: 12.1 mg/dL — ABNORMAL HIGH (ref 8.9–10.3)
Chloride: 105 mmol/L (ref 98–111)
Creatinine, Ser: 0.76 mg/dL (ref 0.44–1.00)
GFR, Estimated: >60 mL/min (ref >60)
Glucose, Bld: 97 mg/dL (ref 70–99)
Potassium: 3.8 mmol/L (ref 3.5–5.1)
Sodium: 135 mmol/L (ref 135–145)

## 2021-06-06 LAB — CBC
HCT: 29.2 % — ABNORMAL LOW (ref 36.0–46.0)
Hemoglobin: 9.2 g/dL — ABNORMAL LOW (ref 12.0–15.0)
MCH: 28 pg (ref 26.0–34.0)
MCHC: 31.5 g/dL (ref 30.0–36.0)
MCV: 88.8 fL (ref 80.0–100.0)
Platelets: 188 10*3/uL (ref 150–400)
RBC: 3.29 MIL/uL — ABNORMAL LOW (ref 3.87–5.11)
RDW: 15.5 % (ref 11.5–15.5)
WBC: 10.9 10*3/uL — ABNORMAL HIGH (ref 4.0–10.5)
nRBC: 0 % (ref 0.0–0.2)

## 2021-06-06 LAB — HEPARIN LEVEL (UNFRACTIONATED): Heparin Unfractionated: 0.54 IU/mL (ref 0.30–0.70)

## 2021-06-06 MED ORDER — APIXABAN 5 MG PO TABS: 5.0000 mg | ORAL_TABLET | Freq: Two times a day (BID) | ORAL | Status: DC

## 2021-06-06 MED ORDER — SODIUM CHLORIDE 0.9 % IV SOLN
4.0000 mg | Freq: Once | INTRAVENOUS | Status: AC
  Administered 2021-06-06: 4 mg via INTRAVENOUS
  Filled 2021-06-06: qty 5

## 2021-06-06 MED ORDER — APIXABAN 5 MG PO TABS
10.0000 mg | ORAL_TABLET | Freq: Two times a day (BID) | ORAL | Status: DC
  Administered 2021-06-06 – 2021-06-10 (×8): 10 mg via ORAL
  Filled 2021-06-06 (×8): qty 2

## 2021-06-06 MED ORDER — KCL IN DEXTROSE-NACL 20-5-0.9 MEQ/L-%-% IV SOLN: INTRAVENOUS | Status: AC

## 2021-06-06 NOTE — Plan of Care (Signed)
  Problem: Education: Goal: Knowledge of General Education information will improve Description Including pain rating scale, medication(s)/side effects and non-pharmacologic comfort measures Outcome: Progressing   

## 2021-06-06 NOTE — Consult Note (Signed)
[facility] Consultation Oncology  Name: Andrea Moreno      MRN: [id number]    Location: W098/J191-47  Date: 06/06/2021 Time:5:08 PM   REFERRING PHYSICIAN: Dr. Mal Misty  REASON FOR CONSULT: Highly probable metastatic pancreatic cancer    HISTORY OF PRESENT ILLNESS: She is a 81 year old very pleasant white female seen in consultation today for further work-up and management of highly probable metastatic prostate cancer to the liver and possibly bones.  She started developing abdominal pain about a month ago and also lost about 15 to 20 pounds in the last 3 months without trying.  Her PMD Dr. Livia Snellen has ordered a CT scanning renal study on 05/21/2021 which showed a large 7.1 cm mass centered in the pancreatic body concerning for malignancy.  Mass likely involves greater curvature of the stomach and adjacent portal and splenic vessels.  Suspected metastatic lesions in the left lobe of the liver.  Subcentimeter mesenteric lymph nodes.  She was supposed to come and see me in the office today as an outpatient.  However she has developed epigastric pain and breathing difficulty and came to the ER.  A CT scan of the PE protocol showed segmental and subsegmental right lower lobe pulmonary emboli with trace right pleural effusion.  There was also T10 compression fracture noted.  Large mass involving the pancreatic body and tail, portal vein, portal confluence and superior mesenteric vein and splenic vein thrombosis.  At least 2 suspicious lesions in the left lobe of the liver likely metastatic disease.  She is a retired Warden/ranger.  She lives with her sister.  She does not have children.  She is an ex-smoker, who quit more than 40 years ago.  PAST MEDICAL HISTORY:   Past Medical History:  Diagnosis Date   Allergy    Arthritis    "back, right shoulder, hips" (10/14/2012)   Cataract    Complication of anesthesia    "1950's had appendix out; kicked the RN; no problems since" (10/14/2012)    Heart murmur    normal Echo 2014 per pt   History of migraine    1960s   Hyperlipidemia    Hypertension    Osteopenia    Pneumonia    "couple of times; last time in the mid 1990's" (10/14/2012)   TIA (transient ischemic attack) 10/14/2012    ALLERGIES: Allergies  Allergen Reactions   Celebrex [Celecoxib]     rash   Demerol [Meperidine] Hypertension   Lisinopril     Cough   Statins     Muscle aches   Zyrtec [Cetirizine] Itching      MEDICATIONS: I have reviewed the patient's current medications.     PAST SURGICAL HISTORY Past Surgical History:  Procedure Laterality Date   APPENDECTOMY  1950's   BACK SURGERY     COLONOSCOPY     DILATION AND CURETTAGE OF UTERUS  ~ [place]   "had a disc removed" (10/14/2012)   TONSILLECTOMY  1940's   TOTAL HIP ARTHROPLASTY Right 02/26/2016   Procedure: TOTAL HIP ARTHROPLASTY ANTERIOR APPROACH;  Surgeon: Renette Butters, MD;  Location: [place];  Service: Orthopedics;  Laterality: Right;   TUBAL LIGATION  1960's    FAMILY HISTORY: Family History  Problem Relation Age of Onset   Heart disease Mother    Diabetes Mother    Arthritis Mother    Vascular Disease Mother    Diabetes Sister    Hypertension Sister    Arthritis  Sister    Early death Brother    Vascular Disease Brother    Heart attack Brother    Arthritis Father    Heart attack Brother    Diabetes Sister    Hypertension Sister    Arthritis Sister     SOCIAL HISTORY:  reports that she quit smoking about 35 years ago. Her smoking use included cigarettes. She has a 1.50 pack-year smoking history. She has never used smokeless tobacco. She reports that she does not currently use alcohol after a past usage of about 1.0 standard drink per week. She reports that she does not use drugs.  PERFORMANCE STATUS: The patient's performance status is 3 - Symptomatic, >50% confined to bed  PHYSICAL EXAM: Most Recent Vital Signs: Blood pressure (!) 177/58, pulse  89, temperature (!) 97.2 F (36.2 C), temperature source Oral, resp. rate 16, height '5\' 2"'  (1.575 m), weight 143 lb 1.3 oz (64.9 kg), SpO2 100 %. BP (!) 177/58 (BP Location: Left Arm)    Pulse 89    Temp (!) 97.2 F (36.2 C) (Oral)    Resp 16    Ht '5\' 2"'  (1.575 m)    Wt 143 lb 1.3 oz (64.9 kg)    SpO2 100%    BMI 26.17 kg/m  General appearance: alert, cooperative, and appears stated age Lungs:  Bilateral air entry. Abdomen:  Soft, nontender with no palpable mass. Extremities:  No edema or cyanosis.  LABORATORY DATA:  Results for orders placed or performed during the hospital encounter of 06/04/21 (from the past 48 hour(s))  Blood gas, venous (at [facility] and [facility], not at [facility])     Status: Abnormal   Collection Time: 06/04/21  5:29 PM  Result Value Ref Range   FIO2 28.00    pH, Ven 7.389 7.250 - 7.430   pCO2, Ven 40.1 (L) 44.0 - 60.0 mmHg   pO2, Ven 37.8 32.0 - 45.0 mmHg   Bicarbonate 23.3 20.0 - 28.0 mmol/L   Acid-base deficit 0.6 0.0 - 2.0 mmol/L   O2 Saturation 58.8 %   Patient temperature 36.8    Collection site LEFT ANTECUBITAL    Drawn by [id number]    Sample type VENOUS     Comment: Performed at [facility], [address], [place], [place] [address]  Troponin I (High Sensitivity)     Status: Abnormal   Collection Time: 06/04/21  7:43 PM  Result Value Ref Range   Troponin I (High Sensitivity) 60 (H) <18 ng/L    Comment: (NOTE) Elevated high sensitivity troponin I (hsTnI) values and significant  changes across serial measurements may suggest ACS but many other  chronic and acute conditions are known to elevate hsTnI results.  Refer to the "Links" section for chest pain algorithms and additional  guidance. Performed at [facility], [address], [place], [place] [address]   Troponin I (High Sensitivity)     Status: Abnormal   Collection Time: 06/04/21 11:43 PM  Result Value Ref Range   Troponin I (High Sensitivity) 52 (H) <18 ng/L    Comment: (NOTE) Elevated high sensitivity  troponin I (hsTnI) values and significant  changes across serial measurements may suggest ACS but many other  chronic and acute conditions are known to elevate hsTnI results.  Refer to the "Links" section for chest pain algorithms and additional  guidance. Performed at [facility], [address] [place], [place] [address]   Heparin level (unfractionated)     Status: Abnormal   Collection Time: 06/05/21  3:30 AM  Result Value Ref Range   Heparin Unfractionated <0.10 (L) 0.30 - 0.70 IU/mL    Comment: (NOTE) The clinical reportable range upper limit is being lowered to >1.10 to align with the FDA approved guidance for the current laboratory assay.  If heparin results are below expected values, and patient dosage has  been confirmed, suggest follow up testing of antithrombin III levels. Performed at [facility], [address] [place], [place] [address]   CBC     Status: Abnormal   Collection Time: 06/05/21  3:30 AM  Result Value Ref Range   WBC 6.4 4.0 - 10.5 K/uL   RBC 3.20 (L) 3.87 - 5.11 MIL/uL   Hemoglobin 8.9 (L) 12.0 - 15.0 g/dL   HCT 28.3 (L) 36.0 - 46.0 %   MCV 88.4 80.0 - 100.0 fL   MCH 27.8 26.0 - 34.0 pg   MCHC 31.4 30.0 - 36.0 g/dL   RDW 15.3 11.5 - 15.5 %   Platelets 161 150 - 400 K/uL   nRBC 0.0 0.0 - 0.2 %    Comment: Performed at [facility], [address], [place],  [address]  Procalcitonin - Baseline     Status: None   Collection Time: 06/05/21  3:30 AM  Result Value Ref Range   Procalcitonin <0.10 ng/mL    Comment:        Interpretation: PCT (Procalcitonin) <= 0.5 ng/mL: Systemic infection (sepsis) is not likely. Local bacterial infection is possible. (NOTE)       Sepsis PCT Algorithm           Lower Respiratory Tract                                      Infection PCT Algorithm    ----------------------------     ----------------------------         PCT < 0.25 ng/mL                PCT < 0.10 ng/mL          Strongly encourage              Strongly discourage   discontinuation of antibiotics    initiation of antibiotics    ----------------------------     -----------------------------       PCT 0.25 - 0.50 ng/mL            PCT 0.10 - 0.25 ng/mL               OR       >80% decrease in PCT            Discourage initiation of                                            antibiotics      Encourage discontinuation           of antibiotics    ----------------------------     -----------------------------         PCT >= 0.50 ng/mL              PCT 0.26 - 0.50 ng/mL               AND        <80% decrease in PCT  Encourage initiation of                                             antibiotics       Encourage continuation           of antibiotics    ----------------------------     -----------------------------        PCT >= 0.50 ng/mL                  PCT > 0.50 ng/mL               AND         increase in PCT                  Strongly encourage                                      initiation of antibiotics    Strongly encourage escalation           of antibiotics                                     -----------------------------                                           PCT <= 0.25 ng/mL                                                 OR                                        > 80% decrease in PCT                                      Discontinue / Do not initiate                                             antibiotics  Performed at [place] [place], [place] [address]   Comprehensive metabolic panel     Status: Abnormal   Collection Time: 06/05/21  3:30 AM  Result Value Ref Range   Sodium 136 135 - 145 mmol/L   Potassium 3.9 3.5 - 5.1 mmol/L   Chloride 107 98 - 111 mmol/L   CO2 23 22 - 32 mmol/L   Glucose, Bld 115 (H) 70 - 99 mg/dL    Comment: Glucose reference range applies only to samples taken after fasting for at least 8 hours.   BUN 21 8 - 23 mg/dL   Creatinine, Ser 0.81 0.44 - 1.00  mg/dL   Calcium 12.1 (H) 8.9 - 10.3 mg/dL  Total Protein 5.6 (L) 6.5 - 8.1 g/dL   Albumin 2.8 (L) 3.5 - 5.0 g/dL   AST 32 15 - 41 U/L   ALT 39 0 - 44 U/L   Alkaline Phosphatase 53 38 - 126 U/L   Total Bilirubin 0.6 0.3 - 1.2 mg/dL   GFR, Estimated >60 >60 mL/min    Comment: (NOTE) Calculated using the CKD-EPI Creatinine Equation (2021)    Anion gap 6 5 - 15    Comment: Performed at [facility], [address] [place], [place] [address]  APTT     Status: None   Collection Time: 06/05/21  3:30 AM  Result Value Ref Range   aPTT 33 24 - 36 seconds    Comment: Performed at [facility], [address] [place], [place] [address]  Magnesium     Status: None   Collection Time: 06/05/21  3:30 AM  Result Value Ref Range   Magnesium 1.7 1.7 - 2.4 mg/dL    Comment: Performed at [facility], [address], [place], [place] [address]  Phosphorus     Status: Abnormal   Collection Time: 06/05/21  3:30 AM  Result Value Ref Range   Phosphorus 2.1 (L) 2.5 - 4.6 mg/dL    Comment: Performed at [facility], [address] [place], [place] [address]  Troponin I (High Sensitivity)     Status: Abnormal   Collection Time: 06/05/21  3:30 AM  Result Value Ref Range   Troponin I (High Sensitivity) 42 (H) <18 ng/L    Comment: (NOTE) Elevated high sensitivity troponin I (hsTnI) values and significant  changes across serial measurements may suggest ACS but many other  chronic and acute conditions are known to elevate hsTnI results.  Refer to the "Links" section for chest pain algorithms and additional  guidance. Performed at [facility], [address] [place], [place] [address]   Heparin level (unfractionated)     Status: None   Collection Time: 06/05/21  2:09 PM  Result Value Ref Range   Heparin Unfractionated 0.46 0.30 - 0.70 IU/mL    Comment: (NOTE) The clinical reportable range upper limit is being lowered to >1.10 to align with the FDA approved guidance for the current  laboratory assay.  If heparin results are below expected values, and patient dosage has  been confirmed, suggest follow up testing of antithrombin III levels. Performed at [facility], [address] [place], [place] [address]   Heparin level (unfractionated)     Status: None   Collection Time: 06/05/21  9:34 PM  Result Value Ref Range   Heparin Unfractionated 0.45 0.30 - 0.70 IU/mL    Comment: (NOTE) The clinical reportable range upper limit is being lowered to >1.10 to align with the FDA approved guidance for the current laboratory assay.  If heparin results are below expected values, and patient dosage has  been confirmed, suggest follow up testing of antithrombin III levels. Performed at [facility], [address] [place], [place] [address]   Heparin level (unfractionated)     Status: None   Collection Time: 06/06/21  5:41 AM  Result Value Ref Range   Heparin Unfractionated 0.54 0.30 - 0.70 IU/mL    Comment: (NOTE) The clinical reportable range upper limit is being lowered to >1.10 to align with the FDA approved guidance for the current laboratory assay.  If heparin results are below expected values, and patient dosage has  been confirmed, suggest follow up testing of antithrombin III levels. Performed at [facility], [address], [place], [place] [address]   CBC     Status: Abnormal   Collection Time: 06/06/21  5:41 AM  Result Value Ref Range   WBC 10.9 (H) 4.0 - 10.5 K/uL   RBC 3.29 (L) 3.87 - 5.11 MIL/uL   Hemoglobin 9.2 (L) 12.0 - 15.0 g/dL   HCT 29.2 (L) 36.0 - 46.0 %   MCV 88.8 80.0 - 100.0 fL   MCH 28.0 26.0 - 34.0 pg   MCHC 31.5 30.0 - 36.0 g/dL   RDW 15.5 11.5 - 15.5 %   Platelets 188 150 - 400 K/uL   nRBC 0.0 0.0 - 0.2 %    Comment: Performed at [facility], [address], [place], [place] [address]  Basic metabolic panel     Status: Abnormal   Collection Time: 06/06/21  5:41 AM  Result Value Ref Range   Sodium 135 135 - 145 mmol/L   Potassium  3.8 3.5 - 5.1 mmol/L   Chloride 105 98 - 111 mmol/L   CO2 21 (L) 22 - 32 mmol/L   Glucose, Bld 97 70 - 99 mg/dL    Comment: Glucose reference range applies only to samples taken after fasting for at least 8 hours.   BUN 14 8 - 23 mg/dL   Creatinine, Ser 0.76 0.44 - 1.00 mg/dL   Calcium 12.1 (H) 8.9 - 10.3 mg/dL   GFR, Estimated >60 >60 mL/min    Comment: (NOTE) Calculated using the CKD-EPI Creatinine Equation (2021)    Anion gap 9 5 - 15    Comment: Performed at [facility], [address] [place], [place] [address]      RADIOGRAPHY: CT Angio Chest PE W and/or Wo Contrast  Result Date: 06/04/2021 CLINICAL DATA:  Epigastric pain.  Pancreatic malignancy. EXAM: CT ANGIOGRAPHY CHEST CT ABDOMEN AND PELVIS WITH CONTRAST TECHNIQUE: Multidetector CT imaging of the chest was performed using the standard protocol during bolus administration of intravenous contrast. Multiplanar CT image reconstructions and MIPs were obtained to evaluate the vascular anatomy. Multidetector CT imaging of the abdomen and pelvis was performed using the standard protocol during bolus administration of intravenous contrast. CONTRAST:  50m OMNIPAQUE IOHEXOL 350 MG/ML SOLN COMPARISON:  CT renal stone 05/21/2021. FINDINGS: CTA CHEST FINDINGS Cardiovascular: The heart and aorta are normal in size. There are atherosclerotic calcifications of the aorta. There is no pericardial effusion. There is adequate opacification of the pulmonary arteries to the segmental level. There are segmental and subsegmental right lower lobe pulmonary emboli. There are subsegmental right middle lobe pulmonary emboli. Mediastinum/Nodes: There is a moderate hiatal hernia. The esophagus is nondilated. There are no enlarged mediastinal or hilar lymph nodes. Lungs/Pleura: There is a trace right pleural effusion. There is bilateral lower lung atelectasis. There is a small amount of patchy airspace and ground-glass opacity in the right lower lobe. Trachea and  central airways are patent. No pneumothorax. No suspicious pulmonary nodules. Musculoskeletal: No chest wall abnormality. No acute or significant osseous findings. There is moderate compression fracture of T10 which is new from the prior examination. No axillary lymphadenopathy. Review of the MIP images confirms the above findings. CT ABDOMEN and PELVIS FINDINGS Hepatobiliary: There is a 15 mm hypodense lesion in the left lobe of the liver image 2/19. There is a similar appearing hypodense lesion measuring 2 cm in the left lobe of the liver image 2/32. Small left hepatic cyst in the left lobe measures 9 mm. No gallstones are identified. Gallbladder sludge is likely  present. No definite biliary ductal dilatation. Pancreas: Lobulated low-density mass in the region of the body and tail the pancreas measures 5.8 x 11.4 x 5.5 cm. Low-attenuation lesion in the head of the pancreas versus dilated duct seen on image 2/26 measuring 15 mm. There is loss of fat plane between the lesser curvature of the stomach. This mass encompasses the celiac axis and its proximal branches, possibly causing occlusion. Superior mesenteric vein not definitely involved. 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 Splenic vein not visualized and may be occluded as well. Spleen: There is small wedge-shaped area of hypodensity in the spleen suspicious for infarct. No perisplenic fluid collection identified. The spleen is nonenlarged. Adrenals/Urinary Tract: The bilateral adrenal glands are within normal limits. There are punctate nonobstructing right renal calculi. There is no hydronephrosis in either kidney. There is a 5.2 by 3.8 cm cyst in the right kidney. There is an additional cortical hypodensity in the right kidney which is too small to characterize image 2/23. This likely also represents cyst. Bladder is grossly within normal limits, but evaluation is limited secondary to  streak artifact in the pelvis. Stomach/Bowel: No evidence for bowel obstruction, free air or pneumatosis. There is a large amount of stool throughout the colon. The appendix is not seen. Small bowel loops are within normal limits. Pancreatic mass abuts the body of the stomach with loss of fat plane. The stomach is otherwise normal. There is a moderate-sized hiatal hernia. Vascular/Lymphatic: Aorta and IVC are normal in size. There are atherosclerotic calcifications of the aorta. No discrete enlarged lymph nodes are identified. Reproductive: Uterus and bilateral adnexa are unremarkable. Other: There is a small amount of free fluid in the pelvis. There is a small fat containing umbilical hernia. Musculoskeletal: Degenerative changes affect the spine. No acute fractures are seen. No focal osseous lesions are seen. Right hip arthroplasty is present. Review of the MIP images confirms the above findings. IMPRESSION: 1. Segmental and subsegmental right lower lobe pulmonary emboli. Subsegmental right middle lobe pulmonary emboli. (RV/LV Ratio = 1.0) 2. Trace right pleural effusion. 3. Minimal patchy airspace disease and ground-glass opacities in the right lower lobe suspicious for infection. 4. T10 compression fracture is new from May 21, 2021. 5. Large mass involving the pancreatic body and tail most compatible with primary pancreatic neoplasm. Questionable ductal dilatation in the head of the pancreas versus second smaller lesion. 6. 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 7. 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 8. Likely small splenic infarct. 9. There are 2 suspicious lesions in the left lobe of the liver, likely metastatic disease. 10. Nonobstructing right renal calculi. 11. Small amount of free fluid in the pelvis. 12. Large stool burden. 13.  Aortic Atherosclerosis (ICD10-I70.0). These results were  called by telephone at the time of interpretation on 06/04/2021 at 7:28 pm to provider Wynona Dove , who verbally acknowledged these results. Electronically Signed   By: Ronney Asters M.D.   On: 06/04/2021 19:32   CT ABDOMEN PELVIS W CONTRAST  Result Date: 06/04/2021 CLINICAL DATA:  Epigastric pain.  Pancreatic malignancy. EXAM: CT ANGIOGRAPHY CHEST CT ABDOMEN AND PELVIS WITH CONTRAST TECHNIQUE: Multidetector CT imaging of the chest was performed using the standard protocol during bolus administration of intravenous contrast. Multiplanar CT image reconstructions and MIPs were obtained to evaluate the vascular anatomy. Multidetector CT imaging of the  abdomen and pelvis was performed using the standard protocol during bolus administration of intravenous contrast. CONTRAST:  63m OMNIPAQUE IOHEXOL 350 MG/ML SOLN COMPARISON:  CT renal stone 05/21/2021. FINDINGS: CTA CHEST FINDINGS Cardiovascular: The heart and aorta are normal in size. There are atherosclerotic calcifications of the aorta. There is no pericardial effusion. There is adequate opacification of the pulmonary arteries to the segmental level. There are segmental and subsegmental right lower lobe pulmonary emboli. There are subsegmental right middle lobe pulmonary emboli. Mediastinum/Nodes: There is a moderate hiatal hernia. The esophagus is nondilated. There are no enlarged mediastinal or hilar lymph nodes. Lungs/Pleura: There is a trace right pleural effusion. There is bilateral lower lung atelectasis. There is a small amount of patchy airspace and ground-glass opacity in the right lower lobe. Trachea and central airways are patent. No pneumothorax. No suspicious pulmonary nodules. Musculoskeletal: No chest wall abnormality. No acute or significant osseous findings. There is moderate compression fracture of T10 which is new from the prior examination. No axillary lymphadenopathy. Review of the MIP images confirms the above findings. CT ABDOMEN and PELVIS  FINDINGS Hepatobiliary: There is a 15 mm hypodense lesion in the left lobe of the liver image 2/19. There is a similar appearing hypodense lesion measuring 2 cm in the left lobe of the liver image 2/32. Small left hepatic cyst in the left lobe measures 9 mm. No gallstones are identified. Gallbladder sludge is likely present. No definite biliary ductal dilatation. Pancreas: Lobulated low-density mass in the region of the body and tail the pancreas measures 5.8 x 11.4 x 5.5 cm. Low-attenuation lesion in the head of the pancreas versus dilated duct seen on image 2/26 measuring 15 mm. There is loss of fat plane between the lesser curvature of the stomach. This mass encompasses the celiac axis and its proximal branches, possibly causing occlusion. Superior mesenteric vein not definitely involved. 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 Splenic vein not visualized and may be occluded as well. Spleen: There is small wedge-shaped area of hypodensity in the spleen suspicious for infarct. No perisplenic fluid collection identified. The spleen is nonenlarged. Adrenals/Urinary Tract: The bilateral adrenal glands are within normal limits. There are punctate nonobstructing right renal calculi. There is no hydronephrosis in either kidney. There is a 5.2 by 3.8 cm cyst in the right kidney. There is an additional cortical hypodensity in the right kidney which is too small to characterize image 2/23. This likely also represents cyst. Bladder is grossly within normal limits, but evaluation is limited secondary to streak artifact in the pelvis. Stomach/Bowel: No evidence for bowel obstruction, free air or pneumatosis. There is a large amount of stool throughout the colon. The appendix is not seen. Small bowel loops are within normal limits. Pancreatic mass abuts the body of the stomach with loss of fat plane. The stomach is otherwise normal. There is a moderate-sized  hiatal hernia. Vascular/Lymphatic: Aorta and IVC are normal in size. There are atherosclerotic calcifications of the aorta. No discrete enlarged lymph nodes are identified. Reproductive: Uterus and bilateral adnexa are unremarkable. Other: There is a small amount of free fluid in the pelvis. There is a small fat containing umbilical hernia. Musculoskeletal: Degenerative changes affect the spine. No acute fractures are seen. No focal osseous lesions are seen. Right hip arthroplasty is present. Review of the MIP images confirms the above findings. IMPRESSION: 1. Segmental and subsegmental right lower lobe pulmonary emboli. Subsegmental right middle lobe pulmonary  emboli. (RV/LV Ratio = 1.0) 2. Trace right pleural effusion. 3. Minimal patchy airspace disease and ground-glass opacities in the right lower lobe suspicious for infection. 4. T10 compression fracture is new from May 21, 2021. 5. Large mass involving the pancreatic body and tail most compatible with primary pancreatic neoplasm. Questionable ductal dilatation in the head of the pancreas versus second smaller lesion. 6. 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 7. 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 8. Likely small splenic infarct. 9. There are 2 suspicious lesions in the left lobe of the liver, likely metastatic disease. 10. Nonobstructing right renal calculi. 11. Small amount of free fluid in the pelvis. 12. Large stool burden. 13.  Aortic Atherosclerosis (ICD10-I70.0). These results were called by telephone at the time of interpretation on 06/04/2021 at 7:28 pm to provider Wynona Dove , who verbally acknowledged these results. Electronically Signed   By: Ronney Asters M.D.   On: 06/04/2021 19:32   CT T-SPINE NO CHARGE  Result Date: 06/04/2021 CLINICAL DATA:  Initial evaluation for acute trauma, fall. EXAM: CT Thoracic and Lumbar spine  Contrast  TECHNIQUE: Multiplanar CT images of the thoracic and lumbar spine were reconstructed from contemporary CT of the Chest, Abdomen, and Pelvis CONTRAST:  None or No additional COMPARISON:  Comparison made with concomitant CT of the chest, abdomen, and pelvis. FINDINGS: CT THORACIC SPINE FINDINGS Alignment: Exaggeration of the normal thoracic kyphosis. No listhesis. Vertebrae: There is an acute compression fracture involving the T10 vertebral body. Associated height loss measures up to 50% with 3 mm bony retropulsion. No significant stenosis. Otherwise, vertebral body height maintained with no other acute or chronic fracture. Visualized ribs intact. No worrisome osseous lesions. Paraspinal and other soft tissues: Paraspinous soft tissues demonstrate no acute finding. Small layering right pleural effusion with associated atelectasis. Previously identified pancreatic neoplasm partially visualize, better evaluated on concomitant CT of the abdomen and pelvis. Hiatal hernia noted. Scattered aortic atherosclerosis. Acute pulmonary embolism within right lower lobe segmental branches, also better evaluated on concomitant chest CT. Disc levels: No significant disc pathology or stenosis seen within the thoracic spine. CT LUMBAR SPINE FINDINGS Segmentation: Standard. Lowest well-formed disc space labeled the L5-S1 level. Alignment: Moderate levoscoliosis with apex at L2-3. Alignment otherwise normal preservation of the normal lumbar lordosis. Vertebrae: Vertebral body height maintained without acute or chronic fracture. Visualized sacrum and pelvis intact. No worrisome osseous lesions. Paraspinal and other soft tissues: Paraspinous soft tissues demonstrate no acute finding. 5.4 cm parapelvic cyst noted within the right kidney. Nonobstructive right renal nephrolithiasis noted. Aorto bi-iliac atherosclerotic disease. Disc levels: Mild multilevel degenerative spondylosis, most pronounced at L1-2, L4-5, and L5-S1. Lower lumbar facet  hypertrophy. No significant spinal stenosis. Moderate bilateral L5 foraminal narrowing. IMPRESSION: CT THORACIC SPINE IMPRESSION: 1. Compression fracture involving the T10 vertebral body with up to 50% height loss and 3 mm bony retropulsion, new as compared to 05/21/2021, consistent with an acute/subacute finding. No significant stenosis. 2. No other acute traumatic injury within the thoracic spine. 3. Acute right-sided pulmonary emboli, better evaluated on concomitant CT of the chest. 4. Small layering right pleural effusion with associated atelectasis. CT LUMBAR SPINE IMPRESSION: 1. No acute traumatic injury within the lumbar spine. 2. Moderate levoscoliosis with apex at L2-3. 3. Mild multilevel degenerative spondylosis, most pronounced at L1-2, L4-5, and L5-S1. Moderate bilateral L5 foraminal narrowing. 4. Nonobstructive right renal nephrolithiasis. 5. Aortic Atherosclerosis (ICD10-I70.0). Results regarding the  acute pulmonary emboli were communicated to the ordering clinician by Dr. Ronney Asters, documented on corresponding CTA of the chest. Electronically Signed   By: Jeannine Boga M.D.   On: 06/04/2021 19:45   CT L-SPINE NO CHARGE  Result Date: 06/04/2021 CLINICAL DATA:  Initial evaluation for acute trauma, fall. EXAM: CT Thoracic and Lumbar spine  Contrast TECHNIQUE: Multiplanar CT images of the thoracic and lumbar spine were reconstructed from contemporary CT of the Chest, Abdomen, and Pelvis CONTRAST:  None or No additional COMPARISON:  Comparison made with concomitant CT of the chest, abdomen, and pelvis. FINDINGS: CT THORACIC SPINE FINDINGS Alignment: Exaggeration of the normal thoracic kyphosis. No listhesis. Vertebrae: There is an acute compression fracture involving the T10 vertebral body. Associated height loss measures up to 50% with 3 mm bony retropulsion. No significant stenosis. Otherwise, vertebral body height maintained with no other acute or chronic fracture. Visualized ribs intact.  No worrisome osseous lesions. Paraspinal and other soft tissues: Paraspinous soft tissues demonstrate no acute finding. Small layering right pleural effusion with associated atelectasis. Previously identified pancreatic neoplasm partially visualize, better evaluated on concomitant CT of the abdomen and pelvis. Hiatal hernia noted. Scattered aortic atherosclerosis. Acute pulmonary embolism within right lower lobe segmental branches, also better evaluated on concomitant chest CT. Disc levels: No significant disc pathology or stenosis seen within the thoracic spine. CT LUMBAR SPINE FINDINGS Segmentation: Standard. Lowest well-formed disc space labeled the L5-S1 level. Alignment: Moderate levoscoliosis with apex at L2-3. Alignment otherwise normal preservation of the normal lumbar lordosis. Vertebrae: Vertebral body height maintained without acute or chronic fracture. Visualized sacrum and pelvis intact. No worrisome osseous lesions. Paraspinal and other soft tissues: Paraspinous soft tissues demonstrate no acute finding. 5.4 cm parapelvic cyst noted within the right kidney. Nonobstructive right renal nephrolithiasis noted. Aorto bi-iliac atherosclerotic disease. Disc levels: Mild multilevel degenerative spondylosis, most pronounced at L1-2, L4-5, and L5-S1. Lower lumbar facet hypertrophy. No significant spinal stenosis. Moderate bilateral L5 foraminal narrowing. IMPRESSION: CT THORACIC SPINE IMPRESSION: 1. Compression fracture involving the T10 vertebral body with up to 50% height loss and 3 mm bony retropulsion, new as compared to 05/21/2021, consistent with an acute/subacute finding. No significant stenosis. 2. No other acute traumatic injury within the thoracic spine. 3. Acute right-sided pulmonary emboli, better evaluated on concomitant CT of the chest. 4. Small layering right pleural effusion with associated atelectasis. CT LUMBAR SPINE IMPRESSION: 1. No acute traumatic injury within the lumbar spine. 2. Moderate  levoscoliosis with apex at L2-3. 3. Mild multilevel degenerative spondylosis, most pronounced at L1-2, L4-5, and L5-S1. Moderate bilateral L5 foraminal narrowing. 4. Nonobstructive right renal nephrolithiasis. 5. Aortic Atherosclerosis (ICD10-I70.0). Results regarding the acute pulmonary emboli were communicated to the ordering clinician by Dr. Ronney Asters, documented on corresponding CTA of the chest. Electronically Signed   By: Jeannine Boga M.D.   On: 06/04/2021 19:45   DG Chest Port 1 View  Result Date: 06/04/2021 CLINICAL DATA:  Shortness of breath.  Back pain. EXAM: PORTABLE CHEST 1 VIEW COMPARISON:  Two-view chest x-ray 01/22/2016 FINDINGS: Heart size upper limits of normal, exaggerated by low lung volumes. Atherosclerotic changes are present in the arch. Mild atelectasis or scarring is present both bases. No edema or effusion is present airspace consolidation is present. Bony thorax is within normal limits. IMPRESSION: 1. Low lung volumes. 2. Mild bibasilar atelectasis or scarring. 3. Atherosclerosis. Electronically Signed   By: San Morelle M.D.   On: 06/04/2021 17:44   ECHOCARDIOGRAM COMPLETE  Result Date: 06/05/2021  ECHOCARDIOGRAM REPORT   Patient Name:   LAURALYN SHADOWENS Date of Exam: 06/05/2021 Medical Rec #:  [id number]         Height:       62.0 in Accession #:    [id number]        Weight:       137.6 lb Date of Birth:  [birth year]         BSA:          1.631 m Patient Age:    3 years          BP:           136/52 mmHg Patient Gender: F                 HR:           88 bpm. Exam Location:  [facility] Procedure: 2D Echo, Cardiac Doppler and Color Doppler Indications:    Pulmonary Embolus  History:        Patient has prior history of Echocardiogram examinations, most                 recent 10/15/2012. TIA, Arrythmias:Bradycardia; Risk                 Factors:Hypertension, Dyslipidemia and Former Smoker.  Sonographer:    Wenda Low Referring Phys: [id number] OLADAPO ADEFESO  IMPRESSIONS  1. Left ventricular ejection fraction, by estimation, is 60 to 65%. The left ventricle has normal function. The left ventricle has no regional wall motion abnormalities. Left ventricular diastolic parameters are consistent with Grade I diastolic dysfunction (impaired relaxation).  2. Right ventricular systolic function is normal. The right ventricular size is normal. Tricuspid regurgitation signal is inadequate for assessing PA pressure.  3. Left atrial size was mildly dilated.  4. The mitral valve is normal in structure. No evidence of mitral valve regurgitation. No evidence of mitral stenosis.  5. The aortic valve is tricuspid. Aortic valve regurgitation is trivial. No aortic stenosis is present. FINDINGS  Left Ventricle: Left ventricular ejection fraction, by estimation, is 60 to 65%. The left ventricle has normal function. The left ventricle has no regional wall motion abnormalities. The left ventricular internal cavity size was normal in size. There is  no left ventricular hypertrophy. Left ventricular diastolic parameters are consistent with Grade I diastolic dysfunction (impaired relaxation). Normal left ventricular filling pressure. Right Ventricle: The right ventricular size is normal. No increase in right ventricular wall thickness. Right ventricular systolic function is normal. Tricuspid regurgitation signal is inadequate for assessing PA pressure. Left Atrium: Left atrial size was mildly dilated. Right Atrium: Right atrial size was normal in size. Pericardium: There is no evidence of pericardial effusion. Mitral Valve: The mitral valve is normal in structure. No evidence of mitral valve regurgitation. No evidence of mitral valve stenosis. MV peak gradient, 4.5 mmHg. The mean mitral valve gradient is 2.0 mmHg. Tricuspid Valve: The tricuspid valve is normal in structure. Tricuspid valve regurgitation is trivial. No evidence of tricuspid stenosis. Aortic Valve: The aortic valve is tricuspid.  Aortic valve regurgitation is trivial. No aortic stenosis is present. Aortic valve mean gradient measures 4.0 mmHg. Aortic valve peak gradient measures 10.6 mmHg. Aortic valve area, by VTI measures 2.10 cm. Pulmonic Valve: The pulmonic valve was not well visualized. Pulmonic valve regurgitation is not visualized. No evidence of pulmonic stenosis. Aorta: The aortic root is normal in size and structure. Venous: The inferior vena cava was not well visualized. IAS/Shunts: The interatrial septum  was not well visualized.  LEFT VENTRICLE PLAX 2D LVIDd:         4.40 cm     Diastology LVIDs:         2.90 cm     LV e' medial:    7.40 cm/s LV PW:         1.00 cm     LV E/e' medial:  9.7 LV IVS:        1.00 cm     LV e' lateral:   5.66 cm/s LVOT diam:     1.90 cm     LV E/e' lateral: 12.7 LV SV:         64 LV SV Index:   39 LVOT Area:     2.84 cm  LV Volumes (MOD) LV vol d, MOD A2C: 40.0 ml LV vol d, MOD A4C: 33.3 ml LV vol s, MOD A2C: 17.2 ml LV vol s, MOD A4C: 14.1 ml LV SV MOD A2C:     22.8 ml LV SV MOD A4C:     33.3 ml LV SV MOD BP:      21.6 ml RIGHT VENTRICLE RV Basal diam:  3.25 cm RV Mid diam:    2.90 cm RV S prime:     14.10 cm/s TAPSE (M-mode): 3.7 cm LEFT ATRIUM             Index        RIGHT ATRIUM           Index LA diam:        4.10 cm 2.51 cm/m   RA Area:     11.90 cm LA Vol (A2C):   60.8 ml 37.29 ml/m  RA Volume:   27.50 ml  16.86 ml/m LA Vol (A4C):   57.4 ml 35.20 ml/m LA Biplane Vol: 59.4 ml 36.43 ml/m  AORTIC VALVE                    PULMONIC VALVE AV Area (Vmax):    2.10 cm     PV Vmax:       1.14 m/s AV Area (Vmean):   2.06 cm     PV Peak grad:  5.2 mmHg AV Area (VTI):     2.10 cm AV Vmax:           163.00 cm/s AV Vmean:          92.700 cm/s AV VTI:            0.306 m AV Peak Grad:      10.6 mmHg AV Mean Grad:      4.0 mmHg LVOT Vmax:         121.00 cm/s LVOT Vmean:        67.400 cm/s LVOT VTI:          0.227 m LVOT/AV VTI ratio: 0.74  AORTA Ao Root diam: 3.00 cm MITRAL VALVE MV Area (PHT): 3.33  cm    SHUNTS MV Area VTI:   2.43 cm    Systemic VTI:  0.23 m MV Peak grad:  4.5 mmHg    Systemic Diam: 1.90 cm MV Mean grad:  2.0 mmHg MV Vmax:       1.06 m/s MV Vmean:      56.4 cm/s MV Decel Time: 228 msec MV E velocity: 71.60 cm/s MV A velocity: 97.70 cm/s MV E/A ratio:  0.73 Carlyle Dolly MD Electronically signed by Carlyle Dolly MD Signature Date/Time: 06/05/2021/3:20:32 PM    Final  ASSESSMENT:  1.  Metastatic pancreatic cancer to the liver and bones: - CT renal study on 05/21/2021 with large 7.1 cm pancreatic body mass likely involving greater curvature of the stomach and adjacent portion and splenic vessels with suspected metastatic lesions in the left lobe of the liver. - CT CAP angio on 06/04/2021 showed segmental and subsegmental right lower lobe pulmonary emboli.  T10 compression fracture is new from May 21, 2021.  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  2 suspicious lesions in the left lobe of the liver likely metastatic disease. - She has lost 15 to 20 pounds in the last 2 to 3 months.  Also has abdominal pain for the last 1 month.  2.  Social/family history: - She lives at home with her sister.  She is a retired Warden/ranger.  She is ex-smoker, who quit 40 years ago.  PLAN:  1.  Highly probable metastatic pancreatic cancer to the liver and bones: - We have reviewed CT results in detail. - We have discussed high probability of pancreatic adenocarcinoma and less likely possibility of pancreatic neuroendocrine tumors. - She is anticoagulated with heparin drip at this time.  Hence we will put off on biopsy.  We will order CA 19-9 level.  If it is significantly elevated, we can assume that it is metastatic pancreatic cancer. - I do not believe she is a candidate for treatment.  The patient is in and out of sleep.  I have talked to her 2 sisters were at bedside.  We talked about best supportive care in the form of hospice.  They reported  to me that her sister (the patient) also expressed the same interest. - She will need placement at a inpatient hospice home. - We have recommended genetic testing which will be sent today.  This will likely help her siblings to have them tested in case she is positive for any mismatch repair gene mutations.  2.  Malignant hypercalcemia: - Calcium level is 12.1.  Albumin was low at 2.8. - She has a T10 fracture. - Calcium level is not improving despite IV fluids.  Recommend Zometa 4 mg IV x1.  3.  Pulmonary embolism: - She is currently receiving IV heparin drip which will be transitioned to Eliquis.  All questions were answered. The patient knows to call the clinic with any problems, questions or concerns. We can certainly see the patient much sooner if necessary.   Derek Jack

## 2021-06-06 NOTE — Progress Notes (Signed)
ANTICOAGULATION CONSULT NOTE - Initial Consult  Pharmacy Consult for apixaban dosing Indication: pulmonary embolus  Allergies  Allergen Reactions   Celebrex [Celecoxib]     rash   Demerol [Meperidine] Hypertension   Lisinopril     Cough   Statins     Muscle aches   Zyrtec [Cetirizine] Itching    Patient Measurements: Height: 5\' 2"  (157.5 cm) Weight: 64.9 kg (143 lb 1.3 oz) IBW/kg (Calculated) : 50.1   Vital Signs: Temp: 97.2 F (36.2 C) (01/05 1325) Temp Source: Oral (01/05 1325) BP: 177/58 (01/05 1325) Pulse Rate: 89 (01/05 1325)  Labs: Recent Labs    06/04/21 1645 06/04/21 1645 06/05/21 0330 06/05/21 1409 06/05/21 2134 06/06/21 0541  HGB 9.2*  --  8.9*  --   --  9.2*  HCT 28.9*  --  28.3*  --   --  29.2*  PLT 170  --  161  --   --  188  APTT  --   --  33  --   --   --   HEPARINUNFRC  --    < > <0.10* 0.46 0.45 0.54  CREATININE 0.89  --  0.81  --   --  0.76   < > = values in this interval not displayed.    Estimated Creatinine Clearance: 49.6 mL/min (by C-G formula based on SCr of 0.76 mg/dL). Lab Results  Component Value Date   CREATININE 0.76 06/06/2021   CREATININE 0.81 06/05/2021   CREATININE 0.89 06/04/2021   Filed Weights   06/04/21 2252 06/05/21 0538 06/06/21 0500  Weight: 61.3 kg (135 lb 2.3 oz) 62.4 kg (137 lb 9.1 oz) 64.9 kg (143 lb 1.3 oz)     Medical History: Past Medical History:  Diagnosis Date   Allergy    Arthritis    "back, right shoulder, hips" (10/14/2012)   Cataract    Complication of anesthesia    "1950's had appendix out; kicked the RN; no problems since" (10/14/2012)   Heart murmur    normal Echo 2014 per pt   History of migraine    1960s   Hyperlipidemia    Hypertension    Osteopenia    Pneumonia    "couple of times; last time in the mid 1990's" (10/14/2012)   TIA (transient ischemic attack) 10/14/2012    Medications:  Medications Prior to Admission  Medication Sig Dispense Refill Last Dose   acetaminophen  (TYLENOL) 650 MG CR tablet Take 650 mg by mouth at bedtime.   unknown   aspirin 325 MG EC tablet Take 325 mg by mouth daily.   06/04/2021   doxepin (SINEQUAN) 25 MG capsule TAKE 1 CAPSULE AT BEDTIME 90 capsule 3 06/03/2021   linaclotide (LINZESS) 290 MCG CAPS capsule Take 1 capsule (290 mcg total) by mouth daily before breakfast. 30 capsule 2 06/04/2021   losartan (COZAAR) 100 MG tablet TAKE 1 TABLET DAILY 90 tablet 0 06/04/2021   megestrol (MEGACE) 400 MG/10ML suspension Take 10 mLs (400 mg total) by mouth 2 (two) times daily. For appetite stimulation 600 mL 2 06/04/2021   ondansetron (ZOFRAN) 4 MG tablet Take 1 tablet (4 mg total) by mouth every 8 (eight) hours as needed for nausea or vomiting. 60 tablet 0 06/04/2021   OVER THE COUNTER MEDICATION Vitamin d3   06/04/2021   oxycodone (OXY-IR) 5 MG capsule Take 1 capsule (5 mg total) by mouth every 4 (four) hours as needed. 120 capsule 0 06/03/2021   pantoprazole (PROTONIX) 40 MG tablet Take 1  tablet (40 mg total) by mouth daily. For stomach 30 tablet 11 06/04/2021   Potassium Gluconate 550 (90 K) MG TABS Take 550 mg by mouth daily.   06/04/2021   raloxifene (EVISTA) 60 MG tablet TAKE 1 TABLET DAILY 90 tablet 0 06/04/2021   Calcium Citrate (CITRACAL PO) Take 600 mg by mouth 2 (two) times daily. (Patient not taking: Reported on 06/04/2021)   Not Taking   Collagen-Vitamin C-Biotin (COLLAGEN 1500/C PO) Take by mouth. (Patient not taking: Reported on 06/04/2021)   Not Taking   Multiple Vitamin (MULTIVITAMIN) capsule Take 1 capsule by mouth daily. (Patient not taking: Reported on 06/04/2021)   Not Taking   niacin 500 MG tablet Take 500 mg by mouth at bedtime. (Patient not taking: Reported on 06/04/2021)   Not Taking   polyethylene glycol powder (GLYCOLAX/MIRALAX) 17 GM/SCOOP powder Take 17 g by mouth 2 (two) times daily as needed. (Patient not taking: Reported on 06/04/2021) 3350 g 1 Not Taking   Scheduled:   apixaban  10 mg Oral BID   Followed by   Derrill Memo ON 06/13/2021] apixaban  5  mg Oral BID   aspirin  325 mg Oral Daily   Infusions:   methocarbamol (ROBAXIN) IV     PRN: acetaminophen **OR** acetaminophen, HYDROmorphone (DILAUDID) injection, methocarbamol (ROBAXIN) IV, oxyCODONE Anti-infectives (From admission, onward)    None       Assessment: Andrea Moreno a 81 y.o. female requires anticoagulation with apixaban for the indication of  pulmonary embolus. Apixban will be started following pharmacy protocol per pharmacy consult.   Goal of Therapy:  Patient to be properly anticoagulated with apixaban Monitor platelets by anticoagulation protocol: yes   Plan:   Stop IV heparin infusion, within 2 hours:   Apixban 10mg  po BID x 7 days, followed by apixaban 5mg  po BID, duration of therapy to be determined by following physician  CBC MWF Monitor for signs and symptoms of bleeding   Thomasenia Sales, PharmD Clinical Pharmacist 06/06/2021,3:57 PM

## 2021-06-06 NOTE — Discharge Planning (Signed)
Oncology Discharge Planning Admission Note  [place] at [facility] Address: [address], [place] [address] Hours of Operation:  8am - 5pm, Monday - Friday  Clinic Contact Information:  [phone]  Oncology Care Team: Medical Oncologist:  Derek Jack  Patient will be seen in hospital as a new patient of Dr Tomie China today, as she had a initial appointment today. Cancer center will follow [facility] inpatient care to assist with discharge planning as indicated by the oncologist.  We will reach out to you closer to discharge date to arrange your follow up care.  Disclaimer:  This [place] note does not imply a formal consult request has been made by the admitting attending for this admission or there will be an inpatient consult completed by oncology.  Please request oncology consults as per standard process as indicated.

## 2021-06-06 NOTE — Evaluation (Signed)
Occupational Therapy Evaluation Patient Details Name: Andrea Moreno MRN: [id number] DOB: [birth year] Today's Date: 06/06/2021   History of Present Illness Andrea Moreno is a 81 y.o. female with medical history significant for  Pancreatic cancer (not yet on chemotherapy), essential hypertension, GERD who presents to the emergency department via EMS with complaint of 3 months of worsening chronic back pain with minimal to no relief on oral analgesics.  Back pain rapidly worsened within the past week, pain was midthoracic with radiation to the ribs (R > L).  This was associated with a decreased activity within past few weeks due to intensity of the back pain.  EMS was activated due to shortness of breath, on arrival of EMS team, patient was noted to be hypoxic in the 80s (patient does not use oxygen at baseline).  Supplemental oxygen via [place] at 2 LPM was provided with improvement in O2 sats, though she still complains of pleuritic chest pain which worsens with breathing and also complains of few days of constipation, but denies fever, chills, chest congestion, cough, numbness, tingling, urine/bowel incontinence.  Patient has a pending schedule to see hematology/oncology on Thursday (1/4)   Clinical Impression   Pt agreeable to OT/PT co-evaluation. Pt presents with confusion without orientation to situation, year, or month. Pt reports independence at baseline but may not be fully reliable. No family present to determine baseline cognition. Pt required mod A for bed mobility and functional transfer. RW needed for sit to stand and transfer to chair with poor balance and labored movement. Pt presents with general weakness in B UE and slow fine and gross motor coordination. TLSO brace donned for duration of session. Pt will benefit from continued OT in the hospital and recommended venue below to increase strength, balance, and endurance for safe ADL's.        Recommendations for follow up therapy are one  component of a multi-disciplinary discharge planning process, led by the attending physician.  Recommendations may be updated based on patient status, additional functional criteria and insurance authorization.   Follow Up Recommendations  Skilled nursing-short term rehab (<3 hours/day)    Assistance Recommended at Discharge Frequent or constant Supervision/Assistance  Patient can return home with the following A little help with walking and/or transfers;A lot of help with bathing/dressing/bathroom;Direct supervision/assist for medications management;Direct supervision/assist for financial management;Assist for transportation;Help with stairs or ramp for entrance    Functional Status Assessment  Patient has had a recent decline in their functional status and demonstrates the ability to make significant improvements in function in a reasonable and predictable amount of time.  Equipment Recommendations  None recommended by OT    Recommendations for Other Services       Precautions / Restrictions Precautions Precautions: Fall Precaution Comments: TLSO brace Required Braces or Orthoses: Other Brace Other Brace: TLSO brace Restrictions Weight Bearing Restrictions: No      Mobility Bed Mobility Overal bed mobility: Needs Assistance Bed Mobility: Rolling;Sidelying to Sit Rolling: Min assist Sidelying to sit: Mod assist       General bed mobility comments: slow labored movement with assist    Transfers Overall transfer level: Needs assistance Equipment used: Rolling walker (2 wheels) Transfers: Sit to/from Stand;Bed to chair/wheelchair/BSC Sit to Stand: Mod assist;Min assist     Step pivot transfers: Mod assist     General transfer comment: Slow labored movement with tactile and verbal cuing needed to manage RW.      Balance Overall balance assessment: Needs assistance Sitting-balance support: No  upper extremity supported;Feet supported Sitting balance-Leahy Scale:  Fair Sitting balance - Comments: fair to good seated at EOB   Standing balance support: Bilateral upper extremity supported;During functional activity;Reliant on assistive device for balance Standing balance-Leahy Scale: Poor Standing balance comment: using RW                           ADL either performed or assessed with clinical judgement   ADL Overall ADL's : Needs assistance/impaired                     Lower Body Dressing: Minimal assistance;Sitting/lateral leans Lower Body Dressing Details (indicate cue type and reason): Per clinical judgement Toilet Transfer: Moderate assistance;Rolling walker (2 wheels);Stand-pivot Armed forces technical officer Details (indicate cue type and reason): simulated via EOB to chair Toileting- Clothing Manipulation and Hygiene: Minimal assistance;Moderate assistance;Sit to/from stand Toileting - Clothing Manipulation Details (indicate cue type and reason): per clincial judgement     Functional mobility during ADLs: Moderate assistance;Rolling walker (2 wheels) General ADL Comments: Very slow labored movement during functional transfers.     Vision Baseline Vision/History: 1 Wears glasses Ability to See in Adequate Light: 1 Impaired Patient Visual Report: No change from baseline Vision Assessment?: Yes Tracking/Visual Pursuits: Other (comment) (Mild difficulty with tracking; slow place slightly behind stimulus.)     Perception     Praxis      Pertinent Vitals/Pain Pain Assessment: 0-10 Pain Score: 10-Worst pain ever Breathing: occasional labored breathing, short period of hyperventilation Negative Vocalization: occasional moan/groan, low speech, negative/disapproving quality Facial Expression: sad, frightened, frown Body Language: tense, distressed pacing, fidgeting Consolability: no need to console PAINAD Score: 4 Pain Location: low back Pain Descriptors / Indicators:  ("pain") Pain Intervention(s): Limited activity within  patient's tolerance;Monitored during session;Repositioned     Hand Dominance Right   Extremity/Trunk Assessment Upper Extremity Assessment Upper Extremity Assessment: Generalized weakness (Slow fine and gross motor coordination.)   Lower Extremity Assessment Lower Extremity Assessment: Defer to PT evaluation   Cervical / Trunk Assessment Cervical / Trunk Assessment: Kyphotic   Communication Communication Communication: No difficulties   Cognition Arousal/Alertness: Awake/alert Behavior During Therapy: WFL for tasks assessed/performed Overall Cognitive Status: No family/caregiver present to determine baseline cognitive functioning                                 General Comments: Pt not orented to situation, place, or year.                      Home Living Family/patient expects to be discharged to:: Private residence Living Arrangements: Alone Available Help at Discharge: Personal care attendant;Available PRN/intermittently Type of Home: House Home Access: Stairs to enter [employer] of Steps: 5 Entrance Stairs-Rails: Right;Left;Can reach both Home Layout: One level     Bathroom Shower/Tub: Tub/shower unit;Walk-in shower   Bathroom Toilet: Handicapped height     Home Equipment: Conservation officer, nature (2 wheels);Cane - single point;Wheelchair - manual   Additional Comments: Per pt report; pt may not be relable based on poor orientation.      Prior Functioning/Environment Prior Level of Function : Needs assist       Physical Assist : Mobility (physical) Mobility (physical): Transfers;Gait;Stairs   Mobility Comments: Pt reports cane usage most of the time with household and community ambulation. ADLs Comments: Pt reports independence with ADL's and IADL's but also reported that  she has a "helper" that comes in PRN. Information per pt report.        OT Problem List: Decreased strength;Decreased range of motion;Decreased activity  tolerance;Decreased coordination;Decreased cognition      OT Treatment/Interventions: Self-care/ADL training;Therapeutic exercise;Therapeutic activities;Cognitive remediation/compensation;Patient/family education;Balance training    OT Goals(Current goals can be found in the care plan section) Acute Rehab OT Goals Patient Stated Goal: return home OT Goal Formulation: With patient Time For Goal Achievement: 06/20/21 Potential to Achieve Goals: Fair  OT Frequency: Min 2X/week    Co-evaluation PT/OT/SLP Co-Evaluation/Treatment: Yes Reason for Co-Treatment: To address functional/ADL transfers   OT goals addressed during session: ADL's and self-care                       End of Session Equipment Utilized During Treatment: Rolling walker (2 wheels)  Activity Tolerance: Patient tolerated treatment well Patient left: in chair;with call bell/phone within reach;with chair alarm set  OT Visit Diagnosis: Unsteadiness on feet (R26.81);Other abnormalities of gait and mobility (R26.89);Muscle weakness (generalized) (M62.81);Other symptoms and signs involving cognitive function                Time: 6468-0321 OT Time Calculation (min): 28 min Charges:  OT General Charges $OT Visit: 1 Visit OT Evaluation $OT Eval Low Complexity: 1 Low  Regnald Bowens OT, MOT  Larey Seat 06/06/2021, 10:22 AM

## 2021-06-06 NOTE — Plan of Care (Signed)
°  Problem: Acute Rehab OT Goals (only OT should resolve) Goal: Pt. Will Perform Grooming Flowsheets (Taken 06/06/2021 1036) Pt Will Perform Grooming:  with supervision  standing Goal: Pt. Will Perform Lower Body Bathing Flowsheets (Taken 06/06/2021 1036) Pt Will Perform Lower Body Bathing:  with supervision  sitting/lateral leans Goal: Pt. Will Perform Upper Body Dressing Flowsheets (Taken 06/06/2021 1036) Pt Will Perform Upper Body Dressing: with modified independence Goal: Pt. Will Perform Lower Body Dressing Flowsheets (Taken 06/06/2021 1036) Pt Will Perform Lower Body Dressing:  sitting/lateral leans  with supervision Goal: Pt. Will Transfer To Toilet Flowsheets (Taken 06/06/2021 1036) Pt Will Transfer to Toilet:  with supervision  stand pivot transfer Goal: Pt/Caregiver Will Perform Home Exercise Program Flowsheets (Taken 06/06/2021 1036) Pt/caregiver will Perform Home Exercise Program:  Increased strength  Both right and left upper extremity  With Supervision  Independently  Syrai Gladwin OT, MOT

## 2021-06-06 NOTE — Progress Notes (Signed)
ANTICOAGULATION CONSULT NOTE Pharmacy Consult for Heparin Indication: VTE  Allergies  Allergen Reactions   Celebrex [Celecoxib]     rash   Demerol [Meperidine] Hypertension   Lisinopril     Cough   Statins     Muscle aches   Zyrtec [Cetirizine] Itching    Patient Measurements: Height: 5\' 2"  (157.5 cm) Weight: 62.4 kg (137 lb 9.1 oz) IBW/kg (Calculated) : 50.1 Heparin Dosing Weight: 63 kg  Vital Signs: Temp: 98.6 F (37 C) (01/04 2243) Temp Source: Oral (01/04 2243) BP: 128/78 (01/04 2243) Pulse Rate: 59 (01/04 2243)  Labs: Recent Labs    06/04/21 1645 06/04/21 1943 06/04/21 2343 06/05/21 0330 06/05/21 1409 06/05/21 2134  HGB 9.2*  --   --  8.9*  --   --   HCT 28.9*  --   --  28.3*  --   --   PLT 170  --   --  161  --   --   APTT  --   --   --  33  --   --   HEPARINUNFRC  --   --   --  <0.10* 0.46 0.45  CREATININE 0.89  --   --  0.81  --   --   TROPONINIHS 59* 60* 52* 42*  --   --      Estimated Creatinine Clearance: 48.1 mL/min (by C-G formula based on SCr of 0.81 mg/dL).  Assessment: 81 y.o. female with new PE, portal, splenic, and SMV thrombosis, for heparin.  Heparin level remains therapeutic on 1250 units/hr   Goal of Therapy:  Heparin level 0.3-0.7 units/ml Monitor platelets by anticoagulation protocol: Yes   Plan:  Continue heparin at 1250 units/hr Monitor daily HL, CBC and s/s of bleeding    Albertina Parr, PharmD., BCPS, BCCCP Clinical Pharmacist Please refer to [facility] for unit-specific pharmacist

## 2021-06-06 NOTE — Plan of Care (Signed)
°  Problem: Acute Rehab PT Goals(only PT should resolve) Goal: Pt Will Go Supine/Side To Sit Outcome: Progressing Flowsheets (Taken 06/06/2021 1406) Pt will go Supine/Side to Sit: with minimal assist Goal: Patient Will Transfer Sit To/From Stand Outcome: Progressing Flowsheets (Taken 06/06/2021 1406) Patient will transfer sit to/from stand: with minimal assist Goal: Pt Will Transfer Bed To Chair/Chair To Bed Outcome: Progressing Flowsheets (Taken 06/06/2021 1406) Pt will Transfer Bed to Chair/Chair to Bed: with min assist Goal: Pt Will Ambulate Outcome: Progressing Flowsheets (Taken 06/06/2021 1406) Pt will Ambulate:  25 feet  with minimal assist  with rolling walker  with moderate assist   2:08 PM, 06/06/21 Lonell Grandchild, MPT Physical Therapist with [facility] [phone] office [phone] mobile phone

## 2021-06-06 NOTE — Progress Notes (Signed)
Palliative: Thank you for this consult. Unfortunately there will be a delay in a Palliative Provider seeing this patient. Palliative Medicine will return to service on 06/10/21 and will see patient at that time.  No charge Quinn Axe, NP Palliative Medicine Please call Palliative Medicine team phone with any questions [phone]. For individual providers please see [facility].

## 2021-06-06 NOTE — Progress Notes (Signed)
ANTICOAGULATION CONSULT NOTE Pharmacy Consult for Heparin Indication: VTE  Allergies  Allergen Reactions   Celebrex [Celecoxib]     rash   Demerol [Meperidine] Hypertension   Lisinopril     Cough   Statins     Muscle aches   Zyrtec [Cetirizine] Itching    Patient Measurements: Height: 5\' 2"  (157.5 cm) Weight: 64.9 kg (143 lb 1.3 oz) IBW/kg (Calculated) : 50.1 Heparin Dosing Weight: 63 kg  Vital Signs: Temp: 98.9 F (37.2 C) (01/05 0608) Temp Source: Oral (01/04 2243) BP: 152/59 (01/05 0608) Pulse Rate: 98 (01/05 0608)  Labs: Recent Labs    06/04/21 1645 06/04/21 1645 06/04/21 1943 06/04/21 2343 06/05/21 0330 06/05/21 1409 06/05/21 2134 06/06/21 0541  HGB 9.2*  --   --   --  8.9*  --   --  9.2*  HCT 28.9*  --   --   --  28.3*  --   --  29.2*  PLT 170  --   --   --  161  --   --  188  APTT  --   --   --   --  33  --   --   --   HEPARINUNFRC  --    < >  --   --  <0.10* 0.46 0.45 0.54  CREATININE 0.89  --   --   --  0.81  --   --  0.76  TROPONINIHS 59*  --  60* 52* 42*  --   --   --    < > = values in this interval not displayed.     Estimated Creatinine Clearance: 49.6 mL/min (by C-G formula based on SCr of 0.76 mg/dL).  Assessment: 81 y.o. female with new PE, portal, splenic, and SMV thrombosis, for heparin.  Heparin level remains therapeutic on 1250 units/hr   HL 0.54- therapeutic  Goal of Therapy:  Heparin level 0.3-0.7 units/ml Monitor platelets by anticoagulation protocol: Yes   Plan:  Continue heparin at 1250 units/hr Monitor daily HL, CBC and s/s of bleeding    Margot Ables, PharmD Clinical Pharmacist 06/06/2021 7:54 AM

## 2021-06-06 NOTE — Consult Note (Signed)
Reason for Consult:  T10 FRACTURE  Referring Physician: DR Megan Salon is an 81 y.o. female.  HPI: 81 YO [place] presented to the hospital with increasing epigastric abdominal pain.  In the process of working up the abdominal pain a T10 fracture was noted.  A follow-up CT scan was done and it was felt to be an osteoporotic fracture.  Noted that the patient had a CT scan December 20 for renal stone, there was no fracture in this area at that time  [name] as well as the epigastric and central to lower thoracic back pain  There are no neurologic deficits at present  [name].  She still has a significant scoliosis with foraminal stenosis lumbar spine  Family members are present and according to them the patient has had a recent fall that occurred after her CT scan on 05/21/2021  Past Medical History:  Diagnosis Date   Allergy    Arthritis    "back, right shoulder, hips" (10/14/2012)   Cataract    Complication of anesthesia    "1950's had appendix out; kicked the RN; no problems since" (10/14/2012)   Heart murmur    normal Echo 2014 per pt   History of migraine    1960s   Hyperlipidemia    Hypertension    Osteopenia    Pneumonia    "couple of times; last time in the mid 1990's" (10/14/2012)   TIA (transient ischemic attack) 10/14/2012    Past Surgical History:  Procedure Laterality Date   APPENDECTOMY  1950's   BACK SURGERY     COLONOSCOPY     DILATION AND CURETTAGE OF UTERUS  ~ [place]   "had a disc removed" (10/14/2012)   TONSILLECTOMY  1940's   TOTAL HIP ARTHROPLASTY Right 02/26/2016   Procedure: TOTAL HIP ARTHROPLASTY ANTERIOR APPROACH;  Surgeon: Renette Butters, MD;  Location: [place];  Service: Orthopedics;  Laterality: Right;   TUBAL LIGATION  20's    Family History  Problem  Relation Age of Onset   Heart disease Mother    Diabetes Mother    Arthritis Mother    Vascular Disease Mother    Diabetes Sister    Hypertension Sister    Arthritis Sister    Early death Brother    Vascular Disease Brother    Heart attack Brother    Arthritis Father    Heart attack Brother    Diabetes Sister    Hypertension Sister    Arthritis Sister     Social History:  reports that she quit smoking about 35 years ago. Her smoking use included cigarettes. She has a 1.50 pack-year smoking history. She has never used smokeless tobacco. She reports that she does not currently use alcohol after a past usage of about 1.0 standard drink per week. She reports that she does not use drugs.  Allergies:  Allergies  Allergen Reactions   Celebrex [Celecoxib]     rash   Demerol [Meperidine] Hypertension   Lisinopril     Cough   Statins     Muscle aches   Zyrtec [Cetirizine] Itching    Medications: I have reviewed the patient's current medications.  Results for orders placed or performed during the hospital encounter of 06/04/21 (from the past 48 hour(s))  Troponin I (  High Sensitivity)     Status: Abnormal   Collection Time: 06/04/21  4:45 PM  Result Value Ref Range   Troponin I (High Sensitivity) 59 (H) <18 ng/L    Comment: (NOTE) Elevated high sensitivity troponin I (hsTnI) values and significant  changes across serial measurements may suggest ACS but many other  chronic and acute conditions are known to elevate hsTnI results.  Refer to the "Links" section for chest pain algorithms and additional  guidance. Performed at [facility], [address], [place], [place] [address]   Comprehensive metabolic panel     Status: Abnormal   Collection Time: 06/04/21  4:45 PM  Result Value Ref Range   Sodium 134 (L) 135 - 145 mmol/L   Potassium 4.2 3.5 - 5.1 mmol/L   Chloride 103 98 - 111 mmol/L   CO2 22 22 - 32 mmol/L   Glucose, Bld 106 (H) 70 - 99 mg/dL    Comment: Glucose reference  range applies only to samples taken after fasting for at least 8 hours.   BUN 25 (H) 8 - 23 mg/dL   Creatinine, Ser 0.89 0.44 - 1.00 mg/dL   Calcium 12.6 (H) 8.9 - 10.3 mg/dL   Total Protein 6.7 6.5 - 8.1 g/dL   Albumin 3.3 (L) 3.5 - 5.0 g/dL   AST 42 (H) 15 - 41 U/L   ALT 46 (H) 0 - 44 U/L   Alkaline Phosphatase 58 38 - 126 U/L   Total Bilirubin 0.9 0.3 - 1.2 mg/dL   GFR, Estimated >60 >60 mL/min    Comment: (NOTE) Calculated using the CKD-EPI Creatinine Equation (2021)    Anion gap 9 5 - 15    Comment: Performed at [facility], [address] [place], [place] [address]  CBC with Differential     Status: Abnormal   Collection Time: 06/04/21  4:45 PM  Result Value Ref Range   WBC 9.2 4.0 - 10.5 K/uL   RBC 3.31 (L) 3.87 - 5.11 MIL/uL   Hemoglobin 9.2 (L) 12.0 - 15.0 g/dL   HCT 28.9 (L) 36.0 - 46.0 %   MCV 87.3 80.0 - 100.0 fL   MCH 27.8 26.0 - 34.0 pg   MCHC 31.8 30.0 - 36.0 g/dL   RDW 15.5 11.5 - 15.5 %   Platelets 170 150 - 400 K/uL   nRBC 0.0 0.0 - 0.2 %   Neutrophils Relative % 77 %   Neutro Abs 7.0 1.7 - 7.7 K/uL   Lymphocytes Relative 10 %   Lymphs Abs 1.0 0.7 - 4.0 K/uL   Monocytes Relative 9 %   Monocytes Absolute 0.8 0.1 - 1.0 K/uL   Eosinophils Relative 3 %   Eosinophils Absolute 0.3 0.0 - 0.5 K/uL   Basophils Relative 1 %   Basophils Absolute 0.1 0.0 - 0.1 K/uL   Immature Granulocytes 0 %   Abs Immature Granulocytes 0.04 0.00 - 0.07 K/uL    Comment: Performed at [facility], [address] [place], [place] [address]  D-dimer, quantitative     Status: Abnormal   Collection Time: 06/04/21  4:45 PM  Result Value Ref Range   D-Dimer, Quant 10.85 (H) 0.00 - 0.50 ug/mL-FEU    Comment: (NOTE) At the manufacturer cut-off value of 0.5 g/mL FEU, this assay has a negative predictive value of 95-100%.This assay is intended for use in conjunction with a clinical pretest probability (PTP) assessment model to exclude pulmonary embolism (PE) and deep venous  thrombosis (DVT) in outpatients suspected  of PE or DVT. Results should be correlated with clinical presentation. Performed at [facility], [address], [place], [place] [address]   Brain natriuretic peptide     Status: Abnormal   Collection Time: 06/04/21  4:45 PM  Result Value Ref Range   B Natriuretic Peptide 151.0 (H) 0.0 - 100.0 pg/mL    Comment: Performed at [facility], [address], [place], [place] [address]  Resp Panel by RT-PCR (Flu A&B, Covid) Nasopharyngeal Swab     Status: None   Collection Time: 06/04/21  4:57 PM   Specimen: Nasopharyngeal Swab; Nasopharyngeal(NP) swabs in vial transport medium  Result Value Ref Range   SARS Coronavirus 2 by RT PCR NEGATIVE NEGATIVE    Comment: (NOTE) SARS-CoV-2 target nucleic acids are NOT DETECTED.  The SARS-CoV-2 RNA is generally detectable in upper respiratory specimens during the acute phase of infection. The lowest concentration of SARS-CoV-2 viral copies this assay can detect is 138 copies/mL. A negative result does not preclude SARS-Cov-2 infection and should not be used as the sole basis for treatment or other patient management decisions. A negative result may occur with  improper specimen collection/handling, submission of specimen other than nasopharyngeal swab, presence of viral mutation(s) within the areas targeted by this assay, and inadequate number of viral copies(<138 copies/mL). A negative result must be combined with clinical observations, patient history, and epidemiological information. The expected result is Negative.  Fact Sheet for Patients:  EntrepreneurPulse.com.au  Fact Sheet for Healthcare Providers:  IncredibleEmployment.be  This test is no t yet approved or cleared by the [place] FDA and  has been authorized for detection and/or diagnosis of SARS-CoV-2 by FDA under an Emergency Use Authorization (EUA). This EUA will remain  in effect (meaning this test  can be used) for the duration of the COVID-19 declaration under Section 564(b)(1) of the Act, 21 U.S.C.section 360bbb-3(b)(1), unless the authorization is terminated  or revoked sooner.       Influenza A by PCR NEGATIVE NEGATIVE   Influenza B by PCR NEGATIVE NEGATIVE    Comment: (NOTE) The Xpert Xpress SARS-CoV-2/FLU/RSV plus assay is intended as an aid in the diagnosis of influenza from Nasopharyngeal swab specimens and should not be used as a sole basis for treatment. Nasal washings and aspirates are unacceptable for Xpert Xpress SARS-CoV-2/FLU/RSV testing.  Fact Sheet for Patients: EntrepreneurPulse.com.au  Fact Sheet for Healthcare Providers: IncredibleEmployment.be  This test is not yet approved or cleared by the [place] FDA and has been authorized for detection and/or diagnosis of SARS-CoV-2 by FDA under an Emergency Use Authorization (EUA). This EUA will remain in effect (meaning this test can be used) for the duration of the COVID-19 declaration under Section 564(b)(1) of the Act, 21 U.S.C. section 360bbb-3(b)(1), unless the authorization is terminated or revoked.  Performed at [facility], [address], [place], [place] [address]   Blood gas, venous (at [facility] and [facility], not at [facility])     Status: Abnormal   Collection Time: 06/04/21  5:29 PM  Result Value Ref Range   FIO2 28.00    pH, Ven 7.389 7.250 - 7.430   pCO2, Ven 40.1 (L) 44.0 - 60.0 mmHg   pO2, Ven 37.8 32.0 - 45.0 mmHg   Bicarbonate 23.3 20.0 - 28.0 mmol/L   Acid-base deficit 0.6 0.0 - 2.0 mmol/L   O2 Saturation 58.8 %   Patient temperature 36.8    Collection site LEFT ANTECUBITAL    Drawn by [id number]    Sample type  VENOUS     Comment: Performed at [facility], [address] [place], [place] [address]  Troponin I (High Sensitivity)     Status: Abnormal   Collection Time: 06/04/21  7:43 PM  Result Value Ref Range   Troponin I (High Sensitivity) 60 (H) <18 ng/L     Comment: (NOTE) Elevated high sensitivity troponin I (hsTnI) values and significant  changes across serial measurements may suggest ACS but many other  chronic and acute conditions are known to elevate hsTnI results.  Refer to the "Links" section for chest pain algorithms and additional  guidance. Performed at [facility], [address] [place], [place] [address]   Troponin I (High Sensitivity)     Status: Abnormal   Collection Time: 06/04/21 11:43 PM  Result Value Ref Range   Troponin I (High Sensitivity) 52 (H) <18 ng/L    Comment: (NOTE) Elevated high sensitivity troponin I (hsTnI) values and significant  changes across serial measurements may suggest ACS but many other  chronic and acute conditions are known to elevate hsTnI results.  Refer to the "Links" section for chest pain algorithms and additional  guidance. Performed at [facility], [address], [place], [place] [address]   Heparin level (unfractionated)     Status: Abnormal   Collection Time: 06/05/21  3:30 AM  Result Value Ref Range   Heparin Unfractionated <0.10 (L) 0.30 - 0.70 IU/mL    Comment: (NOTE) The clinical reportable range upper limit is being lowered to >1.10 to align with the FDA approved guidance for the current laboratory assay.  If heparin results are below expected values, and patient dosage has  been confirmed, suggest follow up testing of antithrombin III levels. Performed at [facility], [address], [place], [place] [address]   CBC     Status: Abnormal   Collection Time: 06/05/21  3:30 AM  Result Value Ref Range   WBC 6.4 4.0 - 10.5 K/uL   RBC 3.20 (L) 3.87 - 5.11 MIL/uL   Hemoglobin 8.9 (L) 12.0 - 15.0 g/dL   HCT 28.3 (L) 36.0 - 46.0 %   MCV 88.4 80.0 - 100.0 fL   MCH 27.8 26.0 - 34.0 pg   MCHC 31.4 30.0 - 36.0 g/dL   RDW 15.3 11.5 - 15.5 %   Platelets 161 150 - 400 K/uL   nRBC 0.0 0.0 - 0.2 %    Comment: Performed at [facility] [address], [place],  [address]   Procalcitonin - Baseline     Status: None   Collection Time: 06/05/21  3:30 AM  Result Value Ref Range   Procalcitonin <0.10 ng/mL    Comment:        Interpretation: PCT (Procalcitonin) <= 0.5 ng/mL: Systemic infection (sepsis) is not likely. Local bacterial infection is possible. (NOTE)       Sepsis PCT Algorithm           Lower Respiratory Tract                                      Infection PCT Algorithm    ----------------------------     ----------------------------         PCT < 0.25 ng/mL                PCT < 0.10 ng/mL          Strongly encourage  Strongly discourage   discontinuation of antibiotics    initiation of antibiotics    ----------------------------     -----------------------------       PCT 0.25 - 0.50 ng/mL            PCT 0.10 - 0.25 ng/mL               OR       >80% decrease in PCT            Discourage initiation of                                            antibiotics      Encourage discontinuation           of antibiotics    ----------------------------     -----------------------------         PCT >= 0.50 ng/mL              PCT 0.26 - 0.50 ng/mL               AND        <80% decrease in PCT             Encourage initiation of                                             antibiotics       Encourage continuation           of antibiotics    ----------------------------     -----------------------------        PCT >= 0.50 ng/mL                  PCT > 0.50 ng/mL               AND         increase in PCT                  Strongly encourage                                      initiation of antibiotics    Strongly encourage escalation           of antibiotics                                     -----------------------------                                           PCT <= 0.25 ng/mL                                                 OR                                        >  80% decrease in PCT                                      Discontinue /  Do not initiate                                             antibiotics  Performed at [facility] [place], [place] [address]   Comprehensive metabolic panel     Status: Abnormal   Collection Time: 06/05/21  3:30 AM  Result Value Ref Range   Sodium 136 135 - 145 mmol/L   Potassium 3.9 3.5 - 5.1 mmol/L   Chloride 107 98 - 111 mmol/L   CO2 23 22 - 32 mmol/L   Glucose, Bld 115 (H) 70 - 99 mg/dL    Comment: Glucose reference range applies only to samples taken after fasting for at least 8 hours.   BUN 21 8 - 23 mg/dL   Creatinine, Ser 0.81 0.44 - 1.00 mg/dL   Calcium 12.1 (H) 8.9 - 10.3 mg/dL   Total Protein 5.6 (L) 6.5 - 8.1 g/dL   Albumin 2.8 (L) 3.5 - 5.0 g/dL   AST 32 15 - 41 U/L   ALT 39 0 - 44 U/L   Alkaline Phosphatase 53 38 - 126 U/L   Total Bilirubin 0.6 0.3 - 1.2 mg/dL   GFR, Estimated >60 >60 mL/min    Comment: (NOTE) Calculated using the CKD-EPI Creatinine Equation (2021)    Anion gap 6 5 - 15    Comment: Performed at [facility], [address], [place], [place] [address]  APTT     Status: None   Collection Time: 06/05/21  3:30 AM  Result Value Ref Range   aPTT 33 24 - 36 seconds    Comment: Performed at [facility], [address], [place], [place] [address]  Magnesium     Status: None   Collection Time: 06/05/21  3:30 AM  Result Value Ref Range   Magnesium 1.7 1.7 - 2.4 mg/dL    Comment: Performed at [facility], [address], [place], [place] [address]  Phosphorus     Status: Abnormal   Collection Time: 06/05/21  3:30 AM  Result Value Ref Range   Phosphorus 2.1 (L) 2.5 - 4.6 mg/dL    Comment: Performed at [facility], [address], [place], [place] [address]  Troponin I (High Sensitivity)     Status: Abnormal   Collection Time: 06/05/21  3:30 AM  Result Value Ref Range   Troponin I (High Sensitivity) 42 (H) <18 ng/L    Comment: (NOTE) Elevated high sensitivity troponin I (hsTnI) values and significant  changes across serial  measurements may suggest ACS but many other  chronic and acute conditions are known to elevate hsTnI results.  Refer to the "Links" section for chest pain algorithms and additional  guidance. Performed at [facility], [address], [place], [place] [address]   Heparin level (unfractionated)     Status: None   Collection Time: 06/05/21  2:09 PM  Result Value Ref Range   Heparin Unfractionated 0.46 0.30 - 0.70 IU/mL    Comment: (NOTE) The clinical reportable range upper limit is being lowered to >1.10 to align with the FDA approved guidance for the current laboratory  assay.  If heparin results are below expected values, and patient dosage has  been confirmed, suggest follow up testing of antithrombin III levels. Performed at [facility], [address], [place], [place] [address]   Heparin level (unfractionated)     Status: None   Collection Time: 06/05/21  9:34 PM  Result Value Ref Range   Heparin Unfractionated 0.45 0.30 - 0.70 IU/mL    Comment: (NOTE) The clinical reportable range upper limit is being lowered to >1.10 to align with the FDA approved guidance for the current laboratory assay.  If heparin results are below expected values, and patient dosage has  been confirmed, suggest follow up testing of antithrombin III levels. Performed at [facility], [address], [place], [place] [address]   Heparin level (unfractionated)     Status: None   Collection Time: 06/06/21  5:41 AM  Result Value Ref Range   Heparin Unfractionated 0.54 0.30 - 0.70 IU/mL    Comment: (NOTE) The clinical reportable range upper limit is being lowered to >1.10 to align with the FDA approved guidance for the current laboratory assay.  If heparin results are below expected values, and patient dosage has  been confirmed, suggest follow up testing of antithrombin III levels. Performed at [facility], [address], [place], [place] [address]   CBC     Status: Abnormal   Collection Time:  06/06/21  5:41 AM  Result Value Ref Range   WBC 10.9 (H) 4.0 - 10.5 K/uL   RBC 3.29 (L) 3.87 - 5.11 MIL/uL   Hemoglobin 9.2 (L) 12.0 - 15.0 g/dL   HCT 29.2 (L) 36.0 - 46.0 %   MCV 88.8 80.0 - 100.0 fL   MCH 28.0 26.0 - 34.0 pg   MCHC 31.5 30.0 - 36.0 g/dL   RDW 15.5 11.5 - 15.5 %   Platelets 188 150 - 400 K/uL   nRBC 0.0 0.0 - 0.2 %    Comment: Performed at [facility], [address] [place], [place] [address]  Basic metabolic panel     Status: Abnormal   Collection Time: 06/06/21  5:41 AM  Result Value Ref Range   Sodium 135 135 - 145 mmol/L   Potassium 3.8 3.5 - 5.1 mmol/L   Chloride 105 98 - 111 mmol/L   CO2 21 (L) 22 - 32 mmol/L   Glucose, Bld 97 70 - 99 mg/dL    Comment: Glucose reference range applies only to samples taken after fasting for at least 8 hours.   BUN 14 8 - 23 mg/dL   Creatinine, Ser 0.76 0.44 - 1.00 mg/dL   Calcium 12.1 (H) 8.9 - 10.3 mg/dL   GFR, Estimated >60 >60 mL/min    Comment: (NOTE) Calculated using the CKD-EPI Creatinine Equation (2021)    Anion gap 9 5 - 15    Comment: Performed at [facility], [address], [place], [place] [address]    CT Angio Chest PE W and/or Wo Contrast  Result Date: 06/04/2021 CLINICAL DATA:  Epigastric pain.  Pancreatic malignancy. EXAM: CT ANGIOGRAPHY CHEST CT ABDOMEN AND PELVIS WITH CONTRAST TECHNIQUE: Multidetector CT imaging of the chest was performed using the standard protocol during bolus administration of intravenous contrast. Multiplanar CT image reconstructions and MIPs were obtained to evaluate the vascular anatomy. Multidetector CT imaging of the abdomen and pelvis was performed using the standard protocol during bolus administration of intravenous contrast. CONTRAST:  31mL OMNIPAQUE IOHEXOL 350 MG/ML SOLN COMPARISON:  CT renal stone 05/21/2021. FINDINGS:  CTA CHEST FINDINGS Cardiovascular: The heart and aorta are normal in size. There are atherosclerotic calcifications of the aorta. There is no pericardial  effusion. There is adequate opacification of the pulmonary arteries to the segmental level. There are segmental and subsegmental right lower lobe pulmonary emboli. There are subsegmental right middle lobe pulmonary emboli. Mediastinum/Nodes: There is a moderate hiatal hernia. The esophagus is nondilated. There are no enlarged mediastinal or hilar lymph nodes. Lungs/Pleura: There is a trace right pleural effusion. There is bilateral lower lung atelectasis. There is a small amount of patchy airspace and ground-glass opacity in the right lower lobe. Trachea and central airways are patent. No pneumothorax. No suspicious pulmonary nodules. Musculoskeletal: No chest wall abnormality. No acute or significant osseous findings. There is moderate compression fracture of T10 which is new from the prior examination. No axillary lymphadenopathy. Review of the MIP images confirms the above findings. CT ABDOMEN and PELVIS FINDINGS Hepatobiliary: There is a 15 mm hypodense lesion in the left lobe of the liver image 2/19. There is a similar appearing hypodense lesion measuring 2 cm in the left lobe of the liver image 2/32. Small left hepatic cyst in the left lobe measures 9 mm. No gallstones are identified. Gallbladder sludge is likely present. No definite biliary ductal dilatation. Pancreas: Lobulated low-density mass in the region of the body and tail the pancreas measures 5.8 x 11.4 x 5.5 cm. Low-attenuation lesion in the head of the pancreas versus dilated duct seen on image 2/26 measuring 15 mm. There is loss of fat plane between the lesser curvature of the stomach. This mass encompasses the celiac axis and its proximal branches, possibly causing occlusion. Superior mesenteric vein not definitely involved. 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 Splenic vein not visualized and may be occluded as well. Spleen: There is small wedge-shaped area of hypodensity in  the spleen suspicious for infarct. No perisplenic fluid collection identified. The spleen is nonenlarged. Adrenals/Urinary Tract: The bilateral adrenal glands are within normal limits. There are punctate nonobstructing right renal calculi. There is no hydronephrosis in either kidney. There is a 5.2 by 3.8 cm cyst in the right kidney. There is an additional cortical hypodensity in the right kidney which is too small to characterize image 2/23. This likely also represents cyst. Bladder is grossly within normal limits, but evaluation is limited secondary to streak artifact in the pelvis. Stomach/Bowel: No evidence for bowel obstruction, free air or pneumatosis. There is a large amount of stool throughout the colon. The appendix is not seen. Small bowel loops are within normal limits. Pancreatic mass abuts the body of the stomach with loss of fat plane. The stomach is otherwise normal. There is a moderate-sized hiatal hernia. Vascular/Lymphatic: Aorta and IVC are normal in size. There are atherosclerotic calcifications of the aorta. No discrete enlarged lymph nodes are identified. Reproductive: Uterus and bilateral adnexa are unremarkable. Other: There is a small amount of free fluid in the pelvis. There is a small fat containing umbilical hernia. Musculoskeletal: Degenerative changes affect the spine. No acute fractures are seen. No focal osseous lesions are seen. Right hip arthroplasty is present. Review of the MIP images confirms the above findings. IMPRESSION: 1. Segmental and subsegmental right lower lobe pulmonary emboli. Subsegmental right middle lobe pulmonary emboli. (RV/LV Ratio = 1.0) 2. Trace right pleural effusion. 3. Minimal patchy airspace disease and ground-glass opacities in the right lower lobe suspicious for infection. 4. T10 compression fracture  is new from May 21, 2021. 5. Large mass involving the pancreatic body and tail most compatible with primary pancreatic neoplasm. Questionable ductal  dilatation in the head of the pancreas versus second smaller lesion. 6. 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 7. 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 8. Likely small splenic infarct. 9. There are 2 suspicious lesions in the left lobe of the liver, likely metastatic disease. 10. Nonobstructing right renal calculi. 11. Small amount of free fluid in the pelvis. 12. Large stool burden. 13.  Aortic Atherosclerosis (ICD10-I70.0). These results were called by telephone at the time of interpretation on 06/04/2021 at 7:28 pm to provider Wynona Dove , who verbally acknowledged these results. Electronically Signed   By: Ronney Asters M.D.   On: 06/04/2021 19:32   CT ABDOMEN PELVIS W CONTRAST  Result Date: 06/04/2021 CLINICAL DATA:  Epigastric pain.  Pancreatic malignancy. EXAM: CT ANGIOGRAPHY CHEST CT ABDOMEN AND PELVIS WITH CONTRAST TECHNIQUE: Multidetector CT imaging of the chest was performed using the standard protocol during bolus administration of intravenous contrast. Multiplanar CT image reconstructions and MIPs were obtained to evaluate the vascular anatomy. Multidetector CT imaging of the abdomen and pelvis was performed using the standard protocol during bolus administration of intravenous contrast. CONTRAST:  27mL OMNIPAQUE IOHEXOL 350 MG/ML SOLN COMPARISON:  CT renal stone 05/21/2021. FINDINGS: CTA CHEST FINDINGS Cardiovascular: The heart and aorta are normal in size. There are atherosclerotic calcifications of the aorta. There is no pericardial effusion. There is adequate opacification of the pulmonary arteries to the segmental level. There are segmental and subsegmental right lower lobe pulmonary emboli. There are subsegmental right middle lobe pulmonary emboli. Mediastinum/Nodes: There is a moderate hiatal hernia. The esophagus is nondilated. There are no enlarged mediastinal or hilar lymph nodes.  Lungs/Pleura: There is a trace right pleural effusion. There is bilateral lower lung atelectasis. There is a small amount of patchy airspace and ground-glass opacity in the right lower lobe. Trachea and central airways are patent. No pneumothorax. No suspicious pulmonary nodules. Musculoskeletal: No chest wall abnormality. No acute or significant osseous findings. There is moderate compression fracture of T10 which is new from the prior examination. No axillary lymphadenopathy. Review of the MIP images confirms the above findings. CT ABDOMEN and PELVIS FINDINGS Hepatobiliary: There is a 15 mm hypodense lesion in the left lobe of the liver image 2/19. There is a similar appearing hypodense lesion measuring 2 cm in the left lobe of the liver image 2/32. Small left hepatic cyst in the left lobe measures 9 mm. No gallstones are identified. Gallbladder sludge is likely present. No definite biliary ductal dilatation. Pancreas: Lobulated low-density mass in the region of the body and tail the pancreas measures 5.8 x 11.4 x 5.5 cm. Low-attenuation lesion in the head of the pancreas versus dilated duct seen on image 2/26 measuring 15 mm. There is loss of fat plane between the lesser curvature of the stomach. This mass encompasses the celiac axis and its proximal branches, possibly causing occlusion. Superior mesenteric vein not definitely involved. 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 Splenic vein not visualized and may be occluded as well. Spleen: There is small wedge-shaped area of hypodensity in the spleen suspicious for infarct. No perisplenic fluid collection identified. The spleen is nonenlarged. Adrenals/Urinary Tract: The bilateral adrenal glands are within normal limits. There are punctate nonobstructing  right renal calculi. There is no hydronephrosis in either kidney. There is a 5.2 by 3.8 cm cyst in the right kidney. There is an additional  cortical hypodensity in the right kidney which is too small to characterize image 2/23. This likely also represents cyst. Bladder is grossly within normal limits, but evaluation is limited secondary to streak artifact in the pelvis. Stomach/Bowel: No evidence for bowel obstruction, free air or pneumatosis. There is a large amount of stool throughout the colon. The appendix is not seen. Small bowel loops are within normal limits. Pancreatic mass abuts the body of the stomach with loss of fat plane. The stomach is otherwise normal. There is a moderate-sized hiatal hernia. Vascular/Lymphatic: Aorta and IVC are normal in size. There are atherosclerotic calcifications of the aorta. No discrete enlarged lymph nodes are identified. Reproductive: Uterus and bilateral adnexa are unremarkable. Other: There is a small amount of free fluid in the pelvis. There is a small fat containing umbilical hernia. Musculoskeletal: Degenerative changes affect the spine. No acute fractures are seen. No focal osseous lesions are seen. Right hip arthroplasty is present. Review of the MIP images confirms the above findings. IMPRESSION: 1. Segmental and subsegmental right lower lobe pulmonary emboli. Subsegmental right middle lobe pulmonary emboli. (RV/LV Ratio = 1.0) 2. Trace right pleural effusion. 3. Minimal patchy airspace disease and ground-glass opacities in the right lower lobe suspicious for infection. 4. T10 compression fracture is new from May 21, 2021. 5. Large mass involving the pancreatic body and tail most compatible with primary pancreatic neoplasm. Questionable ductal dilatation in the head of the pancreas versus second smaller lesion. 6. 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 7. 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 8. Likely small splenic infarct. 9. There are 2 suspicious lesions in the left lobe of the liver,  likely metastatic disease. 10. Nonobstructing right renal calculi. 11. Small amount of free fluid in the pelvis. 12. Large stool burden. 13.  Aortic Atherosclerosis (ICD10-I70.0). These results were called by telephone at the time of interpretation on 06/04/2021 at 7:28 pm to provider Wynona Dove , who verbally acknowledged these results. Electronically Signed   By: Ronney Asters M.D.   On: 06/04/2021 19:32   CT T-SPINE NO CHARGE  Result Date: 06/04/2021 CLINICAL DATA:  Initial evaluation for acute trauma, fall. EXAM: CT Thoracic and Lumbar spine  Contrast TECHNIQUE: Multiplanar CT images of the thoracic and lumbar spine were reconstructed from contemporary CT of the Chest, Abdomen, and Pelvis CONTRAST:  None or No additional COMPARISON:  Comparison made with concomitant CT of the chest, abdomen, and pelvis. FINDINGS: CT THORACIC SPINE FINDINGS Alignment: Exaggeration of the normal thoracic kyphosis. No listhesis. Vertebrae: There is an acute compression fracture involving the T10 vertebral body. Associated height loss measures up to 50% with 3 mm bony retropulsion. No significant stenosis. Otherwise, vertebral body height maintained with no other acute or chronic fracture. Visualized ribs intact. No worrisome osseous lesions. Paraspinal and other soft tissues: Paraspinous soft tissues demonstrate no acute finding. Small layering right pleural effusion with associated atelectasis. Previously identified pancreatic neoplasm partially visualize, better evaluated on concomitant CT of the abdomen and pelvis. Hiatal hernia noted. Scattered aortic atherosclerosis. Acute pulmonary embolism within right lower lobe segmental branches, also better evaluated on concomitant chest CT. Disc levels: No significant disc pathology or stenosis seen within the thoracic spine. CT LUMBAR SPINE FINDINGS Segmentation: Standard. Lowest well-formed disc space  labeled the L5-S1 level. Alignment: Moderate levoscoliosis with apex at L2-3.  Alignment otherwise normal preservation of the normal lumbar lordosis. Vertebrae: Vertebral body height maintained without acute or chronic fracture. Visualized sacrum and pelvis intact. No worrisome osseous lesions. Paraspinal and other soft tissues: Paraspinous soft tissues demonstrate no acute finding. 5.4 cm parapelvic cyst noted within the right kidney. Nonobstructive right renal nephrolithiasis noted. Aorto bi-iliac atherosclerotic disease. Disc levels: Mild multilevel degenerative spondylosis, most pronounced at L1-2, L4-5, and L5-S1. Lower lumbar facet hypertrophy. No significant spinal stenosis. Moderate bilateral L5 foraminal narrowing. IMPRESSION: CT THORACIC SPINE IMPRESSION: 1. Compression fracture involving the T10 vertebral body with up to 50% height loss and 3 mm bony retropulsion, new as compared to 05/21/2021, consistent with an acute/subacute finding. No significant stenosis. 2. No other acute traumatic injury within the thoracic spine. 3. Acute right-sided pulmonary emboli, better evaluated on concomitant CT of the chest. 4. Small layering right pleural effusion with associated atelectasis. CT LUMBAR SPINE IMPRESSION: 1. No acute traumatic injury within the lumbar spine. 2. Moderate levoscoliosis with apex at L2-3. 3. Mild multilevel degenerative spondylosis, most pronounced at L1-2, L4-5, and L5-S1. Moderate bilateral L5 foraminal narrowing. 4. Nonobstructive right renal nephrolithiasis. 5. Aortic Atherosclerosis (ICD10-I70.0). Results regarding the acute pulmonary emboli were communicated to the ordering clinician by Dr. Ronney Asters, documented on corresponding CTA of the chest. Electronically Signed   By: Jeannine Boga M.D.   On: 06/04/2021 19:45   CT L-SPINE NO CHARGE  Result Date: 06/04/2021 CLINICAL DATA:  Initial evaluation for acute trauma, fall. EXAM: CT Thoracic and Lumbar spine  Contrast TECHNIQUE: Multiplanar CT images of the thoracic and lumbar spine were reconstructed  from contemporary CT of the Chest, Abdomen, and Pelvis CONTRAST:  None or No additional COMPARISON:  Comparison made with concomitant CT of the chest, abdomen, and pelvis. FINDINGS: CT THORACIC SPINE FINDINGS Alignment: Exaggeration of the normal thoracic kyphosis. No listhesis. Vertebrae: There is an acute compression fracture involving the T10 vertebral body. Associated height loss measures up to 50% with 3 mm bony retropulsion. No significant stenosis. Otherwise, vertebral body height maintained with no other acute or chronic fracture. Visualized ribs intact. No worrisome osseous lesions. Paraspinal and other soft tissues: Paraspinous soft tissues demonstrate no acute finding. Small layering right pleural effusion with associated atelectasis. Previously identified pancreatic neoplasm partially visualize, better evaluated on concomitant CT of the abdomen and pelvis. Hiatal hernia noted. Scattered aortic atherosclerosis. Acute pulmonary embolism within right lower lobe segmental branches, also better evaluated on concomitant chest CT. Disc levels: No significant disc pathology or stenosis seen within the thoracic spine. CT LUMBAR SPINE FINDINGS Segmentation: Standard. Lowest well-formed disc space labeled the L5-S1 level. Alignment: Moderate levoscoliosis with apex at L2-3. Alignment otherwise normal preservation of the normal lumbar lordosis. Vertebrae: Vertebral body height maintained without acute or chronic fracture. Visualized sacrum and pelvis intact. No worrisome osseous lesions. Paraspinal and other soft tissues: Paraspinous soft tissues demonstrate no acute finding. 5.4 cm parapelvic cyst noted within the right kidney. Nonobstructive right renal nephrolithiasis noted. Aorto bi-iliac atherosclerotic disease. Disc levels: Mild multilevel degenerative spondylosis, most pronounced at L1-2, L4-5, and L5-S1. Lower lumbar facet hypertrophy. No significant spinal stenosis. Moderate bilateral L5 foraminal  narrowing. IMPRESSION: CT THORACIC SPINE IMPRESSION: 1. Compression fracture involving the T10 vertebral body with up to 50% height loss and 3 mm bony retropulsion, new as compared to 05/21/2021, consistent with an acute/subacute finding. No significant stenosis. 2. No other acute traumatic injury within the thoracic spine.  3. Acute right-sided pulmonary emboli, better evaluated on concomitant CT of the chest. 4. Small layering right pleural effusion with associated atelectasis. CT LUMBAR SPINE IMPRESSION: 1. No acute traumatic injury within the lumbar spine. 2. Moderate levoscoliosis with apex at L2-3. 3. Mild multilevel degenerative spondylosis, most pronounced at L1-2, L4-5, and L5-S1. Moderate bilateral L5 foraminal narrowing. 4. Nonobstructive right renal nephrolithiasis. 5. Aortic Atherosclerosis (ICD10-I70.0). Results regarding the acute pulmonary emboli were communicated to the ordering clinician by Dr. Ronney Asters, documented on corresponding CTA of the chest. Electronically Signed   By: Jeannine Boga M.D.   On: 06/04/2021 19:45   DG Chest Port 1 View  Result Date: 06/04/2021 CLINICAL DATA:  Shortness of breath.  Back pain. EXAM: PORTABLE CHEST 1 VIEW COMPARISON:  Two-view chest x-ray 01/22/2016 FINDINGS: Heart size upper limits of normal, exaggerated by low lung volumes. Atherosclerotic changes are present in the arch. Mild atelectasis or scarring is present both bases. No edema or effusion is present airspace consolidation is present. Bony thorax is within normal limits. IMPRESSION: 1. Low lung volumes. 2. Mild bibasilar atelectasis or scarring. 3. Atherosclerosis. Electronically Signed   By: San Morelle M.D.   On: 06/04/2021 17:44   ECHOCARDIOGRAM COMPLETE  Result Date: 06/05/2021    ECHOCARDIOGRAM REPORT   Patient Name:   Andrea Moreno Date of Exam: 06/05/2021 Medical Rec #:  [id number]         Height:       62.0 in Accession #:    [id number]        Weight:       137.6 lb Date of  Birth:  Sep 23, 1940         BSA:          1.631 m Patient Age:    68 years          BP:           136/52 mmHg Patient Gender: F                 HR:           88 bpm. Exam Location:  [facility] Procedure: 2D Echo, Cardiac Doppler and Color Doppler Indications:    Pulmonary Embolus  History:        Patient has prior history of Echocardiogram examinations, most                 recent 10/15/2012. TIA, Arrythmias:Bradycardia; Risk                 Factors:Hypertension, Dyslipidemia and Former Smoker.  Sonographer:    Wenda Low Referring Phys: [id number] OLADAPO ADEFESO IMPRESSIONS  1. Left ventricular ejection fraction, by estimation, is 60 to 65%. The left ventricle has normal function. The left ventricle has no regional wall motion abnormalities. Left ventricular diastolic parameters are consistent with Grade I diastolic dysfunction (impaired relaxation).  2. Right ventricular systolic function is normal. The right ventricular size is normal. Tricuspid regurgitation signal is inadequate for assessing PA pressure.  3. Left atrial size was mildly dilated.  4. The mitral valve is normal in structure. No evidence of mitral valve regurgitation. No evidence of mitral stenosis.  5. The aortic valve is tricuspid. Aortic valve regurgitation is trivial. No aortic stenosis is present. FINDINGS  Left Ventricle: Left ventricular ejection fraction, by estimation, is 60 to 65%. The left ventricle has normal function. The left ventricle has no regional wall motion abnormalities. The left ventricular internal cavity size was normal in  size. There is  no left ventricular hypertrophy. Left ventricular diastolic parameters are consistent with Grade I diastolic dysfunction (impaired relaxation). Normal left ventricular filling pressure. Right Ventricle: The right ventricular size is normal. No increase in right ventricular wall thickness. Right ventricular systolic function is normal. Tricuspid regurgitation signal is inadequate for  assessing PA pressure. Left Atrium: Left atrial size was mildly dilated. Right Atrium: Right atrial size was normal in size. Pericardium: There is no evidence of pericardial effusion. Mitral Valve: The mitral valve is normal in structure. No evidence of mitral valve regurgitation. No evidence of mitral valve stenosis. MV peak gradient, 4.5 mmHg. The mean mitral valve gradient is 2.0 mmHg. Tricuspid Valve: The tricuspid valve is normal in structure. Tricuspid valve regurgitation is trivial. No evidence of tricuspid stenosis. Aortic Valve: The aortic valve is tricuspid. Aortic valve regurgitation is trivial. No aortic stenosis is present. Aortic valve mean gradient measures 4.0 mmHg. Aortic valve peak gradient measures 10.6 mmHg. Aortic valve area, by VTI measures 2.10 cm. Pulmonic Valve: The pulmonic valve was not well visualized. Pulmonic valve regurgitation is not visualized. No evidence of pulmonic stenosis. Aorta: The aortic root is normal in size and structure. Venous: The inferior vena cava was not well visualized. IAS/Shunts: The interatrial septum was not well visualized.  LEFT VENTRICLE PLAX 2D LVIDd:         4.40 cm     Diastology LVIDs:         2.90 cm     LV e' medial:    7.40 cm/s LV PW:         1.00 cm     LV E/e' medial:  9.7 LV IVS:        1.00 cm     LV e' lateral:   5.66 cm/s LVOT diam:     1.90 cm     LV E/e' lateral: 12.7 LV SV:         64 LV SV Index:   39 LVOT Area:     2.84 cm  LV Volumes (MOD) LV vol d, MOD A2C: 40.0 ml LV vol d, MOD A4C: 33.3 ml LV vol s, MOD A2C: 17.2 ml LV vol s, MOD A4C: 14.1 ml LV SV MOD A2C:     22.8 ml LV SV MOD A4C:     33.3 ml LV SV MOD BP:      21.6 ml RIGHT VENTRICLE RV Basal diam:  3.25 cm RV Mid diam:    2.90 cm RV S prime:     14.10 cm/s TAPSE (M-mode): 3.7 cm LEFT ATRIUM             Index        RIGHT ATRIUM           Index LA diam:        4.10 cm 2.51 cm/m   RA Area:     11.90 cm LA Vol (A2C):   60.8 ml 37.29 ml/m  RA Volume:   27.50 ml  16.86 ml/m LA Vol  (A4C):   57.4 ml 35.20 ml/m LA Biplane Vol: 59.4 ml 36.43 ml/m  AORTIC VALVE                    PULMONIC VALVE AV Area (Vmax):    2.10 cm     PV Vmax:       1.14 m/s AV Area (Vmean):   2.06 cm     PV Peak grad:  5.2 mmHg AV Area (  VTI):     2.10 cm AV Vmax:           163.00 cm/s AV Vmean:          92.700 cm/s AV VTI:            0.306 m AV Peak Grad:      10.6 mmHg AV Mean Grad:      4.0 mmHg LVOT Vmax:         121.00 cm/s LVOT Vmean:        67.400 cm/s LVOT VTI:          0.227 m LVOT/AV VTI ratio: 0.74  AORTA Ao Root diam: 3.00 cm MITRAL VALVE MV Area (PHT): 3.33 cm    SHUNTS MV Area VTI:   2.43 cm    Systemic VTI:  0.23 m MV Peak grad:  4.5 mmHg    Systemic Diam: 1.90 cm MV Mean grad:  2.0 mmHg MV Vmax:       1.06 m/s MV Vmean:      56.4 cm/s MV Decel Time: 228 msec MV E velocity: 71.60 cm/s MV A velocity: 97.70 cm/s MV E/A ratio:  0.73 Carlyle Dolly MD Electronically signed by Carlyle Dolly MD Signature Date/Time: 06/05/2021/3:20:32 PM    Final     Review of Systems  Reason unable to perform ROS: Medicated.  Blood pressure (!) 177/58, pulse 89, temperature (!) 97.2 F (36.2 C), temperature source Oral, resp. rate 16, height 5\' 2"  (1.575 m), weight 64.9 kg, SpO2 100 %. Physical Exam Constitutional:      Appearance: She is underweight.  HENT:     Head: Normocephalic and atraumatic. No raccoon eyes, Battle's sign, contusion, masses, right periorbital erythema, left periorbital erythema or laceration.  Eyes:     General: Lids are normal. No scleral icterus.       Right eye: No foreign body or discharge.        Left eye: No foreign body or discharge.     Extraocular Movements:     Right eye: Normal extraocular motion.     Left eye: Normal extraocular motion.     Conjunctiva/sclera:     Right eye: Right conjunctiva is not injected. No exudate or hemorrhage.    Left eye: Left conjunctiva is not injected. No exudate or hemorrhage. Cardiovascular:     Rate and Rhythm: Normal rate.     Pulses:  Normal pulses.  Pulmonary:     Effort: No tachypnea, bradypnea, accessory muscle usage, prolonged expiration or respiratory distress.  Chest:     Chest wall: No deformity.  Abdominal:     General: There is no distension.  Musculoskeletal:     Right lower leg: No edema.     Left lower leg: No edema.     Comments: Brace is present over the thoracolumbar spine appears to be fitting appropriately when supine but needs adjustment when patient sitting up  Feet:     Right foot:     Skin integrity: No ulcer.     Left foot:     Skin integrity: No ulcer.  Skin:    Coloration: Skin is not pale.     Findings: No abrasion or wound.  Neurological:     Mental Status: She is easily aroused. She is lethargic.     Cranial Nerves: No cranial nerve deficit.     Sensory: No sensory deficit.     Motor: No weakness, tremor, atrophy, abnormal muscle tone or seizure activity.  Psychiatric:  Mood and Affect: Mood normal.        Speech: Speech normal.        Behavior: Behavior is cooperative.        Cognition and Memory: Cognition is not impaired. Memory is not impaired.    Assessment/Plan: CT scan from May 21, 2021 the T10 vertebrae is visible at the top of the image and there is no evidence of compression fracture  CT scan done in the hospital on this occasion shows a vertebral body compression fracture of T10 with 2 to 3 mm of retropulsion   All evidence points to an acute to subacute fracture of thoracic vertebrae #10  Appropriate treatment is a TLSO brace with the patient already has.  This does not appear to be a metastatic lesion.  Arther Abbott 06/06/2021, 2:19 PM

## 2021-06-06 NOTE — Progress Notes (Addendum)
Progress Note    Andrea Moreno  MHD:[id number] DOB: July 06, 1940  DOA: 06/04/2021 PCP: Sharion Balloon, FNP      Brief Narrative:    Medical records reviewed and are as summarized below:  Andrea Moreno is a 81 y.o. female with medical history significant for pancreatic cancer, hypertension, GERD, who presented to the hospital with back pain and shortness of breath.  She has had chronic back pain for about 3 months now and has had no relief with analgesics at home.  However, she noticed significant worsening of her back pain for about 1 week prior to admission.      Assessment/Plan:   Principal Problem:   Pulmonary embolism (HCC) Active Problems:   Essential hypertension   Chronic back pain   Spondylosis of lumbar spine   Pleural effusion on right   Atelectasis   Pancreatic cancer (HCC)   Portal vein thrombosis   Liver lesion, left lobe   Splenic infarct   Acute respiratory failure with hypoxia (HCC)   Elevated d-dimer   Hypercalcemia   Elevated troponin   Elevated brain natriuretic peptide (BNP) level   Hyperglycemia   Acute pulmonary embolism (HCC)   Compression fracture of T10 vertebra (HCC)   Body mass index is 26.17 kg/m.  Acute hypoxemic respiratory failure: Resolved  Acute pulmonary embolism: Discontinue IV heparin drip and transition to Eliquis.    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  Acute on chronic back pain, T10 compression fracture, lumbar spine spondylosis: Continue with TLSO brace: Continue analgesics as needed for pain.  Appreciate input from Dr. Aline Brochure, orthopedic surgeon.  Hypercalcemia: This is likely from malignancy.  Continue IV fluids  Poor oral intake: Continue IV fluids.  Pancreatic cancer, hepatic lesions suspicious for metastatic disease: Outpatient follow-up with oncologist.  Family is  contemplating hospice.  Consult palliative care team and hospice team.  Mildly elevated troponins: This is probably from acute pulmonary embolism.  2D echo showed EF estimated at 60 to 21%, grade 1 diastolic dysfunction.  PT and OT recommended discharge to SNF.  Follow-up with social worker to assist with disposition.  Plan of care was discussed with her sister, Andrea Moreno, over the phone.   Diet Order             Diet Heart Room service appropriate? Yes; Fluid consistency: Thin  Diet effective now                      Consultants: Orthopedic surgeon  Procedures: None    Medications:    apixaban  10 mg Oral BID   Followed by   Derrill Memo ON 06/13/2021] apixaban  5 mg Oral BID   aspirin  325 mg Oral Daily   Continuous Infusions:  methocarbamol (ROBAXIN) IV       Anti-infectives (From admission, onward)    None              Family Communication/Anticipated D/C date and plan/Code Status   DVT prophylaxis: SCDs Start: 06/04/21 2355 apixaban (ELIQUIS) tablet 10 mg  apixaban (ELIQUIS) tablet 5 mg     Code Status: Full Code  Family Communication: Plan discussed with Andrea Moreno, sister Disposition Plan: Plan to discharge home in 1 to 2 days   Status is: Inpatient  Remains inpatient appropriate because: Will need placement to SNF           Subjective:  C/o back pain  Objective:    Vitals:   06/05/21 2246 06/06/21 0500 06/06/21 0608 06/06/21 1325  BP:   (!) 152/59 (!) 177/58  Pulse:   98 89  Resp:   19 16  Temp:   98.9 F (37.2 C) (!) 97.2 F (36.2 C)  TempSrc:    Oral  SpO2: 99%  99% 100%  Weight:  64.9 kg    Height:       No data found.   Intake/Output Summary (Last 24 hours) at 06/06/2021 1610 Last data filed at 06/06/2021 1500 Gross per 24 hour  Intake 3217.44 ml  Output 200 ml  Net 3017.44 ml   Filed Weights   06/04/21 2252 06/05/21 0538 06/06/21 0500  Weight: 61.3 kg 62.4 kg 64.9 kg    Exam:  GEN: NAD sitting up in  the chair with out TLSO brace in place SKIN: No rash EYES: EOMI ENT: MMM CV: RRR PULM: CTA B ABD: soft, ND, NT, +BS CNS: AAO x 2 (person and place), confused, non focal EXT: No edema or tenderness MSK: Lumbar spinal tenderness        Data Reviewed:   I have personally reviewed following labs and imaging studies:  Labs: Labs show the following:   Basic Metabolic Panel: Recent Labs  Lab 06/04/21 1645 06/05/21 0330 06/06/21 0541  NA 134* 136 135  K 4.2 3.9 3.8  CL 103 107 105  CO2 22 23 21*  GLUCOSE 106* 115* 97  BUN 25* 21 14  CREATININE 0.89 0.81 0.76  CALCIUM 12.6* 12.1* 12.1*  MG  --  1.7  --   PHOS  --  2.1*  --    GFR Estimated Creatinine Clearance: 49.6 mL/min (by C-G formula based on SCr of 0.76 mg/dL). Liver Function Tests: Recent Labs  Lab 06/04/21 1645 06/05/21 0330  AST 42* 32  ALT 46* 39  ALKPHOS 58 53  BILITOT 0.9 0.6  PROT 6.7 5.6*  ALBUMIN 3.3* 2.8*   No results for input(s): LIPASE, AMYLASE in the last 168 hours. No results for input(s): AMMONIA in the last 168 hours. Coagulation profile No results for input(s): INR, PROTIME in the last 168 hours.  CBC: Recent Labs  Lab 06/04/21 1645 06/05/21 0330 06/06/21 0541  WBC 9.2 6.4 10.9*  NEUTROABS 7.0  --   --   HGB 9.2* 8.9* 9.2*  HCT 28.9* 28.3* 29.2*  MCV 87.3 88.4 88.8  PLT 170 161 188   Cardiac Enzymes: No results for input(s): CKTOTAL, CKMB, CKMBINDEX, TROPONINI in the last 168 hours. BNP (last 3 results) No results for input(s): PROBNP in the last 8760 hours. CBG: No results for input(s): GLUCAP in the last 168 hours. D-Dimer: Recent Labs    06/04/21 1645  DDIMER 10.85*   Hgb A1c: No results for input(s): HGBA1C in the last 72 hours. Lipid Profile: No results for input(s): CHOL, HDL, LDLCALC, TRIG, CHOLHDL, LDLDIRECT in the last 72 hours. Thyroid function studies: No results for input(s): TSH, T4TOTAL, T3FREE, THYROIDAB in the last 72 hours.  Invalid input(s):  FREET3 Anemia work up: No results for input(s): VITAMINB12, FOLATE, FERRITIN, TIBC, IRON, RETICCTPCT in the last 72 hours. Sepsis Labs: Recent Labs  Lab 06/04/21 1645 06/05/21 0330 06/06/21 0541  PROCALCITON  --  <0.10  --   WBC 9.2 6.4 10.9*    Microbiology Recent Results (from the past 240 hour(s))  Resp Panel by RT-PCR (Flu A&B, Covid) Nasopharyngeal Swab     Status: None  Collection Time: 06/04/21  4:57 PM   Specimen: Nasopharyngeal Swab; Nasopharyngeal(NP) swabs in vial transport medium  Result Value Ref Range Status   SARS Coronavirus 2 by RT PCR NEGATIVE NEGATIVE Final    Comment: (NOTE) SARS-CoV-2 target nucleic acids are NOT DETECTED.  The SARS-CoV-2 RNA is generally detectable in upper respiratory specimens during the acute phase of infection. The lowest concentration of SARS-CoV-2 viral copies this assay can detect is 138 copies/mL. A negative result does not preclude SARS-Cov-2 infection and should not be used as the sole basis for treatment or other patient management decisions. A negative result may occur with  improper specimen collection/handling, submission of specimen other than nasopharyngeal swab, presence of viral mutation(s) within the areas targeted by this assay, and inadequate number of viral copies(<138 copies/mL). A negative result must be combined with clinical observations, patient history, and epidemiological information. The expected result is Negative.  Fact Sheet for Patients:  EntrepreneurPulse.com.au  Fact Sheet for Healthcare Providers:  IncredibleEmployment.be  This test is no t yet approved or cleared by the [place] FDA and  has been authorized for detection and/or diagnosis of SARS-CoV-2 by FDA under an Emergency Use Authorization (EUA). This EUA will remain  in effect (meaning this test can be used) for the duration of the COVID-19 declaration under Section 564(b)(1) of the Act,  21 U.S.C.section 360bbb-3(b)(1), unless the authorization is terminated  or revoked sooner.       Influenza A by PCR NEGATIVE NEGATIVE Final   Influenza B by PCR NEGATIVE NEGATIVE Final    Comment: (NOTE) The Xpert Xpress SARS-CoV-2/FLU/RSV plus assay is intended as an aid in the diagnosis of influenza from Nasopharyngeal swab specimens and should not be used as a sole basis for treatment. Nasal washings and aspirates are unacceptable for Xpert Xpress SARS-CoV-2/FLU/RSV testing.  Fact Sheet for Patients: EntrepreneurPulse.com.au  Fact Sheet for Healthcare Providers: IncredibleEmployment.be  This test is not yet approved or cleared by the [place] FDA and has been authorized for detection and/or diagnosis of SARS-CoV-2 by FDA under an Emergency Use Authorization (EUA). This EUA will remain in effect (meaning this test can be used) for the duration of the COVID-19 declaration under Section 564(b)(1) of the Act, 21 U.S.C. section 360bbb-3(b)(1), unless the authorization is terminated or revoked.  Performed at [facility], [address] [place], [place] [address]     Procedures and diagnostic studies:  CT Angio Chest PE W and/or Wo Contrast  Result Date: 06/04/2021 CLINICAL DATA:  Epigastric pain.  Pancreatic malignancy. EXAM: CT ANGIOGRAPHY CHEST CT ABDOMEN AND PELVIS WITH CONTRAST TECHNIQUE: Multidetector CT imaging of the chest was performed using the standard protocol during bolus administration of intravenous contrast. Multiplanar CT image reconstructions and MIPs were obtained to evaluate the vascular anatomy. Multidetector CT imaging of the abdomen and pelvis was performed using the standard protocol during bolus administration of intravenous contrast. CONTRAST:  74mL OMNIPAQUE IOHEXOL 350 MG/ML SOLN COMPARISON:  CT renal stone 05/21/2021. FINDINGS: CTA CHEST FINDINGS Cardiovascular: The heart and aorta are normal in size. There are  atherosclerotic calcifications of the aorta. There is no pericardial effusion. There is adequate opacification of the pulmonary arteries to the segmental level. There are segmental and subsegmental right lower lobe pulmonary emboli. There are subsegmental right middle lobe pulmonary emboli. Mediastinum/Nodes: There is a moderate hiatal hernia. The esophagus is nondilated. There are no enlarged mediastinal or hilar lymph nodes. Lungs/Pleura: There is a trace right pleural effusion. There is bilateral lower lung atelectasis.  There is a small amount of patchy airspace and ground-glass opacity in the right lower lobe. Trachea and central airways are patent. No pneumothorax. No suspicious pulmonary nodules. Musculoskeletal: No chest wall abnormality. No acute or significant osseous findings. There is moderate compression fracture of T10 which is new from the prior examination. No axillary lymphadenopathy. Review of the MIP images confirms the above findings. CT ABDOMEN and PELVIS FINDINGS Hepatobiliary: There is a 15 mm hypodense lesion in the left lobe of the liver image 2/19. There is a similar appearing hypodense lesion measuring 2 cm in the left lobe of the liver image 2/32. Small left hepatic cyst in the left lobe measures 9 mm. No gallstones are identified. Gallbladder sludge is likely present. No definite biliary ductal dilatation. Pancreas: Lobulated low-density mass in the region of the body and tail the pancreas measures 5.8 x 11.4 x 5.5 cm. Low-attenuation lesion in the head of the pancreas versus dilated duct seen on image 2/26 measuring 15 mm. There is loss of fat plane between the lesser curvature of the stomach. This mass encompasses the celiac axis and its proximal branches, possibly causing occlusion. Superior mesenteric vein not definitely involved. 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 Splenic vein not visualized and may be  occluded as well. Spleen: There is small wedge-shaped area of hypodensity in the spleen suspicious for infarct. No perisplenic fluid collection identified. The spleen is nonenlarged. Adrenals/Urinary Tract: The bilateral adrenal glands are within normal limits. There are punctate nonobstructing right renal calculi. There is no hydronephrosis in either kidney. There is a 5.2 by 3.8 cm cyst in the right kidney. There is an additional cortical hypodensity in the right kidney which is too small to characterize image 2/23. This likely also represents cyst. Bladder is grossly within normal limits, but evaluation is limited secondary to streak artifact in the pelvis. Stomach/Bowel: No evidence for bowel obstruction, free air or pneumatosis. There is a large amount of stool throughout the colon. The appendix is not seen. Small bowel loops are within normal limits. Pancreatic mass abuts the body of the stomach with loss of fat plane. The stomach is otherwise normal. There is a moderate-sized hiatal hernia. Vascular/Lymphatic: Aorta and IVC are normal in size. There are atherosclerotic calcifications of the aorta. No discrete enlarged lymph nodes are identified. Reproductive: Uterus and bilateral adnexa are unremarkable. Other: There is a small amount of free fluid in the pelvis. There is a small fat containing umbilical hernia. Musculoskeletal: Degenerative changes affect the spine. No acute fractures are seen. No focal osseous lesions are seen. Right hip arthroplasty is present. Review of the MIP images confirms the above findings. IMPRESSION: 1. Segmental and subsegmental right lower lobe pulmonary emboli. Subsegmental right middle lobe pulmonary emboli. (RV/LV Ratio = 1.0) 2. Trace right pleural effusion. 3. Minimal patchy airspace disease and ground-glass opacities in the right lower lobe suspicious for infection. 4. T10 compression fracture is new from May 21, 2021. 5. Large mass involving the pancreatic body and  tail most compatible with primary pancreatic neoplasm. Questionable ductal dilatation in the head of the pancreas versus second smaller lesion. 6. 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 7. 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 8. Likely small splenic infarct. 9. There are 2 suspicious lesions in the left lobe of the liver, likely metastatic disease. 10. Nonobstructing right renal  calculi. 11. Small amount of free fluid in the pelvis. 12. Large stool burden. 13.  Aortic Atherosclerosis (ICD10-I70.0). These results were called by telephone at the time of interpretation on 06/04/2021 at 7:28 pm to provider Wynona Dove , who verbally acknowledged these results. Electronically Signed   By: Ronney Asters M.D.   On: 06/04/2021 19:32   CT ABDOMEN PELVIS W CONTRAST  Result Date: 06/04/2021 CLINICAL DATA:  Epigastric pain.  Pancreatic malignancy. EXAM: CT ANGIOGRAPHY CHEST CT ABDOMEN AND PELVIS WITH CONTRAST TECHNIQUE: Multidetector CT imaging of the chest was performed using the standard protocol during bolus administration of intravenous contrast. Multiplanar CT image reconstructions and MIPs were obtained to evaluate the vascular anatomy. Multidetector CT imaging of the abdomen and pelvis was performed using the standard protocol during bolus administration of intravenous contrast. CONTRAST:  27mL OMNIPAQUE IOHEXOL 350 MG/ML SOLN COMPARISON:  CT renal stone 05/21/2021. FINDINGS: CTA CHEST FINDINGS Cardiovascular: The heart and aorta are normal in size. There are atherosclerotic calcifications of the aorta. There is no pericardial effusion. There is adequate opacification of the pulmonary arteries to the segmental level. There are segmental and subsegmental right lower lobe pulmonary emboli. There are subsegmental right middle lobe pulmonary emboli. Mediastinum/Nodes: There is a moderate hiatal hernia. The  esophagus is nondilated. There are no enlarged mediastinal or hilar lymph nodes. Lungs/Pleura: There is a trace right pleural effusion. There is bilateral lower lung atelectasis. There is a small amount of patchy airspace and ground-glass opacity in the right lower lobe. Trachea and central airways are patent. No pneumothorax. No suspicious pulmonary nodules. Musculoskeletal: No chest wall abnormality. No acute or significant osseous findings. There is moderate compression fracture of T10 which is new from the prior examination. No axillary lymphadenopathy. Review of the MIP images confirms the above findings. CT ABDOMEN and PELVIS FINDINGS Hepatobiliary: There is a 15 mm hypodense lesion in the left lobe of the liver image 2/19. There is a similar appearing hypodense lesion measuring 2 cm in the left lobe of the liver image 2/32. Small left hepatic cyst in the left lobe measures 9 mm. No gallstones are identified. Gallbladder sludge is likely present. No definite biliary ductal dilatation. Pancreas: Lobulated low-density mass in the region of the body and tail the pancreas measures 5.8 x 11.4 x 5.5 cm. Low-attenuation lesion in the head of the pancreas versus dilated duct seen on image 2/26 measuring 15 mm. There is loss of fat plane between the lesser curvature of the stomach. This mass encompasses the celiac axis and its proximal branches, possibly causing occlusion. Superior mesenteric vein not definitely involved. 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 Splenic vein not visualized and may be occluded as well. Spleen: There is small wedge-shaped area of hypodensity in the spleen suspicious for infarct. No perisplenic fluid collection identified. The spleen is nonenlarged. Adrenals/Urinary Tract: The bilateral adrenal glands are within normal limits. There are punctate nonobstructing right renal calculi. There is no hydronephrosis in either  kidney. There is a 5.2 by 3.8 cm cyst in the right kidney. There is an additional cortical hypodensity in the right kidney which is too small to characterize image 2/23. This likely also represents cyst. Bladder is grossly within normal limits, but evaluation is limited secondary to streak artifact in the pelvis. Stomach/Bowel: No evidence for bowel obstruction, free air or pneumatosis. There is a large amount of stool throughout the colon. The appendix is not seen. Small  bowel loops are within normal limits. Pancreatic mass abuts the body of the stomach with loss of fat plane. The stomach is otherwise normal. There is a moderate-sized hiatal hernia. Vascular/Lymphatic: Aorta and IVC are normal in size. There are atherosclerotic calcifications of the aorta. No discrete enlarged lymph nodes are identified. Reproductive: Uterus and bilateral adnexa are unremarkable. Other: There is a small amount of free fluid in the pelvis. There is a small fat containing umbilical hernia. Musculoskeletal: Degenerative changes affect the spine. No acute fractures are seen. No focal osseous lesions are seen. Right hip arthroplasty is present. Review of the MIP images confirms the above findings. IMPRESSION: 1. Segmental and subsegmental right lower lobe pulmonary emboli. Subsegmental right middle lobe pulmonary emboli. (RV/LV Ratio = 1.0) 2. Trace right pleural effusion. 3. Minimal patchy airspace disease and ground-glass opacities in the right lower lobe suspicious for infection. 4. T10 compression fracture is new from May 21, 2021. 5. Large mass involving the pancreatic body and tail most compatible with primary pancreatic neoplasm. Questionable ductal dilatation in the head of the pancreas versus second smaller lesion. 6. 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 7. 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 8. Likely small  splenic infarct. 9. There are 2 suspicious lesions in the left lobe of the liver, likely metastatic disease. 10. Nonobstructing right renal calculi. 11. Small amount of free fluid in the pelvis. 12. Large stool burden. 13.  Aortic Atherosclerosis (ICD10-I70.0). These results were called by telephone at the time of interpretation on 06/04/2021 at 7:28 pm to provider Wynona Dove , who verbally acknowledged these results. Electronically Signed   By: Ronney Asters M.D.   On: 06/04/2021 19:32   CT T-SPINE NO CHARGE  Result Date: 06/04/2021 CLINICAL DATA:  Initial evaluation for acute trauma, fall. EXAM: CT Thoracic and Lumbar spine  Contrast TECHNIQUE: Multiplanar CT images of the thoracic and lumbar spine were reconstructed from contemporary CT of the Chest, Abdomen, and Pelvis CONTRAST:  None or No additional COMPARISON:  Comparison made with concomitant CT of the chest, abdomen, and pelvis. FINDINGS: CT THORACIC SPINE FINDINGS Alignment: Exaggeration of the normal thoracic kyphosis. No listhesis. Vertebrae: There is an acute compression fracture involving the T10 vertebral body. Associated height loss measures up to 50% with 3 mm bony retropulsion. No significant stenosis. Otherwise, vertebral body height maintained with no other acute or chronic fracture. Visualized ribs intact. No worrisome osseous lesions. Paraspinal and other soft tissues: Paraspinous soft tissues demonstrate no acute finding. Small layering right pleural effusion with associated atelectasis. Previously identified pancreatic neoplasm partially visualize, better evaluated on concomitant CT of the abdomen and pelvis. Hiatal hernia noted. Scattered aortic atherosclerosis. Acute pulmonary embolism within right lower lobe segmental branches, also better evaluated on concomitant chest CT. Disc levels: No significant disc pathology or stenosis seen within the thoracic spine. CT LUMBAR SPINE FINDINGS Segmentation: Standard. Lowest well-formed disc space  labeled the L5-S1 level. Alignment: Moderate levoscoliosis with apex at L2-3. Alignment otherwise normal preservation of the normal lumbar lordosis. Vertebrae: Vertebral body height maintained without acute or chronic fracture. Visualized sacrum and pelvis intact. No worrisome osseous lesions. Paraspinal and other soft tissues: Paraspinous soft tissues demonstrate no acute finding. 5.4 cm parapelvic cyst noted within the right kidney. Nonobstructive right renal nephrolithiasis noted. Aorto bi-iliac atherosclerotic disease. Disc levels: Mild multilevel degenerative spondylosis, most pronounced at L1-2, L4-5, and L5-S1. Lower lumbar facet hypertrophy. No significant  spinal stenosis. Moderate bilateral L5 foraminal narrowing. IMPRESSION: CT THORACIC SPINE IMPRESSION: 1. Compression fracture involving the T10 vertebral body with up to 50% height loss and 3 mm bony retropulsion, new as compared to 05/21/2021, consistent with an acute/subacute finding. No significant stenosis. 2. No other acute traumatic injury within the thoracic spine. 3. Acute right-sided pulmonary emboli, better evaluated on concomitant CT of the chest. 4. Small layering right pleural effusion with associated atelectasis. CT LUMBAR SPINE IMPRESSION: 1. No acute traumatic injury within the lumbar spine. 2. Moderate levoscoliosis with apex at L2-3. 3. Mild multilevel degenerative spondylosis, most pronounced at L1-2, L4-5, and L5-S1. Moderate bilateral L5 foraminal narrowing. 4. Nonobstructive right renal nephrolithiasis. 5. Aortic Atherosclerosis (ICD10-I70.0). Results regarding the acute pulmonary emboli were communicated to the ordering clinician by Dr. Ronney Asters, documented on corresponding CTA of the chest. Electronically Signed   By: Jeannine Boga M.D.   On: 06/04/2021 19:45   CT L-SPINE NO CHARGE  Result Date: 06/04/2021 CLINICAL DATA:  Initial evaluation for acute trauma, fall. EXAM: CT Thoracic and Lumbar spine  Contrast TECHNIQUE:  Multiplanar CT images of the thoracic and lumbar spine were reconstructed from contemporary CT of the Chest, Abdomen, and Pelvis CONTRAST:  None or No additional COMPARISON:  Comparison made with concomitant CT of the chest, abdomen, and pelvis. FINDINGS: CT THORACIC SPINE FINDINGS Alignment: Exaggeration of the normal thoracic kyphosis. No listhesis. Vertebrae: There is an acute compression fracture involving the T10 vertebral body. Associated height loss measures up to 50% with 3 mm bony retropulsion. No significant stenosis. Otherwise, vertebral body height maintained with no other acute or chronic fracture. Visualized ribs intact. No worrisome osseous lesions. Paraspinal and other soft tissues: Paraspinous soft tissues demonstrate no acute finding. Small layering right pleural effusion with associated atelectasis. Previously identified pancreatic neoplasm partially visualize, better evaluated on concomitant CT of the abdomen and pelvis. Hiatal hernia noted. Scattered aortic atherosclerosis. Acute pulmonary embolism within right lower lobe segmental branches, also better evaluated on concomitant chest CT. Disc levels: No significant disc pathology or stenosis seen within the thoracic spine. CT LUMBAR SPINE FINDINGS Segmentation: Standard. Lowest well-formed disc space labeled the L5-S1 level. Alignment: Moderate levoscoliosis with apex at L2-3. Alignment otherwise normal preservation of the normal lumbar lordosis. Vertebrae: Vertebral body height maintained without acute or chronic fracture. Visualized sacrum and pelvis intact. No worrisome osseous lesions. Paraspinal and other soft tissues: Paraspinous soft tissues demonstrate no acute finding. 5.4 cm parapelvic cyst noted within the right kidney. Nonobstructive right renal nephrolithiasis noted. Aorto bi-iliac atherosclerotic disease. Disc levels: Mild multilevel degenerative spondylosis, most pronounced at L1-2, L4-5, and L5-S1. Lower lumbar facet hypertrophy.  No significant spinal stenosis. Moderate bilateral L5 foraminal narrowing. IMPRESSION: CT THORACIC SPINE IMPRESSION: 1. Compression fracture involving the T10 vertebral body with up to 50% height loss and 3 mm bony retropulsion, new as compared to 05/21/2021, consistent with an acute/subacute finding. No significant stenosis. 2. No other acute traumatic injury within the thoracic spine. 3. Acute right-sided pulmonary emboli, better evaluated on concomitant CT of the chest. 4. Small layering right pleural effusion with associated atelectasis. CT LUMBAR SPINE IMPRESSION: 1. No acute traumatic injury within the lumbar spine. 2. Moderate levoscoliosis with apex at L2-3. 3. Mild multilevel degenerative spondylosis, most pronounced at L1-2, L4-5, and L5-S1. Moderate bilateral L5 foraminal narrowing. 4. Nonobstructive right renal nephrolithiasis. 5. Aortic Atherosclerosis (ICD10-I70.0). Results regarding the acute pulmonary emboli were communicated to the ordering clinician by Dr. Ronney Asters, documented on corresponding CTA of  the chest. Electronically Signed   By: Jeannine Boga M.D.   On: 06/04/2021 19:45   DG Chest Port 1 View  Result Date: 06/04/2021 CLINICAL DATA:  Shortness of breath.  Back pain. EXAM: PORTABLE CHEST 1 VIEW COMPARISON:  Two-view chest x-ray 01/22/2016 FINDINGS: Heart size upper limits of normal, exaggerated by low lung volumes. Atherosclerotic changes are present in the arch. Mild atelectasis or scarring is present both bases. No edema or effusion is present airspace consolidation is present. Bony thorax is within normal limits. IMPRESSION: 1. Low lung volumes. 2. Mild bibasilar atelectasis or scarring. 3. Atherosclerosis. Electronically Signed   By: San Morelle M.D.   On: 06/04/2021 17:44   ECHOCARDIOGRAM COMPLETE  Result Date: 06/05/2021    ECHOCARDIOGRAM REPORT   Patient Name:   SHALIMAR MCCLAIN Date of Exam: 06/05/2021 Medical Rec #:  [id number]         Height:       62.0 in  Accession #:    [id number]        Weight:       137.6 lb Date of Birth:  April 04, 1941         BSA:          1.631 m Patient Age:    59 years          BP:           136/52 mmHg Patient Gender: F                 HR:           88 bpm. Exam Location:  [facility] Procedure: 2D Echo, Cardiac Doppler and Color Doppler Indications:    Pulmonary Embolus  History:        Patient has prior history of Echocardiogram examinations, most                 recent 10/15/2012. TIA, Arrythmias:Bradycardia; Risk                 Factors:Hypertension, Dyslipidemia and Former Smoker.  Sonographer:    Wenda Low Referring Phys: [id number] OLADAPO ADEFESO IMPRESSIONS  1. Left ventricular ejection fraction, by estimation, is 60 to 65%. The left ventricle has normal function. The left ventricle has no regional wall motion abnormalities. Left ventricular diastolic parameters are consistent with Grade I diastolic dysfunction (impaired relaxation).  2. Right ventricular systolic function is normal. The right ventricular size is normal. Tricuspid regurgitation signal is inadequate for assessing PA pressure.  3. Left atrial size was mildly dilated.  4. The mitral valve is normal in structure. No evidence of mitral valve regurgitation. No evidence of mitral stenosis.  5. The aortic valve is tricuspid. Aortic valve regurgitation is trivial. No aortic stenosis is present. FINDINGS  Left Ventricle: Left ventricular ejection fraction, by estimation, is 60 to 65%. The left ventricle has normal function. The left ventricle has no regional wall motion abnormalities. The left ventricular internal cavity size was normal in size. There is  no left ventricular hypertrophy. Left ventricular diastolic parameters are consistent with Grade I diastolic dysfunction (impaired relaxation). Normal left ventricular filling pressure. Right Ventricle: The right ventricular size is normal. No increase in right ventricular wall thickness. Right ventricular systolic function  is normal. Tricuspid regurgitation signal is inadequate for assessing PA pressure. Left Atrium: Left atrial size was mildly dilated. Right Atrium: Right atrial size was normal in size. Pericardium: There is no evidence of pericardial effusion. Mitral Valve: The mitral valve is normal  in structure. No evidence of mitral valve regurgitation. No evidence of mitral valve stenosis. MV peak gradient, 4.5 mmHg. The mean mitral valve gradient is 2.0 mmHg. Tricuspid Valve: The tricuspid valve is normal in structure. Tricuspid valve regurgitation is trivial. No evidence of tricuspid stenosis. Aortic Valve: The aortic valve is tricuspid. Aortic valve regurgitation is trivial. No aortic stenosis is present. Aortic valve mean gradient measures 4.0 mmHg. Aortic valve peak gradient measures 10.6 mmHg. Aortic valve area, by VTI measures 2.10 cm. Pulmonic Valve: The pulmonic valve was not well visualized. Pulmonic valve regurgitation is not visualized. No evidence of pulmonic stenosis. Aorta: The aortic root is normal in size and structure. Venous: The inferior vena cava was not well visualized. IAS/Shunts: The interatrial septum was not well visualized.  LEFT VENTRICLE PLAX 2D LVIDd:         4.40 cm     Diastology LVIDs:         2.90 cm     LV e' medial:    7.40 cm/s LV PW:         1.00 cm     LV E/e' medial:  9.7 LV IVS:        1.00 cm     LV e' lateral:   5.66 cm/s LVOT diam:     1.90 cm     LV E/e' lateral: 12.7 LV SV:         64 LV SV Index:   39 LVOT Area:     2.84 cm  LV Volumes (MOD) LV vol d, MOD A2C: 40.0 ml LV vol d, MOD A4C: 33.3 ml LV vol s, MOD A2C: 17.2 ml LV vol s, MOD A4C: 14.1 ml LV SV MOD A2C:     22.8 ml LV SV MOD A4C:     33.3 ml LV SV MOD BP:      21.6 ml RIGHT VENTRICLE RV Basal diam:  3.25 cm RV Mid diam:    2.90 cm RV S prime:     14.10 cm/s TAPSE (M-mode): 3.7 cm LEFT ATRIUM             Index        RIGHT ATRIUM           Index LA diam:        4.10 cm 2.51 cm/m   RA Area:     11.90 cm LA Vol (A2C):    60.8 ml 37.29 ml/m  RA Volume:   27.50 ml  16.86 ml/m LA Vol (A4C):   57.4 ml 35.20 ml/m LA Biplane Vol: 59.4 ml 36.43 ml/m  AORTIC VALVE                    PULMONIC VALVE AV Area (Vmax):    2.10 cm     PV Vmax:       1.14 m/s AV Area (Vmean):   2.06 cm     PV Peak grad:  5.2 mmHg AV Area (VTI):     2.10 cm AV Vmax:           163.00 cm/s AV Vmean:          92.700 cm/s AV VTI:            0.306 m AV Peak Grad:      10.6 mmHg AV Mean Grad:      4.0 mmHg LVOT Vmax:         121.00 cm/s LVOT Vmean:  67.400 cm/s LVOT VTI:          0.227 m LVOT/AV VTI ratio: 0.74  AORTA Ao Root diam: 3.00 cm MITRAL VALVE MV Area (PHT): 3.33 cm    SHUNTS MV Area VTI:   2.43 cm    Systemic VTI:  0.23 m MV Peak grad:  4.5 mmHg    Systemic Diam: 1.90 cm MV Mean grad:  2.0 mmHg MV Vmax:       1.06 m/s MV Vmean:      56.4 cm/s MV Decel Time: 228 msec MV E velocity: 71.60 cm/s MV A velocity: 97.70 cm/s MV E/A ratio:  0.73 Carlyle Dolly MD Electronically signed by Carlyle Dolly MD Signature Date/Time: 06/05/2021/3:20:32 PM    Final                LOS: 2 days   Domnique Vanegas  [facility]   Pager on [email or web address]. If 7PM-7AM, please contact night-coverage at [email or web address]     06/06/2021, 4:10 PM

## 2021-06-06 NOTE — Evaluation (Addendum)
Physical Therapy Evaluation Patient Details Name: Andrea Moreno MRN: [id number] DOB: May 10, 1941 Today's Date: 06/06/2021  History of Present Illness  Andrea Moreno is a 81 y.o. female with medical history significant for  Pancreatic cancer (not yet on chemotherapy), essential hypertension, GERD who presents to the emergency department via EMS with complaint of 3 months of worsening chronic back pain with minimal to no relief on oral analgesics.  Back pain rapidly worsened within the past week, pain was midthoracic with radiation to the ribs (R > L).  This was associated with a decreased activity within past few weeks due to intensity of the back pain.  EMS was activated due to shortness of breath, on arrival of EMS team, patient was noted to be hypoxic in the 80s (patient does not use oxygen at baseline).  Supplemental oxygen via [place] at 2 LPM was provided with improvement in O2 sats, though she still complains of pleuritic chest pain which worsens with breathing and also complains of few days of constipation, but denies fever, chills, chest congestion, cough, numbness, tingling, urine/bowel incontinence.  Patient has a pending schedule to see hematology/oncology on Thursday (1/4)   Clinical Impression  Patient required Max assist to donn TLSO brace, demonstrates slow labored movement for sitting up at bedside with Mod assist, very unsteady on feet and limited to a few slow labored side steps before having to sit due to fatigue and low back pain.  Patient tolerated sitting up in chair after therapy - nursing staff notified.  Patient will benefit from continued skilled physical therapy in hospital and recommended venue below to increase strength, balance, endurance for safe ADLs and gait.         Recommendations for follow up therapy are one component of a multi-disciplinary discharge planning process, led by the attending physician.  Recommendations may be updated based on patient status, additional  functional criteria and insurance authorization.  Follow Up Recommendations Skilled nursing-short term rehab (<3 hours/day)    Assistance Recommended at Discharge Frequent or constant Supervision/Assistance  Patient can return home with the following  A lot of help with walking and/or transfers;A lot of help with bathing/dressing/bathroom;Assistance with cooking/housework    Equipment Recommendations None recommended by PT  Recommendations for Other Services       Functional Status Assessment Patient has had a recent decline in their functional status and demonstrates the ability to make significant improvements in function in a reasonable and predictable amount of time.     Precautions / Restrictions Precautions Precautions: Fall Precaution Comments: TLSO brace Required Braces or Orthoses: Spinal Brace Spinal Brace: Thoracolumbosacral orthotic Other Brace: TLSO brace Restrictions Weight Bearing Restrictions: No      Mobility  Bed Mobility               General bed mobility comments: as per OT notes    Transfers                   General transfer comment: as per OT notes    Ambulation/Gait Ambulation/Gait assistance: Mod assist Gait Distance (Feet): 5 Feet Assistive device: Rolling walker (2 wheels) Gait Pattern/deviations: Decreased step length - right;Decreased step length - left;Decreased stride length Gait velocity: decreased     General Gait Details: limited to a few slow labored unsteady steps at bedside requring frequent verbal/tactile cueing for safety, limited mosty due to c/o fatigue and back pain  Stairs            Wheelchair Mobility  Modified Rankin (Stroke Patients Only)       Balance Overall balance assessment: Needs assistance Sitting-balance support: Feet supported;No upper extremity supported Sitting balance-Leahy Scale: Fair Sitting balance - Comments: fair to good seated at EOB   Standing balance support:  Bilateral upper extremity supported;During functional activity;Reliant on assistive device for balance Standing balance-Leahy Scale: Poor Standing balance comment: using RW                             Pertinent Vitals/Pain Pain Assessment: 0-10 Pain Score: 10-Worst pain ever Breathing: normal Negative Vocalization: none Facial Expression: smiling or inexpressive Body Language: relaxed Consolability: no need to console PAINAD Score: 0 Pain Location: low back Pain Descriptors / Indicators: Sore;Grimacing;Guarding Pain Intervention(s): Limited activity within patient's tolerance;Monitored during session;Repositioned    Home Living Family/patient expects to be discharged to:: Private residence Living Arrangements: Alone Available Help at Discharge: Personal care attendant;Available PRN/intermittently Type of Home: House Home Access: Stairs to enter Entrance Stairs-Rails: Right;Left;Can reach both Entrance Stairs-Number of Steps: 5   Home Layout: One level Home Equipment: Conservation officer, nature (2 wheels);Cane - single point;Wheelchair - manual Additional Comments: Per pt report; pt may not be relable based on poor orientation.    Prior Function Prior Level of Function : Needs assist       Physical Assist : Mobility (physical) Mobility (physical): Transfers;Gait;Stairs   Mobility Comments: Pt reports cane usage most of the time with household and community ambulation. ADLs Comments: Pt reports independence with ADL's and IADL's but also reported that she has a "helper" that comes in PRN. Information per pt report.     Hand Dominance   Dominant Hand: Right    Extremity/Trunk Assessment   Upper Extremity Assessment Upper Extremity Assessment: Defer to OT evaluation    Lower Extremity Assessment Lower Extremity Assessment: Generalized weakness    Cervical / Trunk Assessment Cervical / Trunk Assessment: Kyphotic  Communication   Communication: No difficulties   Cognition Arousal/Alertness: Awake/alert Behavior During Therapy: WFL for tasks assessed/performed Overall Cognitive Status: No family/caregiver present to determine baseline cognitive functioning                                          General Comments      Exercises     Assessment/Plan    PT Assessment Patient needs continued PT services  PT Problem List Decreased strength;Decreased activity tolerance;Decreased balance;Decreased mobility       PT Treatment Interventions DME instruction;Gait training;Stair training;Functional mobility training;Therapeutic activities;Therapeutic exercise;Patient/family education;Balance training    PT Goals (Current goals can be found in the Care Plan section)  Acute Rehab PT Goals Patient Stated Goal: return home with family to assist PT Goal Formulation: With patient Time For Goal Achievement: 06/20/21 Potential to Achieve Goals: Good    Frequency Min 3X/week     Co-evaluation PT/OT/SLP Co-Evaluation/Treatment: Yes Reason for Co-Treatment: Complexity of the patient's impairments (multi-system involvement);To address functional/ADL transfers PT goals addressed during session: Mobility/safety with mobility;Balance;Proper use of DME         AM-PAC PT "6 Clicks" Mobility  Outcome Measure Help needed turning from your back to your side while in a flat bed without using bedrails?: A Lot Help needed moving from lying on your back to sitting on the side of a flat bed without using bedrails?: A Lot Help needed moving to  and from a bed to a chair (including a wheelchair)?: A Lot Help needed standing up from a chair using your arms (e.g., wheelchair or bedside chair)?: A Lot Help needed to walk in hospital room?: A Lot Help needed climbing 3-5 steps with a railing? : Total 6 Click Score: 11    End of Session Equipment Utilized During Treatment: Gait belt Activity Tolerance: Patient tolerated treatment well;Patient  limited by fatigue;Patient limited by pain Patient left: in chair;with call bell/phone within reach;with chair alarm set Nurse Communication: Mobility status PT Visit Diagnosis: Unsteadiness on feet (R26.81);Other abnormalities of gait and mobility (R26.89);Muscle weakness (generalized) (M62.81)    Time: 8347-5830 PT Time Calculation (min) (ACUTE ONLY): 30 min   Charges:   PT Evaluation $PT Eval Moderate Complexity: 1 Mod PT Treatments $Therapeutic Activity: 23-37 mins        2:10 PM, 06/06/21 Lonell Grandchild, MPT Physical Therapist with [facility] [phone] office [phone] mobile phone

## 2021-06-07 DIAGNOSIS — C259 Malignant neoplasm of pancreas, unspecified: Secondary | ICD-10-CM | POA: Diagnosis not present

## 2021-06-07 DIAGNOSIS — I2699 Other pulmonary embolism without acute cor pulmonale: Secondary | ICD-10-CM | POA: Diagnosis not present

## 2021-06-07 LAB — CBC
HCT: 27 % — ABNORMAL LOW (ref 36.0–46.0)
Hemoglobin: 8.6 g/dL — ABNORMAL LOW (ref 12.0–15.0)
MCH: 27.8 pg (ref 26.0–34.0)
MCHC: 31.9 g/dL (ref 30.0–36.0)
MCV: 87.4 fL (ref 80.0–100.0)
Platelets: 214 10*3/uL (ref 150–400)
RBC: 3.09 MIL/uL — ABNORMAL LOW (ref 3.87–5.11)
RDW: 15.6 % — ABNORMAL HIGH (ref 11.5–15.5)
WBC: 10.8 10*3/uL — ABNORMAL HIGH (ref 4.0–10.5)
nRBC: 0 % (ref 0.0–0.2)

## 2021-06-07 LAB — CANCER ANTIGEN 19-9: CA 19-9: 1240 U/mL — ABNORMAL HIGH (ref 0–35)

## 2021-06-07 NOTE — Progress Notes (Signed)
Patient pretty argumentative in her confusion, have tried to assist her with her tray, but states one thing, does another. Reorientation frequently. Sat at bedside with her for a few minutes, seemed to improve mood slightly. Provided pain medication as ordered.

## 2021-06-07 NOTE — TOC Initial Note (Signed)
Transition of Care [facility]) - Initial/Assessment Note    Patient Details  Name: Andrea Moreno MRN: [id number] Date of Birth: 1940-12-08  [facility] CM/SW Contact:    Boneta Lucks, RN Phone Number: 06/07/2021, 11:30 AM  Clinical Narrative:       TOC consulted to refer to residential hospice, family requested [facility] hospice. TOC sent referral, and checking bed status.          Expected Discharge Plan: [place] Barriers to Discharge: Continued Medical Work up   Patient Goals and CMS Choice Patient states their goals for this hospitalization and ongoing recovery are:: agreeable to residential hospice CMS Medicare.gov Compare Post Acute Care list provided to:: Patient Represenative (must comment) Choice offered to / list presented to : Sibling  Expected Discharge Plan and Services Expected Discharge Plan: [place]   Discharge Planning Services: CM Consult      Prior Living Arrangements/Services     Activities of Daily Living Home Assistive Devices/Equipment: Environmental consultant (specify type) ADL Screening (condition at time of admission) Patient's cognitive ability adequate to safely complete daily activities?: Yes Is the patient deaf or have difficulty hearing?: No Does the patient have difficulty seeing, even when wearing glasses/contacts?: No Does the patient have difficulty concentrating, remembering, or making decisions?: No Patient able to express need for assistance with ADLs?: Yes Does the patient have difficulty dressing or bathing?: Yes Independently performs ADLs?: No Communication: Independent Dressing (OT): Needs assistance Is this a change from baseline?: Change from baseline, expected to last >3 days Grooming: Needs assistance Is this a change from baseline?: Change from baseline, expected to last >3 days Feeding: Independent Bathing: Needs assistance Is this a change from baseline?: Change from baseline, expected to  last >3 days Toileting: Needs assistance Is this a change from baseline?: Change from baseline, expected to last >3days In/Out Bed: Needs assistance Is this a change from baseline?: Change from baseline, expected to last >3 days Walks in Home: Needs assistance Is this a change from baseline?: Change from baseline, expected to last >3 days Does the patient have difficulty walking or climbing stairs?: Yes Weakness of Legs: Both Weakness of Arms/Hands: None  Permission Sought/Granted     Emotional Assessment      Alcohol / Substance Use: Not Applicable Psych Involvement: No (comment)  Admission diagnosis:  NSTEMI (non-ST elevated myocardial infarction) ([place]) [I21.4] Acute pulmonary embolism ([place]) [I26.99] Multiple subsegmental pulmonary emboli without acute cor pulmonale ([place]) [I26.94] Patient Active Problem List   Diagnosis Date Noted   Compression fracture of T10 vertebra ([place])    Pancreatic mass    Pulmonary embolism ([place]) 06/04/2021   Spondylosis of lumbar spine 06/04/2021   Pleural effusion on right 06/04/2021   Atelectasis 06/04/2021   Pancreatic cancer ([place]) 06/04/2021   Portal vein thrombosis 06/04/2021   Liver lesion, left lobe 06/04/2021   Splenic infarct 06/04/2021   Acute respiratory failure with hypoxia ([place]) 06/04/2021   Elevated d-dimer 06/04/2021   Hypercalcemia 06/04/2021   Elevated troponin 06/04/2021   Elevated brain natriuretic peptide (BNP) level 06/04/2021   Hyperglycemia 06/04/2021   Acute pulmonary embolism ([place]) 06/04/2021   Chronic back pain 08/12/2018   Overweight (BMI 25.0-29.9) 07/21/2016   Insomnia 07/21/2016   Primary osteoarthritis of right hip 01/28/2016   Aortic atherosclerosis ([place]) 01/22/2016   Osteoarthritis 07/20/2015   GAD (generalized anxiety disorder) 01/16/2015   Vitamin D deficiency 01/16/2014   Osteopenia 01/21/2013   History of TIA (transient ischemic attack) 10/14/2012  Hyperlipidemia 10/14/2012   Essential hypertension  10/14/2012   Leukopenia 10/14/2012   PCP:  Sharion Balloon, FNP Pharmacy:

## 2021-06-07 NOTE — Progress Notes (Addendum)
Progress Note    Andrea Moreno  VZD:[id number] DOB: 24-Jan-1941  DOA: 06/04/2021 PCP: Sharion Balloon, FNP      Brief Narrative:    Medical records reviewed and are as summarized below:  Andrea Moreno is a 81 y.o. female with medical history significant for pancreatic cancer, hypertension, GERD, who presented to the hospital with back pain and shortness of breath.  She has had chronic back pain for about 3 months now and has had no relief with analgesics at home.  However, she noticed significant worsening of her back pain for about 1 week prior to admission.      Assessment/Plan:   Principal Problem:   Pulmonary embolism (HCC) Active Problems:   Essential hypertension   Chronic back pain   Spondylosis of lumbar spine   Pleural effusion on right   Atelectasis   Pancreatic cancer (HCC)   Portal vein thrombosis   Liver lesion, left lobe   Splenic infarct   Acute respiratory failure with hypoxia (HCC)   Elevated d-dimer   Hypercalcemia   Elevated troponin   Elevated brain natriuretic peptide (BNP) level   Hyperglycemia   Acute pulmonary embolism (HCC)   Compression fracture of T10 vertebra (HCC)   Pancreatic mass   Body mass index is 26.25 kg/m.  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  Acute on chronic back pain, T10 compression fracture, lumbar spine spondylosis: Continue with TLSO brace: Continue analgesics as needed for pain.  Appreciate input from Dr. Aline Brochure, orthopedic surgeon.  Hypercalcemia: This is likely from malignancy.  S/p treatment with zoledronic acid on 06/06/2020.   Poor oral intake: Continue low rate IV fluids because of poor oral intake  Pancreatic cancer, hepatic lesions suspicious for metastatic disease:  Appreciate input from oncologist.  Family is requesting discharge to hospice home.  Follow-up with social worker and hospice team to assist with disposition.  Mildly elevated troponins: This is probably from acute pulmonary embolism.  2D echo showed EF estimated at 60 to 29%, grade 1 diastolic dysfunction.  PT and OT recommended discharge to SNF.  Follow-up with social worker to assist with disposition.  Plan discussed with Pamala Hurry, sister, over the phone.  Goals of care were discussed.  She said that the patient had indicated previously that she wanted to be DNR because she was "dying anyway" and wanted hospice care.  CODE STATUS has been changed from full code to DNR in EMR.   Diet Order             Diet Heart Room service appropriate? Yes; Fluid consistency: Thin  Diet effective now                      Consultants: Orthopedic surgeon  Procedures: None    Medications:    apixaban  10 mg Oral BID   Followed by   Derrill Memo ON 06/13/2021] apixaban  5 mg Oral BID   aspirin  325 mg Oral Daily   Continuous Infusions:  dextrose 5 % and 0.9 % NaCl with KCl 20 mEq/L 75 mL/hr at 06/06/21 1818   methocarbamol (ROBAXIN) IV       Anti-infectives (From admission, onward)    None              Family Communication/Anticipated D/C date and plan/Code  Status   DVT prophylaxis: SCDs Start: 06/04/21 2355 apixaban (ELIQUIS) tablet 10 mg  apixaban (ELIQUIS) tablet 5 mg     Code Status: Full Code  Family Communication: Plan discussed with Pamala Hurry, sister Disposition Plan: Plan to discharge home in 1 to 2 days   Status is: Inpatient  Remains inpatient appropriate because: Will need placement to SNF           Subjective:   Interval events noted.  She has been intermittently confused.  She still has back pain.  Objective:    Vitals:   06/07/21 0355 06/07/21 0500 06/07/21 1300 06/07/21 1401  BP: (!) 173/67   (!) 129/56  Pulse: (!) 108   74  Resp: 15    18  Temp: 99.1 F (37.3 C)   97.6 F (36.4 C)  TempSrc:    Oral  SpO2: 99%  98% 97%  Weight:  65.1 kg    Height:       No data found.   Intake/Output Summary (Last 24 hours) at 06/07/2021 1530 Last data filed at 06/07/2021 1300 Gross per 24 hour  Intake 241.68 ml  Output 900 ml  Net -658.32 ml   Filed Weights   06/05/21 0538 06/06/21 0500 06/07/21 0500  Weight: 62.4 kg 64.9 kg 65.1 kg    Exam:  GEN: NAD, TLSO brace in place SKIN: Warm and dry EYES: No pallor or icterus ENT: MMM CV: RRR PULM: CTA B ABD: soft, ND, NT, +BS CNS: Drowsy but arousable.  Confused EXT: No edema or tenderness        Data Reviewed:   I have personally reviewed following labs and imaging studies:  Labs: Labs show the following:   Basic Metabolic Panel: Recent Labs  Lab 06/04/21 1645 06/05/21 0330 06/06/21 0541  NA 134* 136 135  K 4.2 3.9 3.8  CL 103 107 105  CO2 22 23 21*  GLUCOSE 106* 115* 97  BUN 25* 21 14  CREATININE 0.89 0.81 0.76  CALCIUM 12.6* 12.1* 12.1*  MG  --  1.7  --   PHOS  --  2.1*  --    GFR Estimated Creatinine Clearance: 49.7 mL/min (by C-G formula based on SCr of 0.76 mg/dL). Liver Function Tests: Recent Labs  Lab 06/04/21 1645 06/05/21 0330  AST 42* 32  ALT 46* 39  ALKPHOS 58 53  BILITOT 0.9 0.6  PROT 6.7 5.6*  ALBUMIN 3.3* 2.8*   No results for input(s): LIPASE, AMYLASE in the last 168 hours. No results for input(s): AMMONIA in the last 168 hours. Coagulation profile No results for input(s): INR, PROTIME in the last 168 hours.  CBC: Recent Labs  Lab 06/04/21 1645 06/05/21 0330 06/06/21 0541 06/07/21 0710  WBC 9.2 6.4 10.9* 10.8*  NEUTROABS 7.0  --   --   --   HGB 9.2* 8.9* 9.2* 8.6*  HCT 28.9* 28.3* 29.2* 27.0*  MCV 87.3 88.4 88.8 87.4  PLT 170 161 188 214   Cardiac Enzymes: No results for input(s): CKTOTAL, CKMB, CKMBINDEX, TROPONINI in the last 168 hours. BNP (last 3 results) No results for input(s): PROBNP in the last 8760  hours. CBG: No results for input(s): GLUCAP in the last 168 hours. D-Dimer: Recent Labs    06/04/21 1645  DDIMER 10.85*   Hgb A1c: No results for input(s): HGBA1C in the last 72 hours. Lipid Profile: No results for input(s): CHOL, HDL, LDLCALC, TRIG, CHOLHDL, LDLDIRECT in the last 72 hours. Thyroid function studies: No results for input(s):  TSH, T4TOTAL, T3FREE, THYROIDAB in the last 72 hours.  Invalid input(s): FREET3 Anemia work up: No results for input(s): VITAMINB12, FOLATE, FERRITIN, TIBC, IRON, RETICCTPCT in the last 72 hours. Sepsis Labs: Recent Labs  Lab 06/04/21 1645 06/05/21 0330 06/06/21 0541 06/07/21 0710  PROCALCITON  --  <0.10  --   --   WBC 9.2 6.4 10.9* 10.8*    Microbiology Recent Results (from the past 240 hour(s))  Resp Panel by RT-PCR (Flu A&B, Covid) Nasopharyngeal Swab     Status: None   Collection Time: 06/04/21  4:57 PM   Specimen: Nasopharyngeal Swab; Nasopharyngeal(NP) swabs in vial transport medium  Result Value Ref Range Status   SARS Coronavirus 2 by RT PCR NEGATIVE NEGATIVE Final    Comment: (NOTE) SARS-CoV-2 target nucleic acids are NOT DETECTED.  The SARS-CoV-2 RNA is generally detectable in upper respiratory specimens during the acute phase of infection. The lowest concentration of SARS-CoV-2 viral copies this assay can detect is 138 copies/mL. A negative result does not preclude SARS-Cov-2 infection and should not be used as the sole basis for treatment or other patient management decisions. A negative result may occur with  improper specimen collection/handling, submission of specimen other than nasopharyngeal swab, presence of viral mutation(s) within the areas targeted by this assay, and inadequate number of viral copies(<138 copies/mL). A negative result must be combined with clinical observations, patient history, and epidemiological information. The expected result is Negative.  Fact Sheet for Patients:   EntrepreneurPulse.com.au  Fact Sheet for Healthcare Providers:  IncredibleEmployment.be  This test is no t yet approved or cleared by the [place] FDA and  has been authorized for detection and/or diagnosis of SARS-CoV-2 by FDA under an Emergency Use Authorization (EUA). This EUA will remain  in effect (meaning this test can be used) for the duration of the COVID-19 declaration under Section 564(b)(1) of the Act, 21 U.S.C.section 360bbb-3(b)(1), unless the authorization is terminated  or revoked sooner.       Influenza A by PCR NEGATIVE NEGATIVE Final   Influenza B by PCR NEGATIVE NEGATIVE Final    Comment: (NOTE) The Xpert Xpress SARS-CoV-2/FLU/RSV plus assay is intended as an aid in the diagnosis of influenza from Nasopharyngeal swab specimens and should not be used as a sole basis for treatment. Nasal washings and aspirates are unacceptable for Xpert Xpress SARS-CoV-2/FLU/RSV testing.  Fact Sheet for Patients: EntrepreneurPulse.com.au  Fact Sheet for Healthcare Providers: IncredibleEmployment.be  This test is not yet approved or cleared by the [place] FDA and has been authorized for detection and/or diagnosis of SARS-CoV-2 by FDA under an Emergency Use Authorization (EUA). This EUA will remain in effect (meaning this test can be used) for the duration of the COVID-19 declaration under Section 564(b)(1) of the Act, 21 U.S.C. section 360bbb-3(b)(1), unless the authorization is terminated or revoked.  Performed at [facility], [address] [place], [place] [address]     Procedures and diagnostic studies:  No results found.             LOS: 3 days   Andrea Moreno  [facility]   Pager on [email or web address]. If 7PM-7AM, please contact night-coverage at [email or web address]     06/07/2021, 3:30 PM

## 2021-06-07 NOTE — TOC Progression Note (Signed)
Transition of Care [facility]) - Progression Note    Patient Details  Name: Andrea Moreno MRN: [id number] Date of Birth: Aug 27, 1940  Transition of Care [facility]) CM/SW Contact  Boneta Lucks, RN Phone Number: 06/07/2021, 4:05 PM  Clinical Narrative:   [name] can not assess until Monday.  TOC called Sister to see if would could refer to [place]. She was agreeable. She states she can not care for her at home. Patient is not eating well. [facility] is questioning code status. MD discussed with sister and made DNR. TOC to follow for hospice recommendation.   Expected Discharge Plan: [place] Barriers to Discharge: Continued Medical Work up  Expected Discharge Plan and Services Expected Discharge Plan: [place]

## 2021-06-07 NOTE — Progress Notes (Addendum)
Manufacturing engineer [facility]) Hospital Liaison Note  Received request from [facility] [place] for family interest in [facility]. Visited patient at bedside and spoke with sister/[place] to confirm interest and explain services.  Approval for [employer] is determined by [facility] MD. Once [facility] MD has determined [facility] eligibility, [facility] will update hospital staff and family.  Addendum: Patient has been approved for [facility]. [place] is unable to offer a room today. Hospital Liaison will follow up tomorrow or sooner if a room becomes available. Please do not hesitate to call with questions  Please do not hesitate to call with any hospice related questions.    Thank you for the opportunity to participate in this patient's care.  Daphene Calamity, MSW [facility] Liaison  [phone]

## 2021-06-07 NOTE — Plan of Care (Signed)
°  Problem: Education: Goal: Knowledge of General Education information will improve Description: Including pain rating scale, medication(s)/side effects and non-pharmacologic comfort measures Outcome: Progressing   Problem: Health Behavior/Discharge Planning: Goal: Ability to manage health-related needs will improve Outcome: Progressing   Problem: Elimination: Goal: Will not experience complications related to bowel motility Outcome: Progressing   Problem: Pain Managment: Goal: General experience of comfort will improve Outcome: Progressing   Problem: Safety: Goal: Ability to remain free from injury will improve Outcome: Progressing

## 2021-06-07 NOTE — Progress Notes (Signed)
Occupational Therapy Treatment Patient Details Name: Andrea Moreno MRN: [id number] DOB: 1940/12/22 Today's Date: 06/07/2021   History of present illness Andrea Moreno is a 81 y.o. female with medical history significant for  Pancreatic cancer (not yet on chemotherapy), essential hypertension, GERD who presents to the emergency department via EMS with complaint of 3 months of worsening chronic back pain with minimal to no relief on oral analgesics.  Back pain rapidly worsened within the past week, pain was midthoracic with radiation to the ribs (R > L).  This was associated with a decreased activity within past few weeks due to intensity of the back pain.  EMS was activated due to shortness of breath, on arrival of EMS team, patient was noted to be hypoxic in the 80s (patient does not use oxygen at baseline).  Supplemental oxygen via [place] at 2 LPM was provided with improvement in O2 sats, though she still complains of pleuritic chest pain which worsens with breathing and also complains of few days of constipation, but denies fever, chills, chest congestion, cough, numbness, tingling, urine/bowel incontinence.  Patient has a pending schedule to see hematology/oncology on Thursday (1/4)   OT comments  Pt lethargic at start of session. Pt required max A to don socks at bed level. Pt attempted but had trouble keeping her eyes open. Pt was able to sit at the EOB with mod A for side lying to sit bed mobility with HOB elevated. Pt unable to functionally ambulate but stood for ~ 1 minute but fatigued needing to return to bed. Pt TLSO adjusted during session. Pt left in bed with call bell within reach and bed alarm set. Nursing present and assisting to scoot pt towards head of bed. Pt will benefit from continued OT in the hospital and recommended venue below to increase strength, balance, and endurance for safe ADL's.      Recommendations for follow up therapy are one component of a multi-disciplinary  discharge planning process, led by the attending physician.  Recommendations may be updated based on patient status, additional functional criteria and insurance authorization.    Follow Up Recommendations  Skilled nursing-short term rehab (<3 hours/day)    Assistance Recommended at Discharge Frequent or constant Supervision/Assistance  Patient can return home with the following  A little help with walking and/or transfers;A lot of help with bathing/dressing/bathroom;Direct supervision/assist for medications management;Direct supervision/assist for financial management;Assist for transportation;Help with stairs or ramp for entrance   Equipment Recommendations  None recommended by OT    Recommendations for Other Services      Precautions / Restrictions Precautions Precautions: Fall Precaution Comments: TLSO brace Restrictions Weight Bearing Restrictions: No       Mobility Bed Mobility Overal bed mobility: Needs Assistance Bed Mobility: Rolling;Sidelying to Sit;Sit to Sidelying Rolling: Mod assist Sidelying to sit: Mod assist     Sit to sidelying: Mod assist;Max assist General bed mobility comments: slow labored movement; lethargy impacting performance most likely    Transfers Overall transfer level: Needs assistance Equipment used: Rolling walker (2 wheels) Transfers: Sit to/from Stand Sit to Stand: Min assist;Mod assist           General transfer comment: Pt required assis to boost from EOB. Pt fatigued after ~ 1 minute of standing with noted tremors.     Balance Overall balance assessment: Needs assistance Sitting-balance support: Feet supported;No upper extremity supported Sitting balance-Leahy Scale: Fair Sitting balance - Comments: fair seated at EOB   Standing balance support: Bilateral upper extremity supported;During  functional activity;Reliant on assistive device for balance Standing balance-Leahy Scale: Poor Standing balance comment: using RW                            ADL either performed or assessed with clinical judgement   ADL Overall ADL's : Needs assistance/impaired                     Lower Body Dressing: Maximal assistance;Bed level Lower Body Dressing Details (indicate cue type and reason): Assist to don socks at bed level; pt attempts but had trouble staying awake.                      Cognition Arousal/Alertness: Lethargic Behavior During Therapy: Flat affect Overall Cognitive Status: No family/caregiver present to determine baseline cognitive functioning                                 General Comments: Pt lethargic having trouble staying awake.                            Pertinent Vitals/ Pain       Pain Assessment: No/denies pain                                                          Frequency  Min 2X/week        Progress Toward Goals  OT Goals(current goals can now be found in the care plan section)  Progress towards OT goals: Progressing toward goals  ADL Goals Pt Will Perform Grooming: with supervision;standing Pt Will Perform Lower Body Bathing: with supervision;sitting/lateral leans Pt Will Perform Upper Body Dressing: with modified independence Pt Will Perform Lower Body Dressing: sitting/lateral leans;with supervision Pt Will Transfer to Toilet: with supervision;stand pivot transfer Pt/caregiver will Perform Home Exercise Program: Increased strength;Both right and left upper extremity;With Supervision;Independently  Plan Discharge plan remains appropriate                                    End of Session Equipment Utilized During Treatment: Rolling walker (2 wheels)  OT Visit Diagnosis: Unsteadiness on feet (R26.81);Other abnormalities of gait and mobility (R26.89);Muscle weakness (generalized) (M62.81);Other symptoms and signs involving cognitive function   Activity Tolerance Patient tolerated  treatment well   Patient Left in bed;with call bell/phone within reach;with bed alarm set   Nurse Communication Other (comment) (Nurse present to assist with bed mobility to scoot pt up in bed.)        Time: 6734-1937 OT Time Calculation (min): 23 min  Charges: OT General Charges $OT Visit: 1 Visit OT Treatments $Self Care/Home Management : 8-22 mins  Andrea Moreno OT, MOT  Andrea Moreno 06/07/2021, 1:42 PM

## 2021-06-08 DIAGNOSIS — I2699 Other pulmonary embolism without acute cor pulmonale: Secondary | ICD-10-CM | POA: Diagnosis not present

## 2021-06-08 DIAGNOSIS — C259 Malignant neoplasm of pancreas, unspecified: Secondary | ICD-10-CM | POA: Diagnosis not present

## 2021-06-08 LAB — CBC
HCT: 26.1 % — ABNORMAL LOW (ref 36.0–46.0)
Hemoglobin: 8.5 g/dL — ABNORMAL LOW (ref 12.0–15.0)
MCH: 28.2 pg (ref 26.0–34.0)
MCHC: 32.6 g/dL (ref 30.0–36.0)
MCV: 86.7 fL (ref 80.0–100.0)
Platelets: 228 10*3/uL (ref 150–400)
RBC: 3.01 MIL/uL — ABNORMAL LOW (ref 3.87–5.11)
RDW: 15.9 % — ABNORMAL HIGH (ref 11.5–15.5)
WBC: 11.2 10*3/uL — ABNORMAL HIGH (ref 4.0–10.5)
nRBC: 0 % (ref 0.0–0.2)

## 2021-06-08 MED ORDER — ENSURE ENLIVE PO LIQD
237.0000 mL | Freq: Two times a day (BID) | ORAL | Status: DC
  Administered 2021-06-08 – 2021-06-09 (×2): 237 mL via ORAL

## 2021-06-08 NOTE — Plan of Care (Signed)
  Problem: Education: Goal: Knowledge of General Education information will improve Description Including pain rating scale, medication(s)/side effects and non-pharmacologic comfort measures Outcome: Progressing   

## 2021-06-08 NOTE — Plan of Care (Signed)
  Problem: Education: Goal: Knowledge of General Education information will improve Description Including pain rating scale, medication(s)/side effects and non-pharmacologic comfort measures Outcome: Progressing   Problem: Health Behavior/Discharge Planning: Goal: Ability to manage health-related needs will improve Outcome: Progressing   

## 2021-06-08 NOTE — Progress Notes (Signed)
Progress Note    Andrea Moreno  BZJ:[id number] DOB: 1940/08/30  DOA: 06/04/2021 PCP: Sharion Balloon, FNP      Brief Narrative:    Medical records reviewed and are as summarized below:  Andrea Moreno is a 81 y.o. female with medical history significant for pancreatic cancer, hypertension, GERD, who presented to the hospital with back pain and shortness of breath.  She has had chronic back pain for about 3 months now and has had no relief with analgesics at home.  However, she noticed significant worsening of her back pain for about 1 week prior to admission.      Assessment/Plan:   Principal Problem:   Pulmonary embolism (HCC) Active Problems:   Essential hypertension   Chronic back pain   Spondylosis of lumbar spine   Pleural effusion on right   Atelectasis   Pancreatic cancer (HCC)   Portal vein thrombosis   Liver lesion, left lobe   Splenic infarct   Acute respiratory failure with hypoxia (HCC)   Elevated d-dimer   Hypercalcemia   Elevated troponin   Elevated brain natriuretic peptide (BNP) level   Hyperglycemia   Acute pulmonary embolism (HCC)   Compression fracture of T10 vertebra (HCC)   Pancreatic mass   Body mass index is 26.53 kg/m.  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  Acute on chronic back pain, T10 compression fracture, lumbar spine spondylosis: Continue with TLSO brace.  Continue analgesics as needed for pain.  Appreciate input from Dr. Aline Brochure, orthopedic surgeon.  Hypercalcemia: This is likely from malignancy.  S/p treatment with zoledronic acid on 06/06/2020.   Poor oral intake: Chart review showed she ate about 50% of her meals this morning.  Discontinue IV fluids.  Encourage adequate oral intake.  Add  Ensure feeding supplement.  Pancreatic cancer, hepatic lesions suspicious for metastatic disease: Appreciate input from oncologist.  Family is requesting discharge to hospice home.  Follow-up with social worker and hospice team to assist with disposition.  Mildly elevated troponins: This is probably from acute pulmonary embolism.  2D echo showed EF estimated at 60 to 01%, grade 1 diastolic dysfunction.  PT and OT recommended discharge to SNF.  Follow-up with social worker to assist with disposition.  Plan of care Dr. Ronne Binning, [facility] Medical Director, requested to speak to me about the patient.  Based on the information I gave him over the phone, he said patient is not eligible for hospice house at  this time.  He requested that I call him again tomorrow to give him an update.    Diet Order             Diet Heart Room service appropriate? Yes; Fluid consistency: Thin  Diet effective now                      Consultants: Orthopedic surgeon  Procedures: None    Medications:    apixaban  10 mg Oral BID   Followed by   Derrill Memo ON 06/13/2021] apixaban  5 mg Oral BID   aspirin  325 mg Oral Daily   Continuous Infusions:  methocarbamol (ROBAXIN) IV       Anti-infectives (From admission, onward)    None              Family Communication/Anticipated D/C  date and plan/Code Status   DVT prophylaxis: SCDs Start: 06/04/21 2355 apixaban (ELIQUIS) tablet 10 mg  apixaban (ELIQUIS) tablet 5 mg     Code Status: DNR  Family Communication: None Disposition Plan: To be determined   Status is: Inpatient  Remains inpatient appropriate because: Placement to SNF versus hospice house           Subjective:   Interval events noted.  She complains of back pain.  She is more communicative today.  Objective:    Vitals:   06/07/21 0500 06/07/21 1300 06/07/21 1401 06/08/21 0450  BP:   (!) 129/56 (!) 162/83  Pulse:   74 (!) 108  Resp:   18 20  Temp:   97.6  F (36.4 C) 97.9 F (36.6 C)  TempSrc:   Oral   SpO2:  98% 97% 98%  Weight: 65.1 kg   65.8 kg  Height:       No data found.   Intake/Output Summary (Last 24 hours) at 06/08/2021 1139 Last data filed at 06/08/2021 1030 Gross per 24 hour  Intake 320 ml  Output 1100 ml  Net -780 ml   Filed Weights   06/06/21 0500 06/07/21 0500 06/08/21 0450  Weight: 64.9 kg 65.1 kg 65.8 kg    Exam:  GEN: NAD SKIN: Warm and dry EYES: No pallor or icterus ENT: MMM CV: RRR PULM: CTA B ABD: soft, ND, NT, +BS CNS: More alert and communicative today.  She is oriented to person and place and she was able to tolerate a time on the clock.  Nonfocal  EXT: No edema or tenderness Psych: She has poor insight.  She is calm and cooperative today.         Data Reviewed:   I have personally reviewed following labs and imaging studies:  Labs: Labs show the following:   Basic Metabolic Panel: Recent Labs  Lab 06/04/21 1645 06/05/21 0330 06/06/21 0541  NA 134* 136 135  K 4.2 3.9 3.8  CL 103 107 105  CO2 22 23 21*  GLUCOSE 106* 115* 97  BUN 25* 21 14  CREATININE 0.89 0.81 0.76  CALCIUM 12.6* 12.1* 12.1*  MG  --  1.7  --   PHOS  --  2.1*  --    GFR Estimated Creatinine Clearance: 49.9 mL/min (by C-G formula based on SCr of 0.76 mg/dL). Liver Function Tests: Recent Labs  Lab 06/04/21 1645 06/05/21 0330  AST 42* 32  ALT 46* 39  ALKPHOS 58 53  BILITOT 0.9 0.6  PROT 6.7 5.6*  ALBUMIN 3.3* 2.8*   No results for input(s): LIPASE, AMYLASE in the last 168 hours. No results for input(s): AMMONIA in the last 168 hours. Coagulation profile No results for input(s): INR, PROTIME in the last 168 hours.  CBC: Recent Labs  Lab 06/04/21 1645 06/05/21 0330 06/06/21 0541 06/07/21 0710 06/08/21 0532  WBC 9.2 6.4 10.9* 10.8* 11.2*  NEUTROABS 7.0  --   --   --   --   HGB 9.2* 8.9* 9.2* 8.6* 8.5*  HCT 28.9* 28.3* 29.2* 27.0* 26.1*  MCV 87.3 88.4 88.8 87.4 86.7  PLT 170 161 188 214 228    Cardiac Enzymes: No results for input(s): CKTOTAL, CKMB, CKMBINDEX, TROPONINI in the last 168 hours. BNP (last 3 results) No results for input(s): PROBNP in the last 8760 hours. CBG: No results for input(s): GLUCAP in the last 168 hours. D-Dimer: No results for input(s): DDIMER in the last 72 hours.  Hgb A1c:  No results for input(s): HGBA1C in the last 72 hours. Lipid Profile: No results for input(s): CHOL, HDL, LDLCALC, TRIG, CHOLHDL, LDLDIRECT in the last 72 hours. Thyroid function studies: No results for input(s): TSH, T4TOTAL, T3FREE, THYROIDAB in the last 72 hours.  Invalid input(s): FREET3 Anemia work up: No results for input(s): VITAMINB12, FOLATE, FERRITIN, TIBC, IRON, RETICCTPCT in the last 72 hours. Sepsis Labs: Recent Labs  Lab 06/05/21 0330 06/06/21 0541 06/07/21 0710 06/08/21 0532  PROCALCITON <0.10  --   --   --   WBC 6.4 10.9* 10.8* 11.2*    Microbiology Recent Results (from the past 240 hour(s))  Resp Panel by RT-PCR (Flu A&B, Covid) Nasopharyngeal Swab     Status: None   Collection Time: 06/04/21  4:57 PM   Specimen: Nasopharyngeal Swab; Nasopharyngeal(NP) swabs in vial transport medium  Result Value Ref Range Status   SARS Coronavirus 2 by RT PCR NEGATIVE NEGATIVE Final    Comment: (NOTE) SARS-CoV-2 target nucleic acids are NOT DETECTED.  The SARS-CoV-2 RNA is generally detectable in upper respiratory specimens during the acute phase of infection. The lowest concentration of SARS-CoV-2 viral copies this assay can detect is 138 copies/mL. A negative result does not preclude SARS-Cov-2 infection and should not be used as the sole basis for treatment or other patient management decisions. A negative result may occur with  improper specimen collection/handling, submission of specimen other than nasopharyngeal swab, presence of viral mutation(s) within the areas targeted by this assay, and inadequate number of viral copies(<138 copies/mL). A negative  result must be combined with clinical observations, patient history, and epidemiological information. The expected result is Negative.  Fact Sheet for Patients:  EntrepreneurPulse.com.au  Fact Sheet for Healthcare Providers:  IncredibleEmployment.be  This test is no t yet approved or cleared by the [place] FDA and  has been authorized for detection and/or diagnosis of SARS-CoV-2 by FDA under an Emergency Use Authorization (EUA). This EUA will remain  in effect (meaning this test can be used) for the duration of the COVID-19 declaration under Section 564(b)(1) of the Act, 21 U.S.C.section 360bbb-3(b)(1), unless the authorization is terminated  or revoked sooner.       Influenza A by PCR NEGATIVE NEGATIVE Final   Influenza B by PCR NEGATIVE NEGATIVE Final    Comment: (NOTE) The Xpert Xpress SARS-CoV-2/FLU/RSV plus assay is intended as an aid in the diagnosis of influenza from Nasopharyngeal swab specimens and should not be used as a sole basis for treatment. Nasal washings and aspirates are unacceptable for Xpert Xpress SARS-CoV-2/FLU/RSV testing.  Fact Sheet for Patients: EntrepreneurPulse.com.au  Fact Sheet for Healthcare Providers: IncredibleEmployment.be  This test is not yet approved or cleared by the [place] FDA and has been authorized for detection and/or diagnosis of SARS-CoV-2 by FDA under an Emergency Use Authorization (EUA). This EUA will remain in effect (meaning this test can be used) for the duration of the COVID-19 declaration under Section 564(b)(1) of the Act, 21 U.S.C. section 360bbb-3(b)(1), unless the authorization is terminated or revoked.  Performed at [facility], [address], [place], [place] [address]     Procedures and diagnostic studies:  No results found.             LOS: 4 days   Qunicy Higinbotham  [facility]   Pager on [email or web address]. If  7PM-7AM, please contact night-coverage at [email or web address]     06/08/2021, 11:39 AM

## 2021-06-08 NOTE — Progress Notes (Signed)
[facility])  Patient has been approved for BP.   [facility] is unable to offer a room today. Hospital Liaison will follow up tomorrow or sooner if a room becomes available. Please do not hesitate to call with questions.  ACC will update hospital and family once a bed opens up.  Venia Carbon RN, BSN [facility] Liaison

## 2021-06-08 NOTE — Plan of Care (Signed)
°  Problem: Education: Goal: Knowledge of General Education information will improve Description: Including pain rating scale, medication(s)/side effects and non-pharmacologic comfort measures 06/08/2021 0104 by Santa Lighter, RN Outcome: Progressing 06/08/2021 0103 by Santa Lighter, RN Outcome: Progressing   Problem: Health Behavior/Discharge Planning: Goal: Ability to manage health-related needs will improve Outcome: Progressing

## 2021-06-09 DIAGNOSIS — I2699 Other pulmonary embolism without acute cor pulmonale: Secondary | ICD-10-CM | POA: Diagnosis not present

## 2021-06-09 DIAGNOSIS — C259 Malignant neoplasm of pancreas, unspecified: Secondary | ICD-10-CM | POA: Diagnosis not present

## 2021-06-09 LAB — CBC
HCT: 25 % — ABNORMAL LOW (ref 36.0–46.0)
Hemoglobin: 8.3 g/dL — ABNORMAL LOW (ref 12.0–15.0)
MCH: 28.5 pg (ref 26.0–34.0)
MCHC: 33.2 g/dL (ref 30.0–36.0)
MCV: 85.9 fL (ref 80.0–100.0)
Platelets: 210 10*3/uL (ref 150–400)
RBC: 2.91 MIL/uL — ABNORMAL LOW (ref 3.87–5.11)
RDW: 15.9 % — ABNORMAL HIGH (ref 11.5–15.5)
WBC: 10 10*3/uL (ref 4.0–10.5)
nRBC: 0 % (ref 0.0–0.2)

## 2021-06-09 NOTE — Progress Notes (Signed)
Manufacturing [employer] [facility])  Ms. Magley remains approved for residential hospice at [facility].  Unfortunately, we do not have a bed today for her.  ACC will continue to follow and update once our bed status changes.  Thank you, Venia Carbon RN, BSN [facility] Liaison

## 2021-06-09 NOTE — TOC Progression Note (Signed)
Transition of Care [facility]) - Progression Note    Patient Details  Name: Andrea Moreno MRN: [id number] Date of Birth: 1940/11/18  Transition of Care [facility]) CM/SW Contact  Kerin Salen, RN Phone Number: 06/09/2021, 11:16 AM  Clinical Narrative:  Per Sonia Baller, hospital liaison, patient is on the wait list for residential hospice bed, will notify TOC when bed is available.     Expected Discharge Plan: [place] Barriers to Discharge: Continued Medical Work up  Expected Discharge Plan and Services Expected Discharge Plan: [place]   Discharge Planning Services: CM Consult                                           Social Determinants of Health (SDOH) Interventions    Readmission Risk Interventions No flowsheet data found.

## 2021-06-09 NOTE — Plan of Care (Signed)
°  Problem: Education: Goal: Knowledge of General Education information will improve Description: Including pain rating scale, medication(s)/side effects and non-pharmacologic comfort measures Outcome: Progressing   Problem: Health Behavior/Discharge Planning: Goal: Ability to manage health-related needs will improve Outcome: Progressing   Problem: Education: Goal: Knowledge of General Education information will improve Description: Including pain rating scale, medication(s)/side effects and non-pharmacologic comfort measures 06/09/2021 2127 by Santa Lighter, RN Outcome: Not Progressing 06/09/2021 2126 by Santa Lighter, RN Outcome: Progressing   Problem: Health Behavior/Discharge Planning: Goal: Ability to manage health-related needs will improve 06/09/2021 2127 by Santa Lighter, RN Outcome: Not Progressing 06/09/2021 2126 by Santa Lighter, RN Outcome: Progressing

## 2021-06-09 NOTE — Progress Notes (Signed)
Progress Note    Andrea Moreno  [id number]:[id number] DOB: [birth year]  DOA: 06/04/2021 PCP: Sharion Balloon, FNP      Brief Narrative:    Medical records reviewed and are as summarized below:  Andrea Moreno is a 81 y.o. female with medical history significant for pancreatic cancer, hypertension, GERD, who presented to the hospital with back pain and shortness of breath.  She has had chronic back pain for about 3 months now and has had no relief with analgesics at home.  However, she noticed significant worsening of her back pain for about 1 week prior to admission.      Assessment/Plan:   Principal Problem:   Pulmonary embolism (HCC) Active Problems:   Essential hypertension   Chronic back pain   Spondylosis of lumbar spine   Pleural effusion on right   Atelectasis   Pancreatic cancer (HCC)   Portal vein thrombosis   Liver lesion, left lobe   Splenic infarct   Acute respiratory failure with hypoxia (HCC)   Elevated d-dimer   Hypercalcemia   Elevated troponin   Elevated brain natriuretic peptide (BNP) level   Hyperglycemia   Acute pulmonary embolism (HCC)   Compression fracture of T10 vertebra (HCC)   Pancreatic mass   Body mass index is 26.85 kg/m.  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  Acute on chronic back pain, T10 compression fracture, lumbar spine spondylosis: Continue analgesics and TLSO brace as needed  Hypercalcemia: This is likely from malignancy.  S/p treatment with zoledronic acid on 06/06/2020.   Poor oral intake: Encouraged adequate oral intake.  Pancreatic cancer, hepatic lesions suspicious for metastatic disease: Appreciate input from oncologist.  Family is requesting discharge to hospice home.  Follow-up with  social worker and hospice team to assist with disposition.  Mildly elevated troponins: This is probably from acute pulmonary embolism.  2D echo showed EF estimated at 60 to 47%, grade 1 diastolic dysfunction.  PT and OT recommended discharge to SNF. Plan to discharge to hospice house   Diet Order             Diet Heart Room service appropriate? Yes; Fluid consistency: Thin  Diet effective now                      Consultants: Orthopedic surgeon Oncologist  Procedures: None    Medications:    apixaban  10 mg Oral BID   Followed by   Derrill Memo ON 06/13/2021] apixaban  5 mg Oral BID   aspirin  325 mg Oral Daily   feeding supplement  237 mL Oral BID BM   Continuous Infusions:  methocarbamol (ROBAXIN) IV       Anti-infectives (From admission, onward)    None              Family Communication/Anticipated D/C date and plan/Code Status   DVT prophylaxis: SCDs Start: 06/04/21 2355 apixaban (ELIQUIS) tablet 10 mg  apixaban (ELIQUIS) tablet 5 mg     Code Status: DNR  Family Communication: Pamala Hurry, sister, on the phone Disposition Plan: Plan to discharge to hospice house tomorrow   Status is: Inpatient  Remains inpatient appropriate because: Awaiting placement to hospice house          Subjective:   Interval events noted.  She is not eating  well.  She said "I think I am dying.  Can you tell my sister to come?"  Objective:    Vitals:   06/08/21 0450 06/08/21 1430 06/08/21 2112 06/09/21 0522  BP: (!) 162/83 (!) 147/61 (!) 152/66 (!) 143/60  Pulse: (!) 108 96 84 84  Resp: 20 18 18 19   Temp: 97.9 F (36.6 C) 98.6 F (37 C) 98.4 F (36.9 C) 98.2 F (36.8 C)  TempSrc:  Oral Oral Oral  SpO2: 98% 98% 97% 95%  Weight: 65.8 kg   66.6 kg  Height:    5\' 2"  (1.575 m)   No data found.   Intake/Output Summary (Last 24 hours) at 06/09/2021 1116 Last data filed at 06/09/2021 0900 Gross per 24 hour  Intake 450 ml  Output --  Net 450 ml    Filed Weights   06/07/21 0500 06/08/21 0450 06/09/21 0522  Weight: 65.1 kg 65.8 kg 66.6 kg    Exam:  GEN: NAD SKIN: Warm and dry EYES: EOMI ENT: MMM CV: RRR PULM: CTA B ABD: soft, ND, NT, +BS CNS: AAO x 2 (person and place), non focal EXT: No edema or tenderness          Data Reviewed:   I have personally reviewed following labs and imaging studies:  Labs: Labs show the following:   Basic Metabolic Panel: Recent Labs  Lab 06/04/21 1645 06/05/21 0330 06/06/21 0541  NA 134* 136 135  K 4.2 3.9 3.8  CL 103 107 105  CO2 22 23 21*  GLUCOSE 106* 115* 97  BUN 25* 21 14  CREATININE 0.89 0.81 0.76  CALCIUM 12.6* 12.1* 12.1*  MG  --  1.7  --   PHOS  --  2.1*  --    GFR Estimated Creatinine Clearance: 50.2 mL/min (by C-G formula based on SCr of 0.76 mg/dL). Liver Function Tests: Recent Labs  Lab 06/04/21 1645 06/05/21 0330  AST 42* 32  ALT 46* 39  ALKPHOS 58 53  BILITOT 0.9 0.6  PROT 6.7 5.6*  ALBUMIN 3.3* 2.8*   No results for input(s): LIPASE, AMYLASE in the last 168 hours. No results for input(s): AMMONIA in the last 168 hours. Coagulation profile No results for input(s): INR, PROTIME in the last 168 hours.  CBC: Recent Labs  Lab 06/04/21 1645 06/05/21 0330 06/06/21 0541 06/07/21 0710 06/08/21 0532 06/09/21 0539  WBC 9.2 6.4 10.9* 10.8* 11.2* 10.0  NEUTROABS 7.0  --   --   --   --   --   HGB 9.2* 8.9* 9.2* 8.6* 8.5* 8.3*  HCT 28.9* 28.3* 29.2* 27.0* 26.1* 25.0*  MCV 87.3 88.4 88.8 87.4 86.7 85.9  PLT 170 161 188 214 228 210   Cardiac Enzymes: No results for input(s): CKTOTAL, CKMB, CKMBINDEX, TROPONINI in the last 168 hours. BNP (last 3 results) No results for input(s): PROBNP in the last 8760 hours. CBG: No results for input(s): GLUCAP in the last 168 hours. D-Dimer: No results for input(s): DDIMER in the last 72 hours.  Hgb A1c: No results for input(s): HGBA1C in the last 72 hours. Lipid Profile: No results for input(s):  CHOL, HDL, LDLCALC, TRIG, CHOLHDL, LDLDIRECT in the last 72 hours. Thyroid function studies: No results for input(s): TSH, T4TOTAL, T3FREE, THYROIDAB in the last 72 hours.  Invalid input(s): FREET3 Anemia work up: No results for input(s): VITAMINB12, FOLATE, FERRITIN, TIBC, IRON, RETICCTPCT in the last 72 hours. Sepsis Labs: Recent Labs  Lab 06/05/21 0330 06/06/21 0541 06/07/21 0710 06/08/21 0532  06/09/21 0539  PROCALCITON <0.10  --   --   --   --   WBC 6.4 10.9* 10.8* 11.2* 10.0    Microbiology Recent Results (from the past 240 hour(s))  Resp Panel by RT-PCR (Flu A&B, Covid) Nasopharyngeal Swab     Status: None   Collection Time: 06/04/21  4:57 PM   Specimen: Nasopharyngeal Swab; Nasopharyngeal(NP) swabs in vial transport medium  Result Value Ref Range Status   SARS Coronavirus 2 by RT PCR NEGATIVE NEGATIVE Final    Comment: (NOTE) SARS-CoV-2 target nucleic acids are NOT DETECTED.  The SARS-CoV-2 RNA is generally detectable in upper respiratory specimens during the acute phase of infection. The lowest concentration of SARS-CoV-2 viral copies this assay can detect is 138 copies/mL. A negative result does not preclude SARS-Cov-2 infection and should not be used as the sole basis for treatment or other patient management decisions. A negative result may occur with  improper specimen collection/handling, submission of specimen other than nasopharyngeal swab, presence of viral mutation(s) within the areas targeted by this assay, and inadequate number of viral copies(<138 copies/mL). A negative result must be combined with clinical observations, patient history, and epidemiological information. The expected result is Negative.  Fact Sheet for Patients:  EntrepreneurPulse.com.au  Fact Sheet for Healthcare Providers:  IncredibleEmployment.be  This test is no t yet approved or cleared by the [place] FDA and  has been authorized for  detection and/or diagnosis of SARS-CoV-2 by FDA under an Emergency Use Authorization (EUA). This EUA will remain  in effect (meaning this test can be used) for the duration of the COVID-19 declaration under Section 564(b)(1) of the Act, 21 U.S.C.section 360bbb-3(b)(1), unless the authorization is terminated  or revoked sooner.       Influenza A by PCR NEGATIVE NEGATIVE Final   Influenza B by PCR NEGATIVE NEGATIVE Final    Comment: (NOTE) The Xpert Xpress SARS-CoV-2/FLU/RSV plus assay is intended as an aid in the diagnosis of influenza from Nasopharyngeal swab specimens and should not be used as a sole basis for treatment. Nasal washings and aspirates are unacceptable for Xpert Xpress SARS-CoV-2/FLU/RSV testing.  Fact Sheet for Patients: EntrepreneurPulse.com.au  Fact Sheet for Healthcare Providers: IncredibleEmployment.be  This test is not yet approved or cleared by the [place] FDA and has been authorized for detection and/or diagnosis of SARS-CoV-2 by FDA under an Emergency Use Authorization (EUA). This EUA will remain in effect (meaning this test can be used) for the duration of the COVID-19 declaration under Section 564(b)(1) of the Act, 21 U.S.C. section 360bbb-3(b)(1), unless the authorization is terminated or revoked.  Performed at [facility], [address], [place], [place] [address]     Procedures and diagnostic studies:  No results found.             LOS: 5 days   Calloway Andrus  [facility]   Pager on [email or web address]. If 7PM-7AM, please contact night-coverage at [email or web address]     06/09/2021, 11:16 AM

## 2021-06-10 DIAGNOSIS — C259 Malignant neoplasm of pancreas, unspecified: Secondary | ICD-10-CM | POA: Diagnosis not present

## 2021-06-10 DIAGNOSIS — I2699 Other pulmonary embolism without acute cor pulmonale: Secondary | ICD-10-CM | POA: Diagnosis not present

## 2021-06-10 LAB — RESP PANEL BY RT-PCR (FLU A&B, COVID) ARPGX2
Influenza A by PCR: NEGATIVE
Influenza B by PCR: NEGATIVE
SARS Coronavirus 2 by RT PCR: NEGATIVE

## 2021-06-10 MED ORDER — LORAZEPAM 2 MG/ML IJ SOLN: 1.0000 mg | Freq: Four times a day (QID) | INTRAMUSCULAR | Status: DC | PRN

## 2021-06-10 NOTE — Progress Notes (Signed)
Pt's sister, Wynne Dust, notified of pt's transfer to [facility], [facility].

## 2021-06-10 NOTE — Progress Notes (Signed)
Physical Therapy Treatment Patient Details Name: Andrea Moreno MRN: [id number] DOB: 1940/12/05 Today's Date: 06/10/2021   History of Present Illness Andrea Moreno is a 81 y.o. female with medical history significant for  Pancreatic cancer (not yet on chemotherapy), essential hypertension, GERD who presents to the emergency department via EMS with complaint of 3 months of worsening chronic back pain with minimal to no relief on oral analgesics.  Back pain rapidly worsened within the past week, pain was midthoracic with radiation to the ribs (R > L).  This was associated with a decreased activity within past few weeks due to intensity of the back pain.  EMS was activated due to shortness of breath, on arrival of EMS team, patient was noted to be hypoxic in the 80s (patient does not use oxygen at baseline).  Supplemental oxygen via [place] at 2 LPM was provided with improvement in O2 sats, though she still complains of pleuritic chest pain which worsens with breathing and also complains of few days of constipation, but denies fever, chills, chest congestion, cough, numbness, tingling, urine/bowel incontinence.  Patient has a pending schedule to see hematology/oncology on Thursday (1/4)    PT Comments    Patient presents slightly confused and able to participate with therapy with frequent verbal/tactile cueing.  Patient required Max assist to don TLSO brace, demonstrates fair return for rolling to side and sitting up from side lying position and has increased tolerance for taking steps at bedside and marching in place.  Patient tolerated sitting up in chair after therapy - RN aware.  Patient will benefit from continued skilled physical therapy in hospital and recommended venue below to increase strength, balance, endurance for safe ADLs and gait.    Recommendations for follow up therapy are one component of a multi-disciplinary discharge planning process, led by the attending physician.  Recommendations  may be updated based on patient status, additional functional criteria and insurance authorization.  Follow Up Recommendations  Skilled nursing-short term rehab (<3 hours/day)     Assistance Recommended at Discharge Frequent or constant Supervision/Assistance  Patient can return home with the following A lot of help with walking and/or transfers;A lot of help with bathing/dressing/bathroom;Assistance with cooking/housework   Equipment Recommendations  None recommended by PT    Recommendations for Other Services       Precautions / Restrictions Precautions Precautions: Fall Precaution Comments: TLSO brace Required Braces or Orthoses: Spinal Brace Spinal Brace: Thoracolumbosacral orthotic Restrictions Weight Bearing Restrictions: No     Mobility  Bed Mobility Overal bed mobility: Needs Assistance Bed Mobility: Rolling;Sidelying to Sit Rolling: Mod assist Sidelying to sit: Mod assist       General bed mobility comments: increased time, labored movement    Transfers Overall transfer level: Needs assistance Equipment used: Rolling walker (2 wheels) Transfers: Sit to/from Stand;Bed to chair/wheelchair/BSC Sit to Stand: Min assist     Step pivot transfers: Mod assist     General transfer comment: slow labored movement, required repeated verbal/tactile cueing for safety    Ambulation/Gait Ambulation/Gait assistance: Mod assist Gait Distance (Feet): 10 Feet Assistive device: Rolling walker (2 wheels) Gait Pattern/deviations: Decreased step length - right;Decreased step length - left;Decreased stride length Gait velocity: decreased     General Gait Details: demonstrates slightly increased endurance/distance for taking steps at bedside, limited mostly due to fatigue and mild confusion   Stairs             Wheelchair Mobility    Modified Rankin (Stroke Patients Only)  Balance Overall balance assessment: Needs assistance Sitting-balance support:  Feet supported;No upper extremity supported Sitting balance-Leahy Scale: Fair Sitting balance - Comments: fair/good seated at EOB   Standing balance support: Reliant on assistive device for balance;During functional activity;Bilateral upper extremity supported Standing balance-Leahy Scale: Poor Standing balance comment: fair/poor using RW                            Cognition Arousal/Alertness: Awake/alert Behavior During Therapy: Restless Overall Cognitive Status: No family/caregiver present to determine baseline cognitive functioning                                          Exercises General Exercises - Lower Extremity Ankle Circles/Pumps: Seated;AROM;Strengthening;Both;5 reps Long Arc Quad: Seated;AROM;Strengthening;Both;10 reps Hip Flexion/Marching: Seated;AROM;AAROM;Both;Strengthening;5 reps    General Comments        Pertinent Vitals/Pain Pain Assessment: Faces Faces Pain Scale: Hurts a little bit Pain Location: low back Pain Descriptors / Indicators: Sore Pain Intervention(s): Limited activity within patient's tolerance;Monitored during session;Premedicated before session;Repositioned    Home Living                          Prior Function            PT Goals (current goals can now be found in the care plan section) Acute Rehab PT Goals Patient Stated Goal: return home with family to assist PT Goal Formulation: With patient Time For Goal Achievement: 06/20/21 Potential to Achieve Goals: Good Progress towards PT goals: Progressing toward goals    Frequency    Min 3X/week      PT Plan Current plan remains appropriate    Co-evaluation              AM-PAC PT "6 Clicks" Mobility   Outcome Measure  Help needed turning from your back to your side while in a flat bed without using bedrails?: A Lot Help needed moving from lying on your back to sitting on the side of a flat bed without using bedrails?: A Lot Help  needed moving to and from a bed to a chair (including a wheelchair)?: A Lot Help needed standing up from a chair using your arms (e.g., wheelchair or bedside chair)?: A Lot Help needed to walk in hospital room?: A Lot Help needed climbing 3-5 steps with a railing? : A Lot 6 Click Score: 12    End of Session   Activity Tolerance: Patient tolerated treatment well;Patient limited by fatigue Patient left: in chair;with call bell/phone within reach;with chair alarm set Nurse Communication: Mobility status PT Visit Diagnosis: Unsteadiness on feet (R26.81);Other abnormalities of gait and mobility (R26.89);Muscle weakness (generalized) (M62.81)     Time: 4665-9935 PT Time Calculation (min) (ACUTE ONLY): 22 min  Charges:  $Therapeutic Exercise: 8-22 mins $Therapeutic Activity: 8-22 mins                     1:48 PM, 06/10/21 Lonell Grandchild, MPT Physical Therapist with [facility] [phone] office [phone] mobile phone

## 2021-06-10 NOTE — TOC Transition Note (Signed)
Transition of Care [facility]) - CM/SW Discharge Note   Patient Details  Name: KEVONA LUPINACCI MRN: [id number] Date of Birth: April 10, 1941  Transition of Care [facility]) CM/SW Contact:  Shade Flood, LCSW Phone Number: 06/10/2021, 1:14 PM   Clinical Narrative:     Tawni Carnes has determined that pt is qualified for residential hospice and they can accept her today pending negative covid test. Updated pt's sister who is in agreement with this plan. DC clinical will be obtained electronically by [facility]. RN to call report. Once covid test results, will arrange EMS if it is negative.  Updated [facility] of pt's enrollment with [facility] so they could take pt off their waiting list for [facility].   There are no other TOC needs for dc.  Final next level of care: [place] Barriers to Discharge: Barriers Resolved   Patient Goals and CMS Choice Patient states their goals for this hospitalization and ongoing recovery are:: agreeable to residential hospice CMS Medicare.gov Compare Post Acute Care list provided to:: Patient Represenative (must comment) Choice offered to / list presented to : Sibling  Discharge Placement                Patient to be transferred to facility by: EMS Name of family member notified: Wynne Dust Patient and family notified of of transfer: 06/10/21  Discharge Plan and Services   Discharge Planning Services: CM Consult                                 Social Determinants of Health ([place]) Interventions     Readmission Risk Interventions No flowsheet data found.

## 2021-06-10 NOTE — Progress Notes (Signed)
Pt increasingly restless, calling out for help, crying in pain. Repositioned several times in last 2 hours by staff  for comfort. Dilaudid 0.5mg  IV given for back pain rated 10/10. Pt states, "I'm dying. Just let me go, I'm ready to go. Tell the doctor when you see him to just write me off." Pt reassured, repositioned. Pt took oral meds without difficulty. Took po fluids without difficulty, refuses breakfast at this time, states, "I'm not hungry."

## 2021-06-10 NOTE — Progress Notes (Signed)
Pt up in recliner with PT, back brace on. Pt tolerated well, remains up in recliner at this time, chair alarm on. Pt appears comfortable, no negative verbalization.

## 2021-06-10 NOTE — Progress Notes (Signed)
Pt discharged to [facility] via [facility] [facility] [employer]. [place] sent with patient. Hospice house notified of pt's transport.

## 2021-06-10 NOTE — Progress Notes (Signed)
[facility]) Hospital Liaison note   [place] is unable to offer a room today. Hospital Liaison will follow up tomorrow or sooner if a room becomes available. Please do not hesitate to call with questions.     Thank you for the opportunity to participate in this patient's care.   Daphene Calamity, MSW [facility] Liaison [phone]

## 2021-06-10 NOTE — Discharge Summary (Signed)
Physician Discharge Summary  Andrea Moreno WGN:[id number] DOB: 09/03/1940 DOA: 06/04/2021  PCP: Sharion Balloon, FNP  Admit date: 06/04/2021 Discharge date: 06/10/2021  Discharge disposition: Hospice house   Recommendations for Outpatient Follow-Up:   Follow-up with hospice team at the hospice house   Discharge Diagnosis:   Principal Problem:   Pulmonary embolism [facility]) Active Problems:   Essential hypertension   Chronic back pain   Spondylosis of lumbar spine   Pleural effusion on right   Atelectasis   Pancreatic cancer ([place])   Portal vein thrombosis   Liver lesion, left lobe   Splenic infarct   Acute respiratory failure with hypoxia (HCC)   Elevated d-dimer   Hypercalcemia   Elevated troponin   Elevated brain natriuretic peptide (BNP) level   Hyperglycemia   Acute pulmonary embolism (HCC)   Compression fracture of T10 vertebra ([place])   Pancreatic mass    Discharge Condition: Stable.  Diet recommendation:  Diet Order             Diet general           Diet Heart Room service appropriate? Yes; Fluid consistency: Thin  Diet effective now                     Code Status: DNR     Hospital Course:   Andrea Moreno is a 81 y.o. female with medical history significant for pancreatic cancer with hepatic lesions suspicious for metastatic disease, hypertension, GERD, who presented to the hospital with back pain and shortness of breath.  She has had chronic back pain for about 3 months now and has had no relief with analgesics at home.  However, she noticed significant worsening of her back pain for about 1 week prior to admission.  She was admitted to the hospital for acute pulmonary embolism complicated by acute hypoxemic respiratory failure.  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  She was treated with IV heparin and oxygen via nasal cannula.  She was transitioned from IV  heparin to Eliquis.  Hypoxia resolved and she was tolerating room air.  She was also found to have T10 compression fracture that was treated with analgesics and TLSO brace.  She was given IV fluids for hypercalcemia and poor oral intake.  Her troponins were mildly elevated and this was attributed to demand ischemia from acute pulmonary embolism.  She and her sister, Andrea Moreno, requested hospice for comfort measures.  She was evaluated by the hospice team.  Case was discussed with Dr. Ronne Binning, hospice medical director, today.  Patient is deemed to be a candidate for comfort measures at the hospice house.  She will be discharged to the hospice house today.  Discharge plan was discussed with the patient and her sister, Andrea Moreno.  Andrea Moreno wants all medications to be discontinued except medications for comfort.    Medical Consultants:   Oncologist Orthopedic surgeon   Discharge Exam:    Vitals:   06/09/21 1335 06/09/21 2121 06/10/21 0632 06/10/21 0907  BP: (!) 130/45 (!) 130/54 (!) 142/57 (!) 135/57  Pulse: 83 94 97 97  Resp: 20 19 18 20   Temp: 98.8 F (37.1 C) 98.9 F (37.2 C) 98.9 F (37.2 C)   TempSrc: Oral     SpO2: 95% 93% 98% 97%  Weight:   66.5 kg   Height:         GEN: NAD SKIN: Warm and  dry EYES: No pallor or icterus ENT: MMM CV: RRR PULM: CTA B ABD: soft, ND, NT, +BS CNS: Alert, oriented to person and place, non focal EXT: No edema or tenderness MSK: Lower thoracic spinal tenderness   The results of significant diagnostics from this hospitalization (including imaging, microbiology, ancillary and laboratory) are listed below for reference.     Procedures and Diagnostic Studies:   CT Angio Chest PE W and/or Wo Contrast  Result Date: 06/04/2021 CLINICAL DATA:  Epigastric pain.  Pancreatic malignancy. EXAM: CT ANGIOGRAPHY CHEST CT ABDOMEN AND PELVIS WITH CONTRAST TECHNIQUE: Multidetector CT imaging of the chest was performed using the standard protocol during bolus  administration of intravenous contrast. Multiplanar CT image reconstructions and MIPs were obtained to evaluate the vascular anatomy. Multidetector CT imaging of the abdomen and pelvis was performed using the standard protocol during bolus administration of intravenous contrast. CONTRAST:  25mL OMNIPAQUE IOHEXOL 350 MG/ML SOLN COMPARISON:  CT renal stone 05/21/2021. FINDINGS: CTA CHEST FINDINGS Cardiovascular: The heart and aorta are normal in size. There are atherosclerotic calcifications of the aorta. There is no pericardial effusion. There is adequate opacification of the pulmonary arteries to the segmental level. There are segmental and subsegmental right lower lobe pulmonary emboli. There are subsegmental right middle lobe pulmonary emboli. Mediastinum/Nodes: There is a moderate hiatal hernia. The esophagus is nondilated. There are no enlarged mediastinal or hilar lymph nodes. Lungs/Pleura: There is a trace right pleural effusion. There is bilateral lower lung atelectasis. There is a small amount of patchy airspace and ground-glass opacity in the right lower lobe. Trachea and central airways are patent. No pneumothorax. No suspicious pulmonary nodules. Musculoskeletal: No chest wall abnormality. No acute or significant osseous findings. There is moderate compression fracture of T10 which is new from the prior examination. No axillary lymphadenopathy. Review of the MIP images confirms the above findings. CT ABDOMEN and PELVIS FINDINGS Hepatobiliary: There is a 15 mm hypodense lesion in the left lobe of the liver image 2/19. There is a similar appearing hypodense lesion measuring 2 cm in the left lobe of the liver image 2/32. Small left hepatic cyst in the left lobe measures 9 mm. No gallstones are identified. Gallbladder sludge is likely present. No definite biliary ductal dilatation. Pancreas: Lobulated low-density mass in the region of the body and tail the pancreas measures 5.8 x 11.4 x 5.5 cm.  Low-attenuation lesion in the head of the pancreas versus dilated duct seen on image 2/26 measuring 15 mm. There is loss of fat plane between the lesser curvature of the stomach. This mass encompasses the celiac axis and its proximal branches, possibly causing occlusion. Superior mesenteric vein not definitely involved. 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 Splenic vein not visualized and may be occluded as well. Spleen: There is small wedge-shaped area of hypodensity in the spleen suspicious for infarct. No perisplenic fluid collection identified. The spleen is nonenlarged. Adrenals/Urinary Tract: The bilateral adrenal glands are within normal limits. There are punctate nonobstructing right renal calculi. There is no hydronephrosis in either kidney. There is a 5.2 by 3.8 cm cyst in the right kidney. There is an additional cortical hypodensity in the right kidney which is too small to characterize image 2/23. This likely also represents cyst. Bladder is grossly within normal limits, but evaluation is limited secondary to streak artifact in the pelvis. Stomach/Bowel: No evidence for bowel obstruction, free air or pneumatosis. There is a large amount of  stool throughout the colon. The appendix is not seen. Small bowel loops are within normal limits. Pancreatic mass abuts the body of the stomach with loss of fat plane. The stomach is otherwise normal. There is a moderate-sized hiatal hernia. Vascular/Lymphatic: Aorta and IVC are normal in size. There are atherosclerotic calcifications of the aorta. No discrete enlarged lymph nodes are identified. Reproductive: Uterus and bilateral adnexa are unremarkable. Other: There is a small amount of free fluid in the pelvis. There is a small fat containing umbilical hernia. Musculoskeletal: Degenerative changes affect the spine. No acute fractures are seen. No focal osseous lesions are seen. Right hip  arthroplasty is present. Review of the MIP images confirms the above findings. IMPRESSION: 1. Segmental and subsegmental right lower lobe pulmonary emboli. Subsegmental right middle lobe pulmonary emboli. (RV/LV Ratio = 1.0) 2. Trace right pleural effusion. 3. Minimal patchy airspace disease and ground-glass opacities in the right lower lobe suspicious for infection. 4. T10 compression fracture is new from May 21, 2021. 5. Large mass involving the pancreatic body and tail most compatible with primary pancreatic neoplasm. Questionable ductal dilatation in the head of the pancreas versus second smaller lesion. 6. 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 7. 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 8. Likely small splenic infarct. 9. There are 2 suspicious lesions in the left lobe of the liver, likely metastatic disease. 10. Nonobstructing right renal calculi. 11. Small amount of free fluid in the pelvis. 12. Large stool burden. 13.  Aortic Atherosclerosis (ICD10-I70.0). These results were called by telephone at the time of interpretation on 06/04/2021 at 7:28 pm to provider Wynona Dove , who verbally acknowledged these results. Electronically Signed   By: Ronney Asters M.D.   On: 06/04/2021 19:32   CT ABDOMEN PELVIS W CONTRAST  Result Date: 06/04/2021 CLINICAL DATA:  Epigastric pain.  Pancreatic malignancy. EXAM: CT ANGIOGRAPHY CHEST CT ABDOMEN AND PELVIS WITH CONTRAST TECHNIQUE: Multidetector CT imaging of the chest was performed using the standard protocol during bolus administration of intravenous contrast. Multiplanar CT image reconstructions and MIPs were obtained to evaluate the vascular anatomy. Multidetector CT imaging of the abdomen and pelvis was performed using the standard protocol during bolus administration of intravenous contrast. CONTRAST:  56mL OMNIPAQUE IOHEXOL 350 MG/ML SOLN COMPARISON:  CT  renal stone 05/21/2021. FINDINGS: CTA CHEST FINDINGS Cardiovascular: The heart and aorta are normal in size. There are atherosclerotic calcifications of the aorta. There is no pericardial effusion. There is adequate opacification of the pulmonary arteries to the segmental level. There are segmental and subsegmental right lower lobe pulmonary emboli. There are subsegmental right middle lobe pulmonary emboli. Mediastinum/Nodes: There is a moderate hiatal hernia. The esophagus is nondilated. There are no enlarged mediastinal or hilar lymph nodes. Lungs/Pleura: There is a trace right pleural effusion. There is bilateral lower lung atelectasis. There is a small amount of patchy airspace and ground-glass opacity in the right lower lobe. Trachea and central airways are patent. No pneumothorax. No suspicious pulmonary nodules. Musculoskeletal: No chest wall abnormality. No acute or significant osseous findings. There is moderate compression fracture of T10 which is new from the prior examination. No axillary lymphadenopathy. Review of the MIP images confirms the above findings. CT ABDOMEN and PELVIS FINDINGS Hepatobiliary: There is a 15 mm hypodense lesion in the left lobe of the liver image 2/19. There is a similar appearing hypodense lesion measuring 2 cm in the left lobe of  the liver image 2/32. Small left hepatic cyst in the left lobe measures 9 mm. No gallstones are identified. Gallbladder sludge is likely present. No definite biliary ductal dilatation. Pancreas: Lobulated low-density mass in the region of the body and tail the pancreas measures 5.8 x 11.4 x 5.5 cm. Low-attenuation lesion in the head of the pancreas versus dilated duct seen on image 2/26 measuring 15 mm. There is loss of fat plane between the lesser curvature of the stomach. This mass encompasses the celiac axis and its proximal branches, possibly causing occlusion. Superior mesenteric vein not definitely involved. There is thrombosis of the main  portal vein and portal confluence as well as distal superior mesenteric vein. Splenic vein not visualized and may be occluded as well. Spleen: There is small wedge-shaped area of hypodensity in the spleen suspicious for infarct. No perisplenic fluid collection identified. The spleen is nonenlarged. Adrenals/Urinary Tract: The bilateral adrenal glands are within normal limits. There are punctate nonobstructing right renal calculi. There is no hydronephrosis in either kidney. There is a 5.2 by 3.8 cm cyst in the right kidney. There is an additional cortical hypodensity in the right kidney which is too small to characterize image 2/23. This likely also represents cyst. Bladder is grossly within normal limits, but evaluation is limited secondary to streak artifact in the pelvis. Stomach/Bowel: No evidence for bowel obstruction, free air or pneumatosis. There is a large amount of stool throughout the colon. The appendix is not seen. Small bowel loops are within normal limits. Pancreatic mass abuts the body of the stomach with loss of fat plane. The stomach is otherwise normal. There is a moderate-sized hiatal hernia. Vascular/Lymphatic: Aorta and IVC are normal in size. There are atherosclerotic calcifications of the aorta. No discrete enlarged lymph nodes are identified. Reproductive: Uterus and bilateral adnexa are unremarkable. Other: There is a small amount of free fluid in the pelvis. There is a small fat containing umbilical hernia. Musculoskeletal: Degenerative changes affect the spine. No acute fractures are seen. No focal osseous lesions are seen. Right hip arthroplasty is present. Review of the MIP images confirms the above findings. IMPRESSION: 1. Segmental and subsegmental right lower lobe pulmonary emboli. Subsegmental right middle lobe pulmonary emboli. (RV/LV Ratio = 1.0) 2. Trace right pleural effusion. 3. Minimal patchy airspace disease and ground-glass opacities in the right lower lobe suspicious for  infection. 4. T10 compression fracture is new from May 21, 2021. 5. Large mass involving the pancreatic body and tail most compatible with primary pancreatic neoplasm. Questionable ductal dilatation in the head of the pancreas versus second smaller lesion. 6. 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 7. 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 8. Likely small splenic infarct. 9. There are 2 suspicious lesions in the left lobe of the liver, likely metastatic disease. 10. Nonobstructing right renal calculi. 11. Small amount of free fluid in the pelvis. 12. Large stool burden. 13.  Aortic Atherosclerosis (ICD10-I70.0). These results were called by telephone at the time of interpretation on 06/04/2021 at 7:28 pm to provider Wynona Dove , who verbally acknowledged these results. Electronically Signed   By: Ronney Asters M.D.   On: 06/04/2021 19:32   CT T-SPINE NO CHARGE  Result Date: 06/04/2021 CLINICAL DATA:  Initial evaluation for acute trauma, fall. EXAM: CT Thoracic and Lumbar spine  Contrast TECHNIQUE: Multiplanar CT images of the thoracic and lumbar spine were reconstructed from contemporary CT of  the Chest, Abdomen, and Pelvis CONTRAST:  None or No additional COMPARISON:  Comparison made with concomitant CT of the chest, abdomen, and pelvis. FINDINGS: CT THORACIC SPINE FINDINGS Alignment: Exaggeration of the normal thoracic kyphosis. No listhesis. Vertebrae: There is an acute compression fracture involving the T10 vertebral body. Associated height loss measures up to 50% with 3 mm bony retropulsion. No significant stenosis. Otherwise, vertebral body height maintained with no other acute or chronic fracture. Visualized ribs intact. No worrisome osseous lesions. Paraspinal and other soft tissues: Paraspinous soft tissues demonstrate no acute finding. Small layering right pleural effusion with associated  atelectasis. Previously identified pancreatic neoplasm partially visualize, better evaluated on concomitant CT of the abdomen and pelvis. Hiatal hernia noted. Scattered aortic atherosclerosis. Acute pulmonary embolism within right lower lobe segmental branches, also better evaluated on concomitant chest CT. Disc levels: No significant disc pathology or stenosis seen within the thoracic spine. CT LUMBAR SPINE FINDINGS Segmentation: Standard. Lowest well-formed disc space labeled the L5-S1 level. Alignment: Moderate levoscoliosis with apex at L2-3. Alignment otherwise normal preservation of the normal lumbar lordosis. Vertebrae: Vertebral body height maintained without acute or chronic fracture. Visualized sacrum and pelvis intact. No worrisome osseous lesions. Paraspinal and other soft tissues: Paraspinous soft tissues demonstrate no acute finding. 5.4 cm parapelvic cyst noted within the right kidney. Nonobstructive right renal nephrolithiasis noted. Aorto bi-iliac atherosclerotic disease. Disc levels: Mild multilevel degenerative spondylosis, most pronounced at L1-2, L4-5, and L5-S1. Lower lumbar facet hypertrophy. No significant spinal stenosis. Moderate bilateral L5 foraminal narrowing. IMPRESSION: CT THORACIC SPINE IMPRESSION: 1. Compression fracture involving the T10 vertebral body with up to 50% height loss and 3 mm bony retropulsion, new as compared to 05/21/2021, consistent with an acute/subacute finding. No significant stenosis. 2. No other acute traumatic injury within the thoracic spine. 3. Acute right-sided pulmonary emboli, better evaluated on concomitant CT of the chest. 4. Small layering right pleural effusion with associated atelectasis. CT LUMBAR SPINE IMPRESSION: 1. No acute traumatic injury within the lumbar spine. 2. Moderate levoscoliosis with apex at L2-3. 3. Mild multilevel degenerative spondylosis, most pronounced at L1-2, L4-5, and L5-S1. Moderate bilateral L5 foraminal narrowing. 4.  Nonobstructive right renal nephrolithiasis. 5. Aortic Atherosclerosis (ICD10-I70.0). Results regarding the acute pulmonary emboli were communicated to the ordering clinician by Dr. Ronney Asters, documented on corresponding CTA of the chest. Electronically Signed   By: Jeannine Boga M.D.   On: 06/04/2021 19:45   CT L-SPINE NO CHARGE  Result Date: 06/04/2021 CLINICAL DATA:  Initial evaluation for acute trauma, fall. EXAM: CT Thoracic and Lumbar spine  Contrast TECHNIQUE: Multiplanar CT images of the thoracic and lumbar spine were reconstructed from contemporary CT of the Chest, Abdomen, and Pelvis CONTRAST:  None or No additional COMPARISON:  Comparison made with concomitant CT of the chest, abdomen, and pelvis. FINDINGS: CT THORACIC SPINE FINDINGS Alignment: Exaggeration of the normal thoracic kyphosis. No listhesis. Vertebrae: There is an acute compression fracture involving the T10 vertebral body. Associated height loss measures up to 50% with 3 mm bony retropulsion. No significant stenosis. Otherwise, vertebral body height maintained with no other acute or chronic fracture. Visualized ribs intact. No worrisome osseous lesions. Paraspinal and other soft tissues: Paraspinous soft tissues demonstrate no acute finding. Small layering right pleural effusion with associated atelectasis. Previously identified pancreatic neoplasm partially visualize, better evaluated on concomitant CT of the abdomen and pelvis. Hiatal hernia noted. Scattered aortic atherosclerosis. Acute pulmonary embolism within right lower lobe segmental branches, also better evaluated on concomitant chest CT. Disc  levels: No significant disc pathology or stenosis seen within the thoracic spine. CT LUMBAR SPINE FINDINGS Segmentation: Standard. Lowest well-formed disc space labeled the L5-S1 level. Alignment: Moderate levoscoliosis with apex at L2-3. Alignment otherwise normal preservation of the normal lumbar lordosis. Vertebrae: Vertebral  body height maintained without acute or chronic fracture. Visualized sacrum and pelvis intact. No worrisome osseous lesions. Paraspinal and other soft tissues: Paraspinous soft tissues demonstrate no acute finding. 5.4 cm parapelvic cyst noted within the right kidney. Nonobstructive right renal nephrolithiasis noted. Aorto bi-iliac atherosclerotic disease. Disc levels: Mild multilevel degenerative spondylosis, most pronounced at L1-2, L4-5, and L5-S1. Lower lumbar facet hypertrophy. No significant spinal stenosis. Moderate bilateral L5 foraminal narrowing. IMPRESSION: CT THORACIC SPINE IMPRESSION: 1. Compression fracture involving the T10 vertebral body with up to 50% height loss and 3 mm bony retropulsion, new as compared to 05/21/2021, consistent with an acute/subacute finding. No significant stenosis. 2. No other acute traumatic injury within the thoracic spine. 3. Acute right-sided pulmonary emboli, better evaluated on concomitant CT of the chest. 4. Small layering right pleural effusion with associated atelectasis. CT LUMBAR SPINE IMPRESSION: 1. No acute traumatic injury within the lumbar spine. 2. Moderate levoscoliosis with apex at L2-3. 3. Mild multilevel degenerative spondylosis, most pronounced at L1-2, L4-5, and L5-S1. Moderate bilateral L5 foraminal narrowing. 4. Nonobstructive right renal nephrolithiasis. 5. Aortic Atherosclerosis (ICD10-I70.0). Results regarding the acute pulmonary emboli were communicated to the ordering clinician by Dr. Ronney Asters, documented on corresponding CTA of the chest. Electronically Signed   By: Jeannine Boga M.D.   On: 06/04/2021 19:45   DG Chest Port 1 View  Result Date: 06/04/2021 CLINICAL DATA:  Shortness of breath.  Back pain. EXAM: PORTABLE CHEST 1 VIEW COMPARISON:  Two-view chest x-ray 01/22/2016 FINDINGS: Heart size upper limits of normal, exaggerated by low lung volumes. Atherosclerotic changes are present in the arch. Mild atelectasis or scarring is  present both bases. No edema or effusion is present airspace consolidation is present. Bony thorax is within normal limits. IMPRESSION: 1. Low lung volumes. 2. Mild bibasilar atelectasis or scarring. 3. Atherosclerosis. Electronically Signed   By: San Morelle M.D.   On: 06/04/2021 17:44   ECHOCARDIOGRAM COMPLETE  Result Date: 06/05/2021    ECHOCARDIOGRAM REPORT   Patient Name:   Andrea Moreno Date of Exam: 06/05/2021 Medical Rec #:  [id number]         Height:       62.0 in Accession #:    [id number]        Weight:       137.6 lb Date of Birth:  [birth year]         BSA:          1.631 m Patient Age:    85 years          BP:           136/52 mmHg Patient Gender: F                 HR:           88 bpm. Exam Location:  [facility] Procedure: 2D Echo, Cardiac Doppler and Color Doppler Indications:    Pulmonary Embolus  History:        Patient has prior history of Echocardiogram examinations, most                 recent 10/15/2012. TIA, Arrythmias:Bradycardia; Risk  Factors:Hypertension, Dyslipidemia and Former Smoker.  Sonographer:    Wenda Low Referring Phys: [id number] OLADAPO ADEFESO IMPRESSIONS  1. Left ventricular ejection fraction, by estimation, is 60 to 65%. The left ventricle has normal function. The left ventricle has no regional wall motion abnormalities. Left ventricular diastolic parameters are consistent with Grade I diastolic dysfunction (impaired relaxation).  2. Right ventricular systolic function is normal. The right ventricular size is normal. Tricuspid regurgitation signal is inadequate for assessing PA pressure.  3. Left atrial size was mildly dilated.  4. The mitral valve is normal in structure. No evidence of mitral valve regurgitation. No evidence of mitral stenosis.  5. The aortic valve is tricuspid. Aortic valve regurgitation is trivial. No aortic stenosis is present. FINDINGS  Left Ventricle: Left ventricular ejection fraction, by estimation, is 60 to 65%. The left  ventricle has normal function. The left ventricle has no regional wall motion abnormalities. The left ventricular internal cavity size was normal in size. There is  no left ventricular hypertrophy. Left ventricular diastolic parameters are consistent with Grade I diastolic dysfunction (impaired relaxation). Normal left ventricular filling pressure. Right Ventricle: The right ventricular size is normal. No increase in right ventricular wall thickness. Right ventricular systolic function is normal. Tricuspid regurgitation signal is inadequate for assessing PA pressure. Left Atrium: Left atrial size was mildly dilated. Right Atrium: Right atrial size was normal in size. Pericardium: There is no evidence of pericardial effusion. Mitral Valve: The mitral valve is normal in structure. No evidence of mitral valve regurgitation. No evidence of mitral valve stenosis. MV peak gradient, 4.5 mmHg. The mean mitral valve gradient is 2.0 mmHg. Tricuspid Valve: The tricuspid valve is normal in structure. Tricuspid valve regurgitation is trivial. No evidence of tricuspid stenosis. Aortic Valve: The aortic valve is tricuspid. Aortic valve regurgitation is trivial. No aortic stenosis is present. Aortic valve mean gradient measures 4.0 mmHg. Aortic valve peak gradient measures 10.6 mmHg. Aortic valve area, by VTI measures 2.10 cm. Pulmonic Valve: The pulmonic valve was not well visualized. Pulmonic valve regurgitation is not visualized. No evidence of pulmonic stenosis. Aorta: The aortic root is normal in size and structure. Venous: The inferior vena cava was not well visualized. IAS/Shunts: The interatrial septum was not well visualized.  LEFT VENTRICLE PLAX 2D LVIDd:         4.40 cm     Diastology LVIDs:         2.90 cm     LV e' medial:    7.40 cm/s LV PW:         1.00 cm     LV E/e' medial:  9.7 LV IVS:        1.00 cm     LV e' lateral:   5.66 cm/s LVOT diam:     1.90 cm     LV E/e' lateral: 12.7 LV SV:         64 LV SV Index:    39 LVOT Area:     2.84 cm  LV Volumes (MOD) LV vol d, MOD A2C: 40.0 ml LV vol d, MOD A4C: 33.3 ml LV vol s, MOD A2C: 17.2 ml LV vol s, MOD A4C: 14.1 ml LV SV MOD A2C:     22.8 ml LV SV MOD A4C:     33.3 ml LV SV MOD BP:      21.6 ml RIGHT VENTRICLE RV Basal diam:  3.25 cm RV Mid diam:    2.90 cm RV S prime:  14.10 cm/s TAPSE (M-mode): 3.7 cm LEFT ATRIUM             Index        RIGHT ATRIUM           Index LA diam:        4.10 cm 2.51 cm/m   RA Area:     11.90 cm LA Vol (A2C):   60.8 ml 37.29 ml/m  RA Volume:   27.50 ml  16.86 ml/m LA Vol (A4C):   57.4 ml 35.20 ml/m LA Biplane Vol: 59.4 ml 36.43 ml/m  AORTIC VALVE                    PULMONIC VALVE AV Area (Vmax):    2.10 cm     PV Vmax:       1.14 m/s AV Area (Vmean):   2.06 cm     PV Peak grad:  5.2 mmHg AV Area (VTI):     2.10 cm AV Vmax:           163.00 cm/s AV Vmean:          92.700 cm/s AV VTI:            0.306 m AV Peak Grad:      10.6 mmHg AV Mean Grad:      4.0 mmHg LVOT Vmax:         121.00 cm/s LVOT Vmean:        67.400 cm/s LVOT VTI:          0.227 m LVOT/AV VTI ratio: 0.74  AORTA Ao Root diam: 3.00 cm MITRAL VALVE MV Area (PHT): 3.33 cm    SHUNTS MV Area VTI:   2.43 cm    Systemic VTI:  0.23 m MV Peak grad:  4.5 mmHg    Systemic Diam: 1.90 cm MV Mean grad:  2.0 mmHg MV Vmax:       1.06 m/s MV Vmean:      56.4 cm/s MV Decel Time: 228 msec MV E velocity: 71.60 cm/s MV A velocity: 97.70 cm/s MV E/A ratio:  0.73 Carlyle Dolly MD Electronically signed by Carlyle Dolly MD Signature Date/Time: 06/05/2021/3:20:32 PM    Final      Labs:   Basic Metabolic Panel: Recent Labs  Lab 06/04/21 1645 06/05/21 0330 06/06/21 0541  NA 134* 136 135  K 4.2 3.9 3.8  CL 103 107 105  CO2 22 23 21*  GLUCOSE 106* 115* 97  BUN 25* 21 14  CREATININE 0.89 0.81 0.76  CALCIUM 12.6* 12.1* 12.1*  MG  --  1.7  --   PHOS  --  2.1*  --    GFR Estimated Creatinine Clearance: 50.2 mL/min (by C-G formula based on SCr of 0.76 mg/dL). Liver Function  Tests: Recent Labs  Lab 06/04/21 1645 06/05/21 0330  AST 42* 32  ALT 46* 39  ALKPHOS 58 53  BILITOT 0.9 0.6  PROT 6.7 5.6*  ALBUMIN 3.3* 2.8*   No results for input(s): LIPASE, AMYLASE in the last 168 hours. No results for input(s): AMMONIA in the last 168 hours. Coagulation profile No results for input(s): INR, PROTIME in the last 168 hours.  CBC: Recent Labs  Lab 06/04/21 1645 06/05/21 0330 06/06/21 0541 06/07/21 0710 06/08/21 0532 06/09/21 0539  WBC 9.2 6.4 10.9* 10.8* 11.2* 10.0  NEUTROABS 7.0  --   --   --   --   --   HGB 9.2* 8.9* 9.2* 8.6* 8.5*  8.3*  HCT 28.9* 28.3* 29.2* 27.0* 26.1* 25.0*  MCV 87.3 88.4 88.8 87.4 86.7 85.9  PLT 170 161 188 214 228 210   Cardiac Enzymes: No results for input(s): CKTOTAL, CKMB, CKMBINDEX, TROPONINI in the last 168 hours. BNP: Invalid input(s): POCBNP CBG: No results for input(s): GLUCAP in the last 168 hours. D-Dimer No results for input(s): DDIMER in the last 72 hours. Hgb A1c No results for input(s): HGBA1C in the last 72 hours. Lipid Profile No results for input(s): CHOL, HDL, LDLCALC, TRIG, CHOLHDL, LDLDIRECT in the last 72 hours. Thyroid function studies No results for input(s): TSH, T4TOTAL, T3FREE, THYROIDAB in the last 72 hours.  Invalid input(s): FREET3 Anemia work up No results for input(s): VITAMINB12, FOLATE, FERRITIN, TIBC, IRON, RETICCTPCT in the last 72 hours. Microbiology Recent Results (from the past 240 hour(s))  Resp Panel by RT-PCR (Flu A&B, Covid) Nasopharyngeal Swab     Status: None   Collection Time: 06/04/21  4:57 PM   Specimen: Nasopharyngeal Swab; Nasopharyngeal(NP) swabs in vial transport medium  Result Value Ref Range Status   SARS Coronavirus 2 by RT PCR NEGATIVE NEGATIVE Final    Comment: (NOTE) SARS-CoV-2 target nucleic acids are NOT DETECTED.  The SARS-CoV-2 RNA is generally detectable in upper respiratory specimens during the acute phase of infection. The lowest concentration of  SARS-CoV-2 viral copies this assay can detect is 138 copies/mL. A negative result does not preclude SARS-Cov-2 infection and should not be used as the sole basis for treatment or other patient management decisions. A negative result may occur with  improper specimen collection/handling, submission of specimen other than nasopharyngeal swab, presence of viral mutation(s) within the areas targeted by this assay, and inadequate number of viral copies(<138 copies/mL). A negative result must be combined with clinical observations, patient history, and epidemiological information. The expected result is Negative.  Fact Sheet for Patients:  EntrepreneurPulse.com.au  Fact Sheet for Healthcare Providers:  IncredibleEmployment.be  This test is no t yet approved or cleared by the [place] FDA and  has been authorized for detection and/or diagnosis of SARS-CoV-2 by FDA under an Emergency Use Authorization (EUA). This EUA will remain  in effect (meaning this test can be used) for the duration of the COVID-19 declaration under Section 564(b)(1) of the Act, 21 U.S.C.section 360bbb-3(b)(1), unless the authorization is terminated  or revoked sooner.       Influenza A by PCR NEGATIVE NEGATIVE Final   Influenza B by PCR NEGATIVE NEGATIVE Final    Comment: (NOTE) The Xpert Xpress SARS-CoV-2/FLU/RSV plus assay is intended as an aid in the diagnosis of influenza from Nasopharyngeal swab specimens and should not be used as a sole basis for treatment. Nasal washings and aspirates are unacceptable for Xpert Xpress SARS-CoV-2/FLU/RSV testing.  Fact Sheet for Patients: EntrepreneurPulse.com.au  Fact Sheet for Healthcare Providers: IncredibleEmployment.be  This test is not yet approved or cleared by the [place] FDA and has been authorized for detection and/or diagnosis of SARS-CoV-2 by FDA under an Emergency Use  Authorization (EUA). This EUA will remain in effect (meaning this test can be used) for the duration of the COVID-19 declaration under Section 564(b)(1) of the Act, 21 U.S.C. section 360bbb-3(b)(1), unless the authorization is terminated or revoked.  Performed at [facility], [address], [place], [place] [address]      Discharge Instructions:   Discharge Instructions     Diet general   Complete by: As directed       Allergies as of 06/10/2021  Reactions   Celebrex [celecoxib]    rash   Demerol [meperidine] Hypertension   Lisinopril    Cough   Statins    Muscle aches   Zyrtec [cetirizine] Itching        Medication List     STOP taking these medications    acetaminophen 650 MG CR tablet Commonly known as: TYLENOL   aspirin 325 MG EC tablet   CITRACAL PO   COLLAGEN 1500/C PO   doxepin 25 MG capsule Commonly known as: SINEQUAN   losartan 100 MG tablet Commonly known as: COZAAR   megestrol 400 MG/10ML suspension Commonly known as: MEGACE   multivitamin capsule   niacin 500 MG tablet   OVER THE COUNTER MEDICATION   oxycodone 5 MG capsule Commonly known as: OXY-IR   polyethylene glycol powder 17 GM/SCOOP powder Commonly known as: GLYCOLAX/MIRALAX   Potassium Gluconate 550 (90 K) MG Tabs   raloxifene 60 MG tablet Commonly known as: EVISTA       TAKE these medications    linaclotide 290 MCG Caps capsule Commonly known as: Linzess Take 1 capsule (290 mcg total) by mouth daily before breakfast.   ondansetron 4 MG tablet Commonly known as: Zofran Take 1 tablet (4 mg total) by mouth every 8 (eight) hours as needed for nausea or vomiting.   pantoprazole 40 MG tablet Commonly known as: PROTONIX Take 1 tablet (40 mg total) by mouth daily. For stomach           If you experience worsening of your admission symptoms, develop shortness of breath, life threatening emergency, suicidal or homicidal thoughts you must seek medical  attention immediately by calling 911 or calling your MD immediately  if symptoms less severe.   You must read complete instructions/literature along with all the possible adverse reactions/side effects for all the medicines you take and that have been prescribed to you. Take any new medicines after you have completely understood and accept all the possible adverse reactions/side effects.    Please note   You were cared for by a hospitalist during your hospital stay. If you have any questions about your discharge medications or the care you received while you were in the hospital after you are discharged, you can call the unit and asked to speak with the hospitalist on call if the hospitalist that took care of you is not available. Once you are discharged, your primary care physician will handle any further medical issues. Please note that NO REFILLS for any discharge medications will be authorized once you are discharged, as it is imperative that you return to your primary care physician (or establish a relationship with a primary care physician if you do not have one) for your aftercare needs so that they can reassess your need for medications and monitor your lab values.       Time coordinating discharge: 35 minutes  Signed:  Waleed Dettman  [facility] 06/10/2021, 12:36 PM   Pager on [email or web address]. If 7PM-7AM, please contact night-coverage at [email or web address]

## 2021-06-11 LAB — CHROMOGRANIN A: Chromogranin A (ng/mL): 119.5 ng/mL — ABNORMAL HIGH (ref 0.0–101.8)

## 2021-07-03 DEATH — deceased
